# Patient Record
Sex: Male | Born: 1945
Health system: Southern US, Community
[De-identification: ages and names within clinical notes are randomized; demographics above are authoritative.]

## PROBLEM LIST (undated history)

## (undated) DIAGNOSIS — Z87442 Personal history of urinary calculi: Secondary | ICD-10-CM

## (undated) DIAGNOSIS — M1990 Unspecified osteoarthritis, unspecified site: Secondary | ICD-10-CM

## (undated) DIAGNOSIS — E278 Other specified disorders of adrenal gland: Secondary | ICD-10-CM

## (undated) DIAGNOSIS — I5030 Unspecified diastolic (congestive) heart failure: Secondary | ICD-10-CM

## (undated) DIAGNOSIS — C801 Malignant (primary) neoplasm, unspecified: Secondary | ICD-10-CM

## (undated) DIAGNOSIS — B0053 Herpesviral conjunctivitis: Secondary | ICD-10-CM

## (undated) DIAGNOSIS — N189 Chronic kidney disease, unspecified: Secondary | ICD-10-CM

## (undated) DIAGNOSIS — I10 Essential (primary) hypertension: Secondary | ICD-10-CM

## (undated) DIAGNOSIS — L309 Dermatitis, unspecified: Secondary | ICD-10-CM

## (undated) HISTORY — PX: HERNIA REPAIR: SHX51

## (undated) HISTORY — PX: TONSILLECTOMY: SUR1361

## (undated) SURGERY — Surgical Case
Anesthesia: *Unknown

---

## 1972-11-07 HISTORY — PX: PILONIDAL CYST EXCISION: SHX744

## 1984-11-07 HISTORY — PX: CORNEAL TRANSPLANT: SHX108

## 2011-02-24 ENCOUNTER — Other Ambulatory Visit: Payer: Self-pay | Admitting: Family Medicine

## 2011-02-24 DIAGNOSIS — R1032 Left lower quadrant pain: Secondary | ICD-10-CM

## 2011-03-01 ENCOUNTER — Ambulatory Visit
Admission: RE | Admit: 2011-03-01 | Discharge: 2011-03-01 | Disposition: A | Discharge status: Home / Self Care | Payer: Managed Care, Other (non HMO) | Source: Ambulatory Visit | Attending: Family Medicine | Admitting: Family Medicine

## 2011-03-01 DIAGNOSIS — R1032 Left lower quadrant pain: Secondary | ICD-10-CM

## 2011-03-01 MED ORDER — IOHEXOL 300 MG/ML  SOLN
100.0000 mL | Freq: Once | INTRAMUSCULAR | Status: AC | PRN
  Administered 2011-03-01: 100 mL via INTRAVENOUS

## 2011-11-17 DIAGNOSIS — I10 Essential (primary) hypertension: Secondary | ICD-10-CM | POA: Diagnosis not present

## 2011-11-17 DIAGNOSIS — M25569 Pain in unspecified knee: Secondary | ICD-10-CM | POA: Diagnosis not present

## 2011-11-26 DIAGNOSIS — IMO0002 Reserved for concepts with insufficient information to code with codable children: Secondary | ICD-10-CM | POA: Diagnosis not present

## 2011-11-26 DIAGNOSIS — M1710 Unilateral primary osteoarthritis, unspecified knee: Secondary | ICD-10-CM | POA: Diagnosis not present

## 2012-05-28 DIAGNOSIS — I10 Essential (primary) hypertension: Secondary | ICD-10-CM | POA: Diagnosis not present

## 2012-05-28 DIAGNOSIS — L509 Urticaria, unspecified: Secondary | ICD-10-CM | POA: Diagnosis not present

## 2012-07-30 DIAGNOSIS — M999 Biomechanical lesion, unspecified: Secondary | ICD-10-CM | POA: Diagnosis not present

## 2012-07-30 DIAGNOSIS — IMO0002 Reserved for concepts with insufficient information to code with codable children: Secondary | ICD-10-CM | POA: Diagnosis not present

## 2012-07-30 DIAGNOSIS — M5137 Other intervertebral disc degeneration, lumbosacral region: Secondary | ICD-10-CM | POA: Diagnosis not present

## 2012-07-31 DIAGNOSIS — IMO0002 Reserved for concepts with insufficient information to code with codable children: Secondary | ICD-10-CM | POA: Diagnosis not present

## 2012-07-31 DIAGNOSIS — M999 Biomechanical lesion, unspecified: Secondary | ICD-10-CM | POA: Diagnosis not present

## 2012-07-31 DIAGNOSIS — M5137 Other intervertebral disc degeneration, lumbosacral region: Secondary | ICD-10-CM | POA: Diagnosis not present

## 2012-08-01 DIAGNOSIS — M5137 Other intervertebral disc degeneration, lumbosacral region: Secondary | ICD-10-CM | POA: Diagnosis not present

## 2012-08-01 DIAGNOSIS — M999 Biomechanical lesion, unspecified: Secondary | ICD-10-CM | POA: Diagnosis not present

## 2012-08-01 DIAGNOSIS — IMO0002 Reserved for concepts with insufficient information to code with codable children: Secondary | ICD-10-CM | POA: Diagnosis not present

## 2012-08-02 DIAGNOSIS — IMO0002 Reserved for concepts with insufficient information to code with codable children: Secondary | ICD-10-CM | POA: Diagnosis not present

## 2012-08-02 DIAGNOSIS — M999 Biomechanical lesion, unspecified: Secondary | ICD-10-CM | POA: Diagnosis not present

## 2012-08-02 DIAGNOSIS — M5137 Other intervertebral disc degeneration, lumbosacral region: Secondary | ICD-10-CM | POA: Diagnosis not present

## 2012-08-06 DIAGNOSIS — IMO0002 Reserved for concepts with insufficient information to code with codable children: Secondary | ICD-10-CM | POA: Diagnosis not present

## 2012-08-06 DIAGNOSIS — M999 Biomechanical lesion, unspecified: Secondary | ICD-10-CM | POA: Diagnosis not present

## 2012-08-06 DIAGNOSIS — M5137 Other intervertebral disc degeneration, lumbosacral region: Secondary | ICD-10-CM | POA: Diagnosis not present

## 2012-08-09 DIAGNOSIS — IMO0002 Reserved for concepts with insufficient information to code with codable children: Secondary | ICD-10-CM | POA: Diagnosis not present

## 2012-08-09 DIAGNOSIS — M5137 Other intervertebral disc degeneration, lumbosacral region: Secondary | ICD-10-CM | POA: Diagnosis not present

## 2012-08-09 DIAGNOSIS — M999 Biomechanical lesion, unspecified: Secondary | ICD-10-CM | POA: Diagnosis not present

## 2012-08-13 DIAGNOSIS — IMO0002 Reserved for concepts with insufficient information to code with codable children: Secondary | ICD-10-CM | POA: Diagnosis not present

## 2012-08-13 DIAGNOSIS — M5137 Other intervertebral disc degeneration, lumbosacral region: Secondary | ICD-10-CM | POA: Diagnosis not present

## 2012-08-13 DIAGNOSIS — M999 Biomechanical lesion, unspecified: Secondary | ICD-10-CM | POA: Diagnosis not present

## 2012-08-16 DIAGNOSIS — IMO0002 Reserved for concepts with insufficient information to code with codable children: Secondary | ICD-10-CM | POA: Diagnosis not present

## 2012-08-16 DIAGNOSIS — M999 Biomechanical lesion, unspecified: Secondary | ICD-10-CM | POA: Diagnosis not present

## 2012-08-16 DIAGNOSIS — M5137 Other intervertebral disc degeneration, lumbosacral region: Secondary | ICD-10-CM | POA: Diagnosis not present

## 2012-08-20 DIAGNOSIS — M999 Biomechanical lesion, unspecified: Secondary | ICD-10-CM | POA: Diagnosis not present

## 2012-08-20 DIAGNOSIS — M5137 Other intervertebral disc degeneration, lumbosacral region: Secondary | ICD-10-CM | POA: Diagnosis not present

## 2012-08-20 DIAGNOSIS — IMO0002 Reserved for concepts with insufficient information to code with codable children: Secondary | ICD-10-CM | POA: Diagnosis not present

## 2013-01-22 IMAGING — CT CT PELVIS W/ CM
2 series · 15 of 36 positions shown, 19 images · IV contrast (READICAT & [ID] OMNI 300)
Comparison: None

CLINICAL DATA: Left inguinal pain.  History of previous hernia
repairs.

CT PELVIS WITH CONTRAST
TECHNIQUE: Multidetector CT imaging of the pelvis was performed
using the standard protocol following the bolus administration of
intravenous contrast.
Contrast:  100 ml [id number].

[Series 2: routine pelvis · axial · 0.78mm/px · z∈[-271,-11]mm · 14 of 57 slices shown, 17 images]
[im 4/57  soft-tissue]
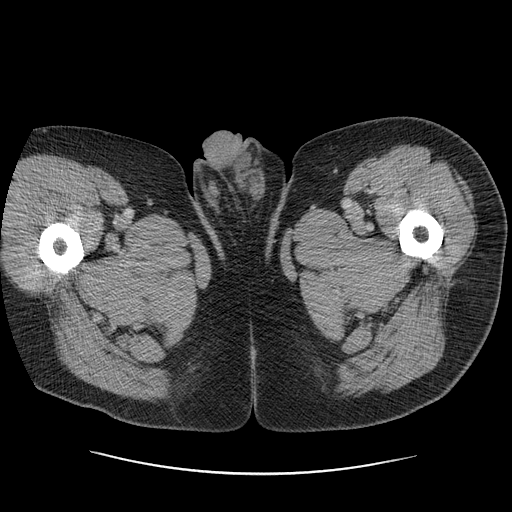
[im 4/57  bone]
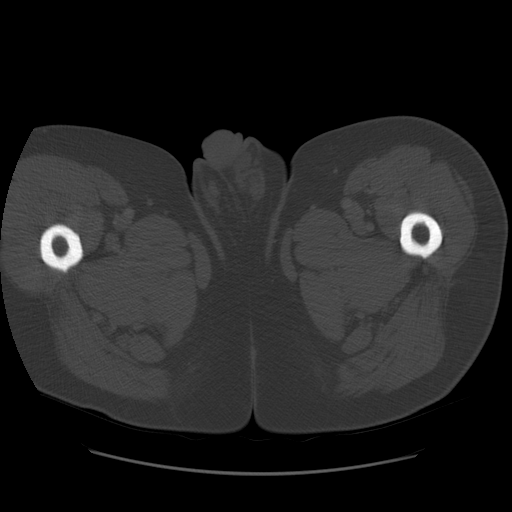
[im 10/57  soft-tissue]
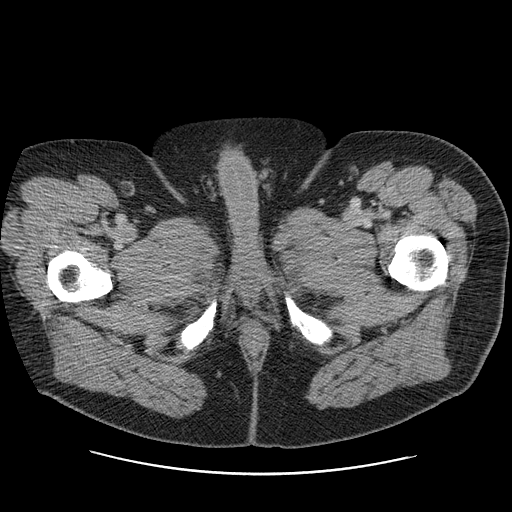
[im 13/57  soft-tissue]
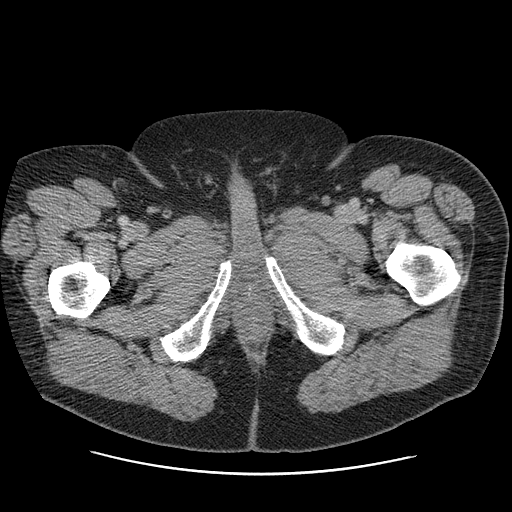
[im 19/57  soft-tissue]
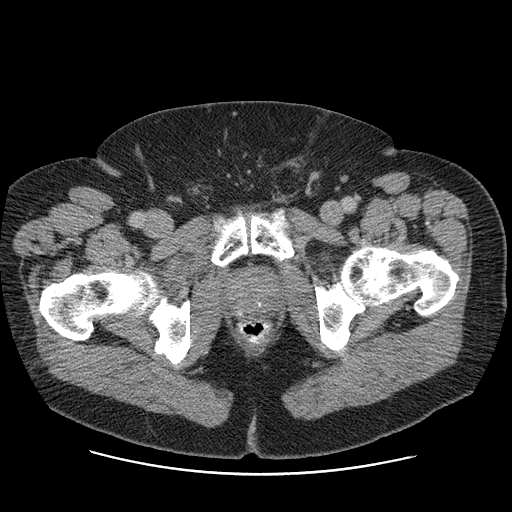
[im 24/57  soft-tissue]
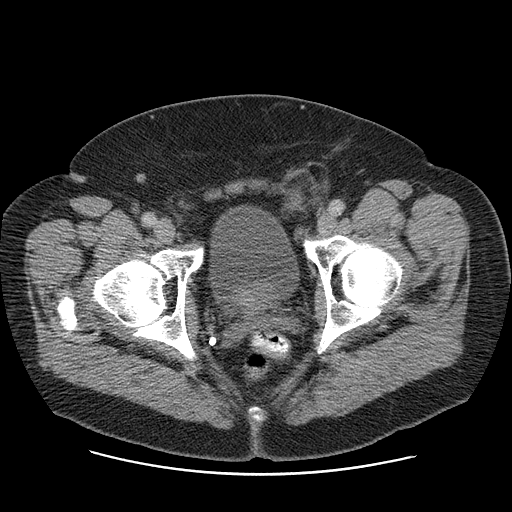
[im 29/57  soft-tissue]
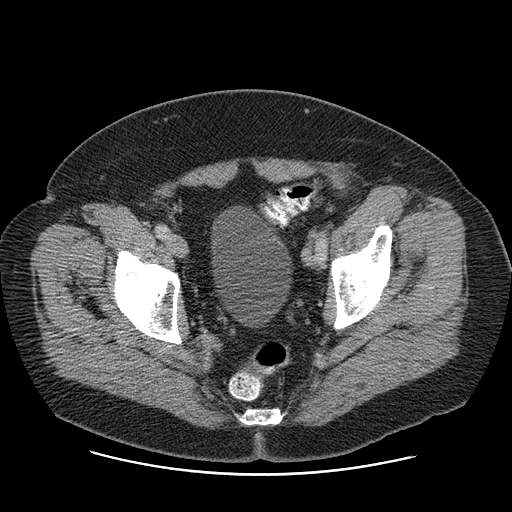
[im 33/57  soft-tissue]
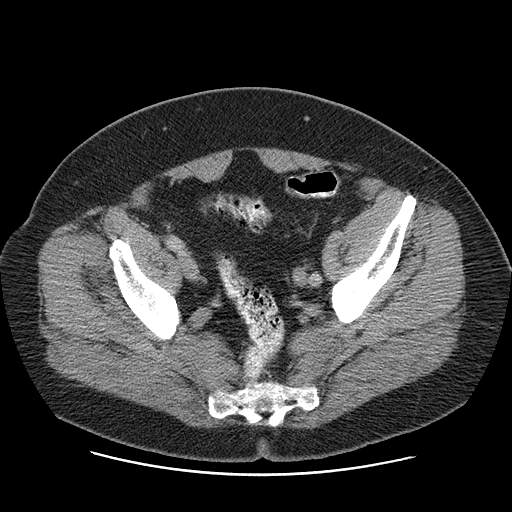
[im 38/57  soft-tissue]
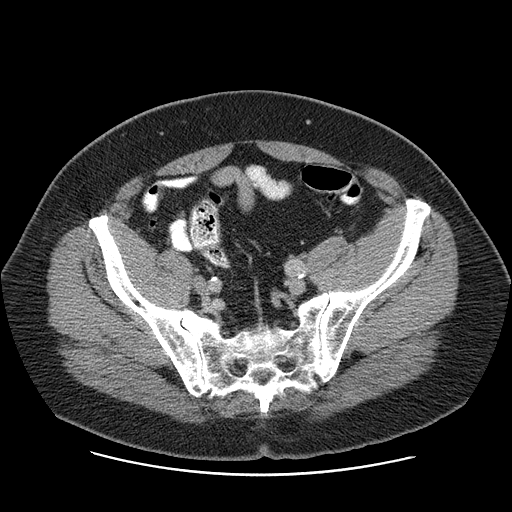
[im 44/57  soft-tissue]
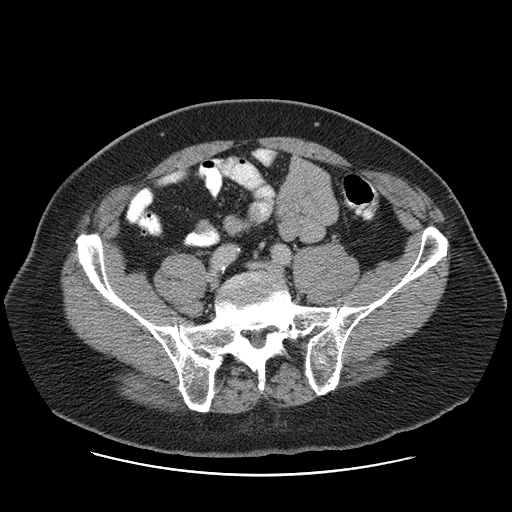
[im 44/57  bone]
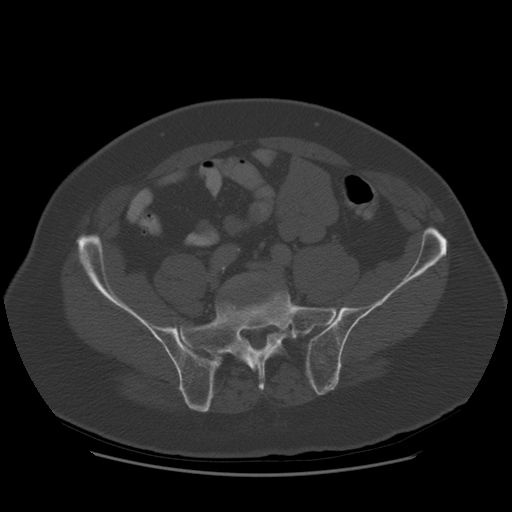
[im 47/57  soft-tissue]
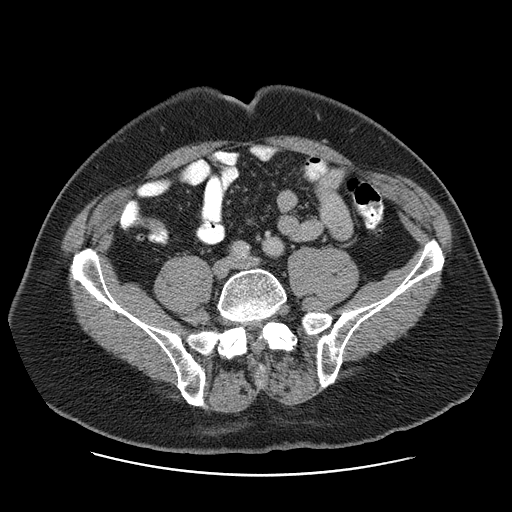
[im 49/57  lung]
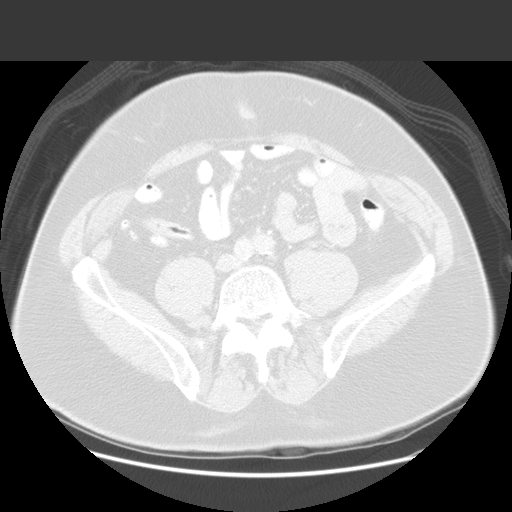
[im 51/57  lung]
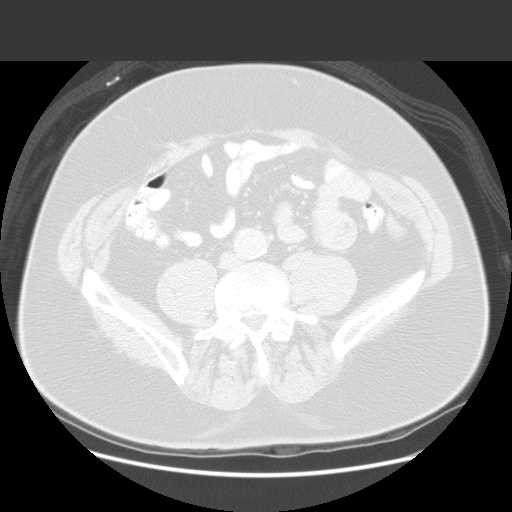
[im 53/57  soft-tissue]
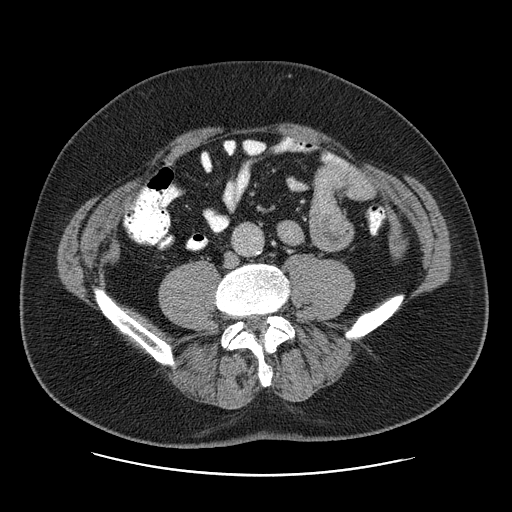
[im 53/57  lung]
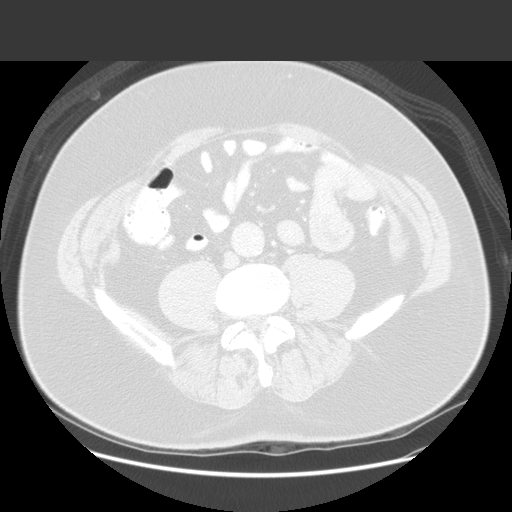
[im 55/57  lung]
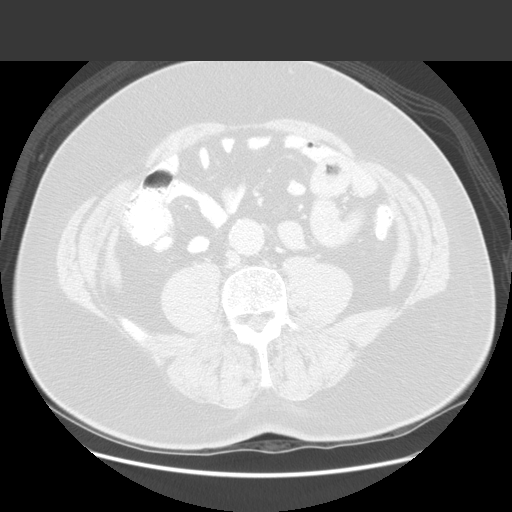

[Series 301: sagittal · sagittal · 0.78mm/px · 1 of 157 slices shown, 2 images]
[im 53/157  soft-tissue]
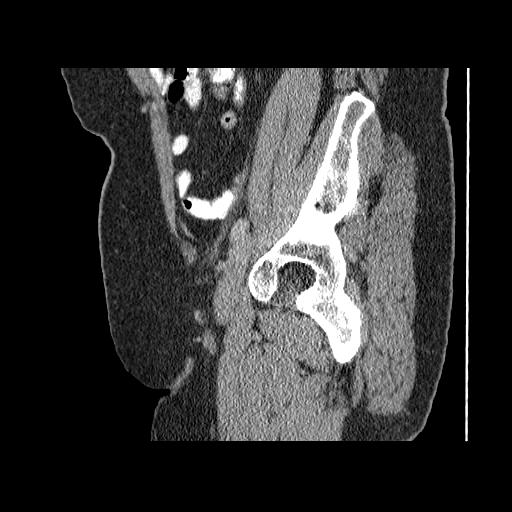
[im 53/157  bone]
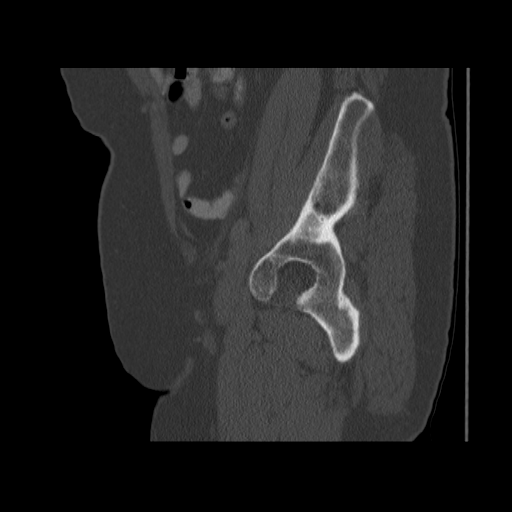

[15 of 36 positions shown; findings below may reference images not displayed]

FINDINGS: There is a left inguinal hernia which contains fat and
possibly some inflammatory change.  There is a small density
adjacent to the hernia which could be a sigmoid colon diverticula
with contrast in a or postoperative change with calcification.

The rectum, sigmoid colon visualized small bowel loops are
otherwise unremarkable.  The appendix is normal.  The bladder is
unremarkable.  No pelvic mass or adenopathy.

No significant bony findings.
IMPRESSION: 1.  Left inguinal hernia containing fat and possibly some
inflammatory changes.
2.  Soft tissue density near the mouth of the hernia could be
postoperative change or sigmoid diverticula.
3.  No pelvic masses or adenopathy.

## 2013-02-14 DIAGNOSIS — R21 Rash and other nonspecific skin eruption: Secondary | ICD-10-CM | POA: Diagnosis not present

## 2013-02-14 DIAGNOSIS — Z23 Encounter for immunization: Secondary | ICD-10-CM | POA: Diagnosis not present

## 2013-02-14 DIAGNOSIS — I10 Essential (primary) hypertension: Secondary | ICD-10-CM | POA: Diagnosis not present

## 2013-11-01 DIAGNOSIS — M79609 Pain in unspecified limb: Secondary | ICD-10-CM | POA: Diagnosis not present

## 2013-11-01 DIAGNOSIS — Z125 Encounter for screening for malignant neoplasm of prostate: Secondary | ICD-10-CM | POA: Diagnosis not present

## 2014-04-09 DIAGNOSIS — M25559 Pain in unspecified hip: Secondary | ICD-10-CM | POA: Diagnosis not present

## 2014-04-09 DIAGNOSIS — I10 Essential (primary) hypertension: Secondary | ICD-10-CM | POA: Diagnosis not present

## 2014-04-09 DIAGNOSIS — Z23 Encounter for immunization: Secondary | ICD-10-CM | POA: Diagnosis not present

## 2014-04-15 ENCOUNTER — Other Ambulatory Visit: Payer: Self-pay | Admitting: Surgical

## 2014-05-13 ENCOUNTER — Other Ambulatory Visit: Payer: Self-pay | Admitting: Surgical

## 2014-05-13 NOTE — H&P (Signed)
TOTAL HIP ADMISSION H&P  Patient is admitted for right total hip arthroplasty.  Subjective:  Chief Complaint: right hip pain  HPI: [name] [name], 68 y.o. male, has a history of pain and functional disability in the right hip(s) due to arthritis and patient has failed non-surgical conservative treatments for greater than 12 weeks to include NSAID's and/or analgesics, flexibility and strengthening excercises and activity modification.  Onset of symptoms was gradual starting 1 year ago with gradually worsening course since that time.The patient noted no past surgery on the right hip(s).  Patient currently rates pain in the right hip at 6 out of 10 with activity. Patient has night pain, worsening of pain with activity and weight bearing, pain that interfers with activities of daily living and pain with passive range of motion. Patient has evidence of periarticular osteophytes and joint space narrowing by imaging studies. This condition presents safety issues increasing the risk of falls. There is no current active infection.   Past Medical History Impaired Vision Hypertension Osteoarthritis Peripheral edema Eczema  Past Surgical History Left inguinal hernia repair, 3x Right inguinal hernia repair Tonsillectomy Pilonidal cyst removal Right corneal transplant  Medications Losartan 50mg  PO bid Indomethacin 50mg  PO bid Furosemide 20mg  PO bid PRN Hydrocodone/APAP 5-325mg  PO q4h PRN  Allergies PCN: causes rash  History  Substance Use Topics  . Smoking status: Former smoker  . Smokeless tobacco: None  . Alcohol Use: None    Family History Cancer Alzheimer's disease   Review of Systems  Constitutional: Negative.   HENT: Negative.   Eyes: Negative.   Respiratory: Negative.   Cardiovascular: Negative.   Gastrointestinal: Negative.   Genitourinary: Negative.   Musculoskeletal: Positive for back pain, joint pain and myalgias. Negative for falls and neck pain.       Right hip pain   Skin: Negative.   Neurological: Negative.   Endo/Heme/Allergies: Negative.   Psychiatric/Behavioral: Negative.     Objective:  Physical Exam  Constitutional: He is oriented to person, place, and time. He appears well-developed and well-nourished. No distress.  HENT:  Head: Normocephalic and atraumatic.  Right Ear: External ear normal.  Left Ear: External ear normal.  Nose: Nose normal.  Mouth/Throat: Oropharynx is clear and moist.  Eyes: Conjunctivae are normal.  EOM intact in left eye. Blind in right eye  Neck: Normal range of motion. Neck supple.  Cardiovascular: Normal rate, regular rhythm, normal heart sounds and intact distal pulses.   No murmur heard. Respiratory: Effort normal and breath sounds normal. No respiratory distress. He has no wheezes.  GI: He exhibits no distension. There is no tenderness.  Musculoskeletal:       Right hip: He exhibits decreased range of motion, decreased strength and crepitus.       Left hip: Normal.       Right knee: He exhibits decreased range of motion and swelling. He exhibits no erythema. Tenderness found. Medial joint line tenderness noted. No lateral joint line tenderness noted.       Left knee: He exhibits decreased range of motion and swelling. He exhibits no erythema. Tenderness found. Medial joint line tenderness noted. No lateral joint line tenderness noted.       Right lower leg: He exhibits no tenderness and no swelling.       Left lower leg: He exhibits no tenderness and no swelling.  Neurological: He is alert and oriented to person, place, and time. He has normal strength. No sensory deficit.  Skin: No rash noted. He is not  diaphoretic. No erythema.  Psychiatric: He has a normal mood and affect. His behavior is normal.    Vitals  Weight: 260 lb Height: 72in Body Surface Area: 2.45 m Body Mass Index: 35.26 kg/m Pulse: 68 (Regular)  BP: 150/70 (Sitting, Left Arm, Standard)  Imaging Review Plain radiographs  demonstrate severe degenerative joint disease of the right hip(s). The bone quality appears to be good for age and reported activity level.  Assessment/Plan:  End stage arthritis, right hip(s)  The patient history, physical examination, clinical judgement of the provider and imaging studies are consistent with end stage degenerative joint disease of the right hip(s) and total hip arthroplasty is deemed medically necessary. The treatment options including medical management, injection therapy, arthroscopy and arthroplasty were discussed at length. The risks and benefits of total hip arthroplasty were presented and reviewed. The risks due to aseptic loosening, infection, stiffness, dislocation/subluxation,  thromboembolic complications and other imponderables were discussed.  The patient acknowledged the explanation, agreed to proceed with the plan and consent was signed. Patient is being admitted for inpatient treatment for surgery, pain control, PT, OT, prophylactic antibiotics, VTE prophylaxis, progressive ambulation and ADL's and discharge planning.The patient is planning to be discharged home with home health services    [place], [place]

## 2014-05-15 ENCOUNTER — Other Ambulatory Visit (HOSPITAL_COMMUNITY): Payer: Self-pay | Admitting: *Deleted

## 2014-05-15 NOTE — Patient Instructions (Addendum)
[name] [name]  05/15/2014                           YOUR PROCEDURE IS SCHEDULED ON: 05/22/14 AT 7:30 AM               ENTER [facility] ENTRANCE AND                            FOLLOW  SIGNS TO SHORT STAY CENTER                 ARRIVE AT SHORT STAY AT: 5:30 AM               CALL THIS NUMBER IF ANY PROBLEMS THE DAY OF SURGERY :               [phone]                                REMEMBER:   Do not eat food or drink liquids AFTER MIDNIGHT                  Take these medicines the morning of surgery with               A SIPS OF WATER : VICODIN         Do not wear jewelry, make-up   Do not wear lotions, powders, or perfumes.   Do not shave legs or underarms 12 hrs. before surgery (men may shave face)  Do not bring valuables to the hospital.  Contacts, dentures or bridgework may not be worn into surgery.  Leave suitcase in the car. After surgery it may be brought to your room.  For patients admitted to the hospital more than one night, checkout time is            11:00 AM                                                       ________________________________________________________________________                                                                        [place]  Before surgery, you can play an important role.  Because skin is not sterile, your skin needs to be as free of germs as possible.  You can reduce the number of germs on your skin by washing with CHG (chlorahexidine gluconate) soap before surgery.  CHG is an antiseptic cleaner which kills germs and bonds with the skin to continue killing germs even after washing. Please DO NOT use if you have an allergy to CHG or antibacterial soaps.  If your skin becomes reddened/irritated stop using the CHG and inform your nurse when you arrive at Short Stay. Do not shave (including legs and underarms) for at least 48 hours prior to the first CHG shower.  You may  shave your face. Please follow these instructions carefully:   1.  Shower with CHG Soap the night before surgery and the  morning of Surgery.   2.  If you choose to wash your hair, wash your hair first as usual with your  normal  Shampoo.   3.  After you shampoo, rinse your hair and body thoroughly to remove the  shampoo.                                         4.  Use CHG as you would any other liquid soap.  You can apply chg directly  to the skin and wash . Gently wash with scrungie or clean wascloth    5.  Apply the CHG Soap to your body ONLY FROM THE NECK DOWN.   Do not use on open                           Wound or open sores. Avoid contact with eyes, ears mouth and genitals (private parts).                        Genitals (private parts) with your normal soap.              6.  Wash thoroughly, paying special attention to the area where your surgery  will be performed.   7.  Thoroughly rinse your body with warm water from the neck down.   8.  DO NOT shower/wash with your normal soap after using and rinsing off  the CHG Soap .                9.  Pat yourself dry with a clean towel.             10.  Wear clean pajamas.             11.  Place clean sheets on your bed the night of your first shower and do not  sleep with pets.  Day of Surgery : Do not apply any lotions/deodorants the morning of surgery.  Please wear clean clothes to the hospital/surgery center.  FAILURE TO FOLLOW THESE INSTRUCTIONS MAY RESULT IN THE CANCELLATION OF YOUR SURGERY    PATIENT SIGNATURE_________________________________  ______________________________________________________________________     [name] [name]  An incentive spirometer is a tool that can help keep your lungs clear and active. This tool measures how well you are filling your lungs with each breath. Taking long deep breaths may help reverse or decrease the chance of developing breathing (pulmonary) problems (especially  infection) following:  A long period of time when you are unable to move or be active. BEFORE THE PROCEDURE   If the spirometer includes an indicator to show your best effort, your nurse or respiratory therapist will set it to a desired goal.  If possible, sit up straight or lean slightly forward. Try not to slouch.  Hold the incentive spirometer in an upright position. INSTRUCTIONS FOR USE  1. Sit on the edge of your bed if possible, or sit up as far as you can in bed or on a chair. 2. Hold the incentive spirometer in an upright position. 3. Breathe out normally. 4. Place the mouthpiece in your mouth and seal your lips tightly around it. 5. Breathe in slowly and as deeply as  possible, raising the piston or the ball toward the top of the column. 6. Hold your breath for 3-5 seconds or for as long as possible. Allow the piston or ball to fall to the bottom of the column. 7. Remove the mouthpiece from your mouth and breathe out normally. 8. Rest for a few seconds and repeat Steps 1 through 7 at least 10 times every 1-2 hours when you are awake. Take your time and take a few normal breaths between deep breaths. 9. The spirometer may include an indicator to show your best effort. Use the indicator as a goal to work toward during each repetition. 10. After each set of 10 deep breaths, practice coughing to be sure your lungs are clear. If you have an incision (the cut made at the time of surgery), support your incision when coughing by placing a pillow or rolled up towels firmly against it. Once you are able to get out of bed, walk around indoors and cough well. You may stop using the incentive spirometer when instructed by your caregiver.  RISKS AND COMPLICATIONS  Take your time so you do not get dizzy or light-headed.  If you are in pain, you may need to take or ask for pain medication before doing incentive spirometry. It is harder to take a deep breath if you are having pain. AFTER  USE  Rest and breathe slowly and easily.  It can be helpful to keep track of a log of your progress. Your caregiver can provide you with a simple table to help with this. If you are using the spirometer at home, follow these instructions: [place] IF:   You are having difficultly using the spirometer.  You have trouble using the spirometer as often as instructed.  Your pain medication is not giving enough relief while using the spirometer.  You develop fever of 100.5 F (38.1 C) or higher. SEEK IMMEDIATE MEDICAL CARE IF:   You cough up bloody sputum that had not been present before.  You develop fever of 102 F (38.9 C) or greater.  You develop worsening pain at or near the incision site. MAKE SURE YOU:   Understand these instructions.  Will watch your condition.  Will get help right away if you are not doing well or get worse. Document Released: 03/06/2007 Document Revised: 01/16/2012 Document Reviewed: 05/07/2007 Exit[employer] Patient Information 2014 [employer], [place].   ________________________________________________________________________  WHAT IS A BLOOD TRANSFUSION? Blood Transfusion Information  A transfusion is the replacement of blood or some of its parts. Blood is made up of multiple cells which provide different functions.  Red blood cells carry oxygen and are used for blood loss replacement.  White blood cells fight against infection.  Platelets control bleeding.  Plasma helps clot blood.  Other blood products are available for specialized needs, such as hemophilia or other clotting disorders. BEFORE THE TRANSFUSION  Who gives blood for transfusions?   Healthy volunteers who are fully evaluated to make sure their blood is safe. This is blood bank blood. Transfusion therapy is the safest it has ever been in the practice of medicine. Before blood is taken from a donor, a complete history is taken to make sure that person has no history of diseases  nor engages in risky social behavior (examples are intravenous drug use or sexual activity with multiple partners). The donor's travel history is screened to minimize risk of transmitting infections, such as malaria. The donated blood is tested for signs of infectious diseases, such  as HIV and hepatitis. The blood is then tested to be sure it is compatible with you in order to minimize the chance of a transfusion reaction. If you or a relative donates blood, this is often done in anticipation of surgery and is not appropriate for emergency situations. It takes many days to process the donated blood. RISKS AND COMPLICATIONS Although transfusion therapy is very safe and saves many lives, the main dangers of transfusion include:   Getting an infectious disease.  Developing a transfusion reaction. This is an allergic reaction to something in the blood you were given. Every precaution is taken to prevent this. The decision to have a blood transfusion has been considered carefully by your caregiver before blood is given. Blood is not given unless the benefits outweigh the risks. AFTER THE TRANSFUSION  Right after receiving a blood transfusion, you will usually feel much better and more energetic. This is especially true if your red blood cells have gotten low (anemic). The transfusion raises the level of the red blood cells which carry oxygen, and this usually causes an energy increase.  The nurse administering the transfusion will monitor you carefully for complications. HOME CARE INSTRUCTIONS  No special instructions are needed after a transfusion. You may find your energy is better. Speak with your caregiver about any limitations on activity for underlying diseases you may have. SEEK MEDICAL CARE IF:   Your condition is not improving after your transfusion.  You develop redness or irritation at the intravenous (IV) site. SEEK IMMEDIATE MEDICAL CARE IF:  Any of the following symptoms occur over the  next 12 hours:  Shaking chills.  You have a temperature by mouth above 102 F (38.9 C), not controlled by medicine.  Chest, back, or muscle pain.  People around you feel you are not acting correctly or are confused.  Shortness of breath or difficulty breathing.  Dizziness and fainting.  You get a rash or develop hives.  You have a decrease in urine output.  Your urine turns a dark color or changes to pink, red, or brown. Any of the following symptoms occur over the next 10 days:  You have a temperature by mouth above 102 F (38.9 C), not controlled by medicine.  Shortness of breath.  Weakness after normal activity.  The white part of the eye turns yellow (jaundice).  You have a decrease in the amount of urine or are urinating less often.  Your urine turns a dark color or changes to pink, red, or brown. Document Released: 10/21/2000 Document Revised: 01/16/2012 Document Reviewed: 06/09/2008 [facility] Patient Information 2014 [place], [place].  _______________________________________________________________________

## 2014-05-16 ENCOUNTER — Ambulatory Visit (HOSPITAL_COMMUNITY)
Admission: RE | Admit: 2014-05-16 | Discharge: 2014-05-16 | Disposition: A | Discharge status: Home / Self Care | Payer: Medicare Other | Source: Ambulatory Visit | Attending: Surgical | Admitting: Surgical

## 2014-05-16 ENCOUNTER — Encounter (INDEPENDENT_AMBULATORY_CARE_PROVIDER_SITE_OTHER): Payer: Self-pay

## 2014-05-16 ENCOUNTER — Encounter (HOSPITAL_COMMUNITY)
Admission: RE | Admit: 2014-05-16 | Discharge: 2014-05-16 | Disposition: A | Discharge status: Home / Self Care | Payer: Medicare Other | Source: Ambulatory Visit | Attending: Orthopedic Surgery | Admitting: Orthopedic Surgery

## 2014-05-16 ENCOUNTER — Encounter (HOSPITAL_COMMUNITY): Payer: Self-pay | Admitting: Pharmacy Technician

## 2014-05-16 ENCOUNTER — Encounter (HOSPITAL_COMMUNITY): Payer: Self-pay

## 2014-05-16 DIAGNOSIS — I10 Essential (primary) hypertension: Secondary | ICD-10-CM | POA: Diagnosis not present

## 2014-05-16 HISTORY — DX: Dermatitis, unspecified: L30.9

## 2014-05-16 HISTORY — DX: Personal history of urinary calculi: Z87.442

## 2014-05-16 HISTORY — DX: Herpesviral conjunctivitis: B00.53

## 2014-05-16 HISTORY — DX: Unspecified osteoarthritis, unspecified site: M19.90

## 2014-05-16 HISTORY — DX: Other specified disorders of adrenal gland: E27.8

## 2014-05-16 LAB — SURGICAL PCR SCREEN
MRSA, PCR: NEGATIVE
Staphylococcus aureus: NEGATIVE

## 2014-05-16 LAB — URINALYSIS, ROUTINE W REFLEX MICROSCOPIC
Bilirubin Urine: NEGATIVE
Glucose, UA: NEGATIVE mg/dL
Ketones, ur: NEGATIVE mg/dL
Leukocytes, UA: NEGATIVE
Nitrite: NEGATIVE
Protein, ur: NEGATIVE mg/dL
Specific Gravity, Urine: 1.024 (ref 1.005–1.030)
Urobilinogen, UA: 0.2 mg/dL (ref 0.0–1.0)
pH: 5.5 (ref 5.0–8.0)

## 2014-05-16 LAB — PROTIME-INR
INR: 0.95 (ref 0.00–1.49)
Prothrombin Time: 12.7 seconds (ref 11.6–15.2)

## 2014-05-16 LAB — CBC
HCT: 36.6 % — ABNORMAL LOW (ref 39.0–52.0)
Hemoglobin: 12.2 g/dL — ABNORMAL LOW (ref 13.0–17.0)
MCH: 30.8 pg (ref 26.0–34.0)
MCHC: 33.3 g/dL (ref 30.0–36.0)
MCV: 92.4 fL (ref 78.0–100.0)
Platelets: 147 10*3/uL — ABNORMAL LOW (ref 150–400)
RBC: 3.96 MIL/uL — ABNORMAL LOW (ref 4.22–5.81)
RDW: 14.1 % (ref 11.5–15.5)
WBC: 6.9 10*3/uL (ref 4.0–10.5)

## 2014-05-16 LAB — COMPREHENSIVE METABOLIC PANEL
ALT: 28 U/L (ref 0–53)
AST: 21 U/L (ref 0–37)
Albumin: 3.9 g/dL (ref 3.5–5.2)
Alkaline Phosphatase: 67 U/L (ref 39–117)
Anion gap: 10 (ref 5–15)
BUN: 23 mg/dL (ref 6–23)
CO2: 26 mEq/L (ref 19–32)
Calcium: 9.8 mg/dL (ref 8.4–10.5)
Chloride: 106 mEq/L (ref 96–112)
Creatinine, Ser: 0.94 mg/dL (ref 0.50–1.35)
GFR calc Af Amer: >90 mL/min (ref >90)
GFR calc non Af Amer: 84 mL/min — ABNORMAL LOW (ref >90)
Glucose, Bld: 95 mg/dL (ref 70–99)
Potassium: 4.7 mEq/L (ref 3.7–5.3)
Sodium: 142 mEq/L (ref 137–147)
Total Bilirubin: 0.6 mg/dL (ref 0.3–1.2)
Total Protein: 6.9 g/dL (ref 6.0–8.3)

## 2014-05-16 LAB — APTT: aPTT: 25 seconds (ref 24–37)

## 2014-05-16 LAB — URINE MICROSCOPIC-ADD ON

## 2014-05-16 NOTE — Progress Notes (Signed)
UA routed to Dr. Gladstone Lighter

## 2014-05-16 NOTE — Progress Notes (Signed)
05/16/14 1350  OBSTRUCTIVE SLEEP APNEA  Have you ever been diagnosed with sleep apnea through a sleep study? No  Do you snore loudly (loud enough to be heard through closed doors)?  0  Do you often feel tired, fatigued, or sleepy during the daytime? 0  Has anyone observed you stop breathing during your sleep? 0  Do you have, or are you being treated for high blood pressure? 1  BMI more than 35 kg/m2? 0  Age over 68 years old? 1  Neck circumference greater than 40 cm/16 inches? 1  Gender: 1  Obstructive Sleep Apnea Score 4  Score 4 or greater  Results sent to PCP

## 2014-05-21 NOTE — Anesthesia Preprocedure Evaluation (Addendum)
Anesthesia Evaluation  Patient identified by MRN, date of birth, ID band Patient awake    Reviewed: Allergy & Precautions, H&P , NPO status , Patient's Chart, lab work & pertinent test results  Airway Mallampati: II TM Distance: >3 FB Neck ROM: full    Dental  (+) Chipped, Dental Advisory Given Right upper front broken off:   Pulmonary neg pulmonary ROS, former smoker,  breath sounds clear to auscultation  Pulmonary exam normal       Cardiovascular Exercise Tolerance: Good hypertension, Pt. on medications negative cardio ROS  Rhythm:regular Rate:Normal     Neuro/Psych negative neurological ROS  negative psych ROS   GI/Hepatic negative GI ROS, Neg liver ROS,   Endo/Other  negative endocrine ROS  Renal/GU negative Renal ROS  negative genitourinary   Musculoskeletal   Abdominal (+) + obese,   Peds  Hematology negative hematology ROS (+)   Anesthesia Other Findings   Reproductive/Obstetrics negative OB ROS                       Anesthesia Physical Anesthesia Plan  ASA: II  Anesthesia Plan: General   Post-op Pain Management:    Induction: Intravenous  Airway Management Planned: Oral ETT  Additional Equipment:   Intra-op Plan:   Post-operative Plan: Extubation in OR  Informed Consent: I have reviewed the patients History and Physical, chart, labs and discussed the procedure including the risks, benefits and alternatives for the proposed anesthesia with the patient or authorized representative who has indicated his/her understanding and acceptance.   Dental Advisory Given and Dental advisory given  Plan Discussed with: CRNA and Surgeon  Anesthesia Plan Comments:        Anesthesia Quick Evaluation

## 2014-05-22 ENCOUNTER — Encounter (HOSPITAL_COMMUNITY): Payer: Self-pay | Admitting: *Deleted

## 2014-05-22 ENCOUNTER — Inpatient Hospital Stay (HOSPITAL_COMMUNITY)
Admission: RE | Admit: 2014-05-22 | Discharge: 2014-05-24 | DRG: 470 | Disposition: A | Discharge status: Skilled Nursing Facility | Payer: Medicare Other | Source: Ambulatory Visit | Attending: Orthopedic Surgery | Admitting: Orthopedic Surgery

## 2014-05-22 ENCOUNTER — Inpatient Hospital Stay (HOSPITAL_COMMUNITY): Payer: Medicare Other

## 2014-05-22 ENCOUNTER — Encounter (HOSPITAL_COMMUNITY): Payer: Medicare Other | Admitting: Anesthesiology

## 2014-05-22 ENCOUNTER — Inpatient Hospital Stay (HOSPITAL_COMMUNITY): Payer: Medicare Other | Admitting: Anesthesiology

## 2014-05-22 ENCOUNTER — Encounter (HOSPITAL_COMMUNITY)
Admission: RE | Disposition: A | Discharge status: Skilled Nursing Facility | Payer: Self-pay | Source: Ambulatory Visit | Attending: Orthopedic Surgery

## 2014-05-22 DIAGNOSIS — Z880 Allergy status to penicillin: Secondary | ICD-10-CM | POA: Diagnosis not present

## 2014-05-22 DIAGNOSIS — Z96649 Presence of unspecified artificial hip joint: Secondary | ICD-10-CM | POA: Diagnosis not present

## 2014-05-22 DIAGNOSIS — Z87442 Personal history of urinary calculi: Secondary | ICD-10-CM

## 2014-05-22 DIAGNOSIS — M169 Osteoarthritis of hip, unspecified: Principal | ICD-10-CM | POA: Diagnosis present

## 2014-05-22 DIAGNOSIS — Z87891 Personal history of nicotine dependence: Secondary | ICD-10-CM

## 2014-05-22 DIAGNOSIS — D62 Acute posthemorrhagic anemia: Secondary | ICD-10-CM | POA: Diagnosis not present

## 2014-05-22 DIAGNOSIS — M1610 Unilateral primary osteoarthritis, unspecified hip: Secondary | ICD-10-CM | POA: Diagnosis present

## 2014-05-22 DIAGNOSIS — Z6834 Body mass index (BMI) 34.0-34.9, adult: Secondary | ICD-10-CM | POA: Diagnosis not present

## 2014-05-22 DIAGNOSIS — E669 Obesity, unspecified: Secondary | ICD-10-CM | POA: Diagnosis present

## 2014-05-22 DIAGNOSIS — M1611 Unilateral primary osteoarthritis, right hip: Secondary | ICD-10-CM | POA: Diagnosis present

## 2014-05-22 DIAGNOSIS — M25559 Pain in unspecified hip: Secondary | ICD-10-CM | POA: Diagnosis not present

## 2014-05-22 DIAGNOSIS — Z96641 Presence of right artificial hip joint: Secondary | ICD-10-CM

## 2014-05-22 DIAGNOSIS — Z471 Aftercare following joint replacement surgery: Secondary | ICD-10-CM | POA: Diagnosis not present

## 2014-05-22 DIAGNOSIS — H5440 Blindness, one eye, unspecified eye: Secondary | ICD-10-CM | POA: Diagnosis present

## 2014-05-22 DIAGNOSIS — I10 Essential (primary) hypertension: Secondary | ICD-10-CM | POA: Diagnosis present

## 2014-05-22 DIAGNOSIS — M1990 Unspecified osteoarthritis, unspecified site: Secondary | ICD-10-CM | POA: Diagnosis not present

## 2014-05-22 HISTORY — PX: TOTAL HIP ARTHROPLASTY: SHX124

## 2014-05-22 LAB — ABO/RH: ABO/RH(D): A POS

## 2014-05-22 LAB — TYPE AND SCREEN
ABO/RH(D): A POS
Antibody Screen: NEGATIVE

## 2014-05-22 SURGERY — ARTHROPLASTY, HIP, TOTAL,POSTERIOR APPROACH
Anesthesia: General | Site: Hip | Laterality: Right

## 2014-05-22 MED ORDER — RIVAROXABAN 10 MG PO TABS
10.0000 mg | ORAL_TABLET | Freq: Every day | ORAL | Status: DC
  Administered 2014-05-23 – 2014-05-24 (×2): 10 mg via ORAL
  Filled 2014-05-22 (×3): qty 1

## 2014-05-22 MED ORDER — CHLORHEXIDINE GLUCONATE 4 % EX LIQD: 60.0000 mL | Freq: Once | CUTANEOUS | Status: DC

## 2014-05-22 MED ORDER — ALUM & MAG HYDROXIDE-SIMETH 200-200-20 MG/5ML PO SUSP: 30.0000 mL | ORAL | Status: DC | PRN

## 2014-05-22 MED ORDER — BISACODYL 5 MG PO TBEC: 5.0000 mg | DELAYED_RELEASE_TABLET | Freq: Every day | ORAL | Status: DC | PRN

## 2014-05-22 MED ORDER — THROMBIN 5000 UNITS EX SOLR
CUTANEOUS | Status: DC | PRN
  Administered 2014-05-22: 5000 [IU] via TOPICAL

## 2014-05-22 MED ORDER — LACTATED RINGERS IV SOLN: INTRAVENOUS | Status: DC

## 2014-05-22 MED ORDER — LACTATED RINGERS IV SOLN
INTRAVENOUS | Status: DC
  Administered 2014-05-22: 17:00:00 via INTRAVENOUS
  Administered 2014-05-23: 100 mL via INTRAVENOUS
  Administered 2014-05-23 – 2014-05-24 (×2): via INTRAVENOUS

## 2014-05-22 MED ORDER — HYDROMORPHONE HCL PF 1 MG/ML IJ SOLN
INTRAMUSCULAR | Status: AC
  Filled 2014-05-22: qty 1

## 2014-05-22 MED ORDER — ROCURONIUM BROMIDE 100 MG/10ML IV SOLN
INTRAVENOUS | Status: AC
  Filled 2014-05-22: qty 1

## 2014-05-22 MED ORDER — FENTANYL CITRATE 0.05 MG/ML IJ SOLN
INTRAMUSCULAR | Status: DC | PRN
  Administered 2014-05-22: 50 ug via INTRAVENOUS
  Administered 2014-05-22: 100 ug via INTRAVENOUS
  Administered 2014-05-22 (×2): 50 ug via INTRAVENOUS

## 2014-05-22 MED ORDER — ATROPINE SULFATE 0.4 MG/ML IJ SOLN
INTRAMUSCULAR | Status: AC
  Filled 2014-05-22: qty 1

## 2014-05-22 MED ORDER — CLINDAMYCIN PHOSPHATE 600 MG/50ML IV SOLN
600.0000 mg | Freq: Four times a day (QID) | INTRAVENOUS | Status: AC
  Administered 2014-05-22: 600 mg via INTRAVENOUS
  Filled 2014-05-22 (×2): qty 50

## 2014-05-22 MED ORDER — CLINDAMYCIN PHOSPHATE 900 MG/50ML IV SOLN
INTRAVENOUS | Status: AC
  Filled 2014-05-22: qty 50

## 2014-05-22 MED ORDER — ONDANSETRON HCL 4 MG/2ML IJ SOLN
INTRAMUSCULAR | Status: AC
  Filled 2014-05-22: qty 2

## 2014-05-22 MED ORDER — HYDROMORPHONE HCL PF 1 MG/ML IJ SOLN
0.2500 mg | INTRAMUSCULAR | Status: DC | PRN
  Administered 2014-05-22 (×4): 0.5 mg via INTRAVENOUS

## 2014-05-22 MED ORDER — CLINDAMYCIN PHOSPHATE 900 MG/50ML IV SOLN
900.0000 mg | Freq: Once | INTRAVENOUS | Status: AC
  Administered 2014-05-22: 900 mg via INTRAVENOUS

## 2014-05-22 MED ORDER — MENTHOL 3 MG MT LOZG
1.0000 | LOZENGE | OROMUCOSAL | Status: DC | PRN
  Filled 2014-05-22: qty 9

## 2014-05-22 MED ORDER — EPHEDRINE SULFATE 50 MG/ML IJ SOLN
INTRAMUSCULAR | Status: AC
  Filled 2014-05-22: qty 1

## 2014-05-22 MED ORDER — FLEET ENEMA 7-19 GM/118ML RE ENEM: 1.0000 | ENEMA | Freq: Once | RECTAL | Status: AC | PRN

## 2014-05-22 MED ORDER — PHENYLEPHRINE 40 MCG/ML (10ML) SYRINGE FOR IV PUSH (FOR BLOOD PRESSURE SUPPORT)
PREFILLED_SYRINGE | INTRAVENOUS | Status: AC
  Filled 2014-05-22: qty 10

## 2014-05-22 MED ORDER — MIDAZOLAM HCL 2 MG/2ML IJ SOLN
INTRAMUSCULAR | Status: AC
  Filled 2014-05-22: qty 2

## 2014-05-22 MED ORDER — ACETAMINOPHEN 650 MG RE SUPP: 650.0000 mg | Freq: Four times a day (QID) | RECTAL | Status: DC | PRN

## 2014-05-22 MED ORDER — ONDANSETRON HCL 4 MG PO TABS: 4.0000 mg | ORAL_TABLET | Freq: Four times a day (QID) | ORAL | Status: DC | PRN

## 2014-05-22 MED ORDER — NEOSTIGMINE METHYLSULFATE 10 MG/10ML IV SOLN
INTRAVENOUS | Status: DC | PRN
  Administered 2014-05-22: 5 mg via INTRAVENOUS

## 2014-05-22 MED ORDER — PHENOL 1.4 % MT LIQD: 1.0000 | OROMUCOSAL | Status: DC | PRN

## 2014-05-22 MED ORDER — HYDROCODONE-ACETAMINOPHEN 5-325 MG PO TABS
1.0000 | ORAL_TABLET | ORAL | Status: DC | PRN
  Administered 2014-05-22 – 2014-05-23 (×2): 1 via ORAL
  Filled 2014-05-22 (×2): qty 1

## 2014-05-22 MED ORDER — BUPIVACAINE LIPOSOME 1.3 % IJ SUSP
20.0000 mL | Freq: Once | INTRAMUSCULAR | Status: AC
  Administered 2014-05-22: 20 mL
  Filled 2014-05-22: qty 20

## 2014-05-22 MED ORDER — ROCURONIUM BROMIDE 100 MG/10ML IV SOLN
INTRAVENOUS | Status: DC | PRN
  Administered 2014-05-22: 30 mg via INTRAVENOUS

## 2014-05-22 MED ORDER — FUROSEMIDE 20 MG PO TABS
20.0000 mg | ORAL_TABLET | Freq: Two times a day (BID) | ORAL | Status: DC | PRN
  Filled 2014-05-22: qty 1

## 2014-05-22 MED ORDER — SODIUM CHLORIDE 0.9 % IJ SOLN
INTRAMUSCULAR | Status: AC
  Filled 2014-05-22: qty 20

## 2014-05-22 MED ORDER — BUPIVACAINE LIPOSOME 1.3 % IJ SUSP
20.0000 mL | Freq: Once | INTRAMUSCULAR | Status: DC
  Filled 2014-05-22: qty 20

## 2014-05-22 MED ORDER — MIDAZOLAM HCL 5 MG/5ML IJ SOLN
INTRAMUSCULAR | Status: DC | PRN
  Administered 2014-05-22: 2 mg via INTRAVENOUS

## 2014-05-22 MED ORDER — GLYCOPYRROLATE 0.2 MG/ML IJ SOLN
INTRAMUSCULAR | Status: DC | PRN
  Administered 2014-05-22: .8 mg via INTRAVENOUS

## 2014-05-22 MED ORDER — THROMBIN 5000 UNITS EX SOLR
CUTANEOUS | Status: AC
Start: 2014-05-22 — End: 2014-05-22
  Filled 2014-05-22: qty 5000

## 2014-05-22 MED ORDER — FERROUS SULFATE 325 (65 FE) MG PO TABS
325.0000 mg | ORAL_TABLET | Freq: Three times a day (TID) | ORAL | Status: DC
  Administered 2014-05-23 – 2014-05-24 (×5): 325 mg via ORAL
  Filled 2014-05-22 (×9): qty 1

## 2014-05-22 MED ORDER — ONDANSETRON HCL 4 MG/2ML IJ SOLN
INTRAMUSCULAR | Status: DC | PRN
  Administered 2014-05-22: 4 mg via INTRAVENOUS

## 2014-05-22 MED ORDER — POLYETHYLENE GLYCOL 3350 17 G PO PACK: 17.0000 g | PACK | Freq: Every day | ORAL | Status: DC | PRN

## 2014-05-22 MED ORDER — PHENYLEPHRINE HCL 10 MG/ML IJ SOLN
INTRAMUSCULAR | Status: DC | PRN
  Administered 2014-05-22 (×2): 80 ug via INTRAVENOUS
  Administered 2014-05-22 (×2): 40 ug via INTRAVENOUS

## 2014-05-22 MED ORDER — METHOCARBAMOL 500 MG PO TABS
500.0000 mg | ORAL_TABLET | Freq: Four times a day (QID) | ORAL | Status: DC | PRN
  Administered 2014-05-23 – 2014-05-24 (×2): 500 mg via ORAL
  Filled 2014-05-22 (×2): qty 1

## 2014-05-22 MED ORDER — EPHEDRINE SULFATE 50 MG/ML IJ SOLN
INTRAMUSCULAR | Status: DC | PRN
  Administered 2014-05-22 (×4): 5 mg via INTRAVENOUS

## 2014-05-22 MED ORDER — LIDOCAINE HCL (CARDIAC) 20 MG/ML IV SOLN
INTRAVENOUS | Status: AC
  Filled 2014-05-22: qty 5

## 2014-05-22 MED ORDER — LIDOCAINE HCL (CARDIAC) 20 MG/ML IV SOLN
INTRAVENOUS | Status: DC | PRN
  Administered 2014-05-22: 100 mg via INTRAVENOUS

## 2014-05-22 MED ORDER — LOSARTAN POTASSIUM 50 MG PO TABS
50.0000 mg | ORAL_TABLET | Freq: Two times a day (BID) | ORAL | Status: DC
  Administered 2014-05-22 – 2014-05-24 (×4): 50 mg via ORAL
  Filled 2014-05-22 (×6): qty 1

## 2014-05-22 MED ORDER — ONDANSETRON HCL 4 MG/2ML IJ SOLN
4.0000 mg | Freq: Four times a day (QID) | INTRAMUSCULAR | Status: DC | PRN
  Filled 2014-05-22: qty 2

## 2014-05-22 MED ORDER — SODIUM CHLORIDE 0.9 % IJ SOLN
INTRAMUSCULAR | Status: AC
  Filled 2014-05-22: qty 10

## 2014-05-22 MED ORDER — METHOCARBAMOL 1000 MG/10ML IJ SOLN
500.0000 mg | Freq: Four times a day (QID) | INTRAVENOUS | Status: DC | PRN
  Administered 2014-05-22 (×2): 500 mg via INTRAVENOUS
  Filled 2014-05-22 (×2): qty 5

## 2014-05-22 MED ORDER — PROPOFOL 10 MG/ML IV BOLUS
INTRAVENOUS | Status: AC
  Filled 2014-05-22: qty 20

## 2014-05-22 MED ORDER — SUCCINYLCHOLINE CHLORIDE 20 MG/ML IJ SOLN
INTRAMUSCULAR | Status: DC | PRN
  Administered 2014-05-22: 120 mg via INTRAVENOUS

## 2014-05-22 MED ORDER — FENTANYL CITRATE 0.05 MG/ML IJ SOLN
INTRAMUSCULAR | Status: AC
  Filled 2014-05-22: qty 5

## 2014-05-22 MED ORDER — OXYCODONE-ACETAMINOPHEN 5-325 MG PO TABS
2.0000 | ORAL_TABLET | ORAL | Status: DC | PRN
Start: 2014-05-22 — End: 2014-05-23
  Administered 2014-05-22 – 2014-05-23 (×2): 2 via ORAL
  Filled 2014-05-22 (×2): qty 2

## 2014-05-22 MED ORDER — NEOSTIGMINE METHYLSULFATE 10 MG/10ML IV SOLN
INTRAVENOUS | Status: AC
  Filled 2014-05-22: qty 1

## 2014-05-22 MED ORDER — ACETAMINOPHEN 325 MG PO TABS: 650.0000 mg | ORAL_TABLET | Freq: Four times a day (QID) | ORAL | Status: DC | PRN

## 2014-05-22 MED ORDER — HYDROMORPHONE HCL PF 1 MG/ML IJ SOLN
1.0000 mg | INTRAMUSCULAR | Status: DC | PRN
  Administered 2014-05-22 – 2014-05-23 (×4): 1 mg via INTRAVENOUS
  Filled 2014-05-22 (×4): qty 1

## 2014-05-22 MED ORDER — LACTATED RINGERS IV SOLN
INTRAVENOUS | Status: DC
Start: 2014-05-22 — End: 2014-05-22
  Administered 2014-05-22: 07:00:00 via INTRAVENOUS

## 2014-05-22 MED ORDER — POLYMYXIN B SULFATE 500000 UNITS IJ SOLR
INTRAMUSCULAR | Status: DC | PRN
  Administered 2014-05-22: 09:00:00

## 2014-05-22 MED ORDER — PROPOFOL 10 MG/ML IV BOLUS
INTRAVENOUS | Status: DC | PRN
  Administered 2014-05-22: 250 mg via INTRAVENOUS

## 2014-05-22 MED ORDER — GLYCOPYRROLATE 0.2 MG/ML IJ SOLN
INTRAMUSCULAR | Status: AC
  Filled 2014-05-22: qty 4

## 2014-05-22 MED ORDER — CELECOXIB 200 MG PO CAPS
200.0000 mg | ORAL_CAPSULE | Freq: Two times a day (BID) | ORAL | Status: DC
  Administered 2014-05-22 – 2014-05-24 (×4): 200 mg via ORAL
  Filled 2014-05-22 (×6): qty 1

## 2014-05-22 SURGICAL SUPPLY — 55 items
BAG ZIPLOCK 12X15 (MISCELLANEOUS) ×2 IMPLANT
BLADE SAW SAG 73X25 THK (BLADE) ×1
BLADE SAW SGTL 73X25 THK (BLADE) ×1 IMPLANT
CAPT HIP PF COP ×2 IMPLANT
DERMABOND ADVANCED (GAUZE/BANDAGES/DRESSINGS) ×2
DERMABOND ADVANCED .7 DNX12 (GAUZE/BANDAGES/DRESSINGS) ×2 IMPLANT
DRAPE INCISE IOBAN 85X60 (DRAPES) ×2 IMPLANT
DRAPE ORTHO SPLIT 77X108 STRL (DRAPES) ×2
DRAPE POUCH INSTRU U-SHP 10X18 (DRAPES) ×2 IMPLANT
DRAPE SURG 17X11 SM STRL (DRAPES) ×2 IMPLANT
DRAPE SURG ORHT 6 SPLT 77X108 (DRAPES) ×2 IMPLANT
DRAPE U-SHAPE 47X51 STRL (DRAPES) ×2 IMPLANT
DRSG ADAPTIC 3X8 NADH LF (GAUZE/BANDAGES/DRESSINGS) IMPLANT
DRSG AQUACEL AG ADV 3.5X10 (GAUZE/BANDAGES/DRESSINGS) IMPLANT
DRSG AQUACEL AG ADV 3.5X14 (GAUZE/BANDAGES/DRESSINGS) ×2 IMPLANT
DRSG PAD ABDOMINAL 8X10 ST (GAUZE/BANDAGES/DRESSINGS) IMPLANT
DRSG TEGADERM 4X4.75 (GAUZE/BANDAGES/DRESSINGS) ×2 IMPLANT
DURAPREP 26ML APPLICATOR (WOUND CARE) ×4 IMPLANT
ELECT BLADE TIP CTD 4 INCH (ELECTRODE) ×2 IMPLANT
ELECT REM PT RETURN 9FT ADLT (ELECTROSURGICAL) ×2
ELECTRODE REM PT RTRN 9FT ADLT (ELECTROSURGICAL) ×1 IMPLANT
EVACUATOR 1/8 PVC DRAIN (DRAIN) ×2 IMPLANT
FACESHIELD WRAPAROUND (MASK) ×8 IMPLANT
GAUZE SPONGE 2X2 8PLY STRL LF (GAUZE/BANDAGES/DRESSINGS) ×1 IMPLANT
GAUZE SPONGE 4X4 12PLY STRL (GAUZE/BANDAGES/DRESSINGS) IMPLANT
GLOVE BIOGEL PI IND STRL 8 (GLOVE) ×1 IMPLANT
GLOVE BIOGEL PI INDICATOR 8 (GLOVE) ×1
GLOVE ECLIPSE 8.0 STRL XLNG CF (GLOVE) ×4 IMPLANT
GLOVE SURG SS PI 6.5 STRL IVOR (GLOVE) ×2 IMPLANT
GOWN STRL REUS W/TWL LRG LVL3 (GOWN DISPOSABLE) ×8 IMPLANT
GOWN STRL REUS W/TWL XL LVL3 (GOWN DISPOSABLE) ×2 IMPLANT
IMMOBILIZER KNEE 20 (SOFTGOODS) ×2
IMMOBILIZER KNEE 20 THIGH 36 (SOFTGOODS) ×1 IMPLANT
KIT BASIN OR (CUSTOM PROCEDURE TRAY) ×2 IMPLANT
MANIFOLD NEPTUNE II (INSTRUMENTS) ×2 IMPLANT
NEEDLE HYPO 22GX1.5 SAFETY (NEEDLE) ×2 IMPLANT
PACK TOTAL JOINT (CUSTOM PROCEDURE TRAY) ×2 IMPLANT
POSITIONER SURGICAL ARM (MISCELLANEOUS) ×2 IMPLANT
SPONGE GAUZE 2X2 STER 10/PKG (GAUZE/BANDAGES/DRESSINGS) ×1
SPONGE LAP 18X18 X RAY DECT (DISPOSABLE) IMPLANT
SPONGE LAP 4X18 X RAY DECT (DISPOSABLE) ×2 IMPLANT
SPONGE SURGIFOAM ABS GEL 100 (HEMOSTASIS) ×2 IMPLANT
STAPLER VISISTAT 35W (STAPLE) IMPLANT
SUCTION FRAZIER TIP 10 FR DISP (SUCTIONS) ×2 IMPLANT
SUT MNCRL AB 4-0 PS2 18 (SUTURE) IMPLANT
SUT VIC AB 0 CT1 27 (SUTURE) ×2
SUT VIC AB 0 CT1 27XBRD ANTBC (SUTURE) ×2 IMPLANT
SUT VIC AB 1 CT1 27 (SUTURE) ×4
SUT VIC AB 1 CT1 27XBRD ANTBC (SUTURE) ×4 IMPLANT
SUT VIC AB 2-0 CT1 27 (SUTURE) ×4
SUT VIC AB 2-0 CT1 TAPERPNT 27 (SUTURE) ×4 IMPLANT
SYRINGE 20CC LL (MISCELLANEOUS) ×2 IMPLANT
TOWEL OR 17X26 10 PK STRL BLUE (TOWEL DISPOSABLE) ×4 IMPLANT
TRAY FOLEY CATH 14FRSI W/METER (CATHETERS) ×2 IMPLANT
WATER STERILE IRR 1500ML POUR (IV SOLUTION) ×2 IMPLANT

## 2014-05-22 NOTE — Op Note (Signed)
NAME:  [name] [name], [name] [name] NO.:  [id number]  MEDICAL RECORD NO.:  [id number]  LOCATION:  [facility]                         FACILITY:  [facility]  PHYSICIAN:  Kipp Brood. Roy Tokarz, M.D.DATE OF BIRTH:  10-17-1946  DATE OF PROCEDURE:  05/22/2014 DATE OF DISCHARGE:                              OPERATIVE REPORT   SURGEON:  Kipp Brood. Gladstone Lighter, M.D.  OPERATIVE ASSISTANT:  Pietro Cassis. Alvan Dame, M.D. and [place], [place].  POSTOPERATIVE DIAGNOSIS:  Severe osteoarthritis of the right hip with bone on bone.  POSTOPERATIVE DIAGNOSIS:  Severe osteoarthritis of the right hip with bone on bone.  OPERATION: 1. Right total hip arthroplasty utilizing DePuy system.  The sizes     used was a high offset Tri-Lock stem size 6.  The cup was a     pinnacle cup, 58 mm.  One screw was utilized for cup fixation and a     hole eliminator was used. 2. The insert was a Ultrex polyethylene liner, 36-mm inside diameter.     The head and neck was a +5 ball neck length and a ceramic ball its     size 36 mm diameter.  The patient had clindamycin 900 mg IV.  DESCRIPTION OF PROCEDURE:  Under general anesthesia, routine orthopedic prepping and draping was carried out with the patient on the left side and right side up.  The appropriate time-out was first carried out.  I also marked the appropriate right hip in the holding area.  The posterolateral approach to the hip was carried out on the right. Bleeders were identified and cauterized.  At that particular time, I then went on and inserted self-retaining retractors.  The incision was carried down through the iliotibial band.  I partially detached the external rotators.  I then did a capsulectomy, dislocated the right femoral head and amputated right femoral head with an oscillating saw. After that, we then utilized our box osteotome to remove the portion of the cancellous bone from the greater trochanter.  I utilized my widening reamer and then my canal finer.   I thoroughly irrigated out the canal, then packed the canal opened and with a sponge and the large sponge then was later removed.  Following that, attention was directed to the acetabulum.  We completed the capsulectomy.  We reamed the acetabulum up to 57 mm for a 58 mm cup, 58-mm pinnacle cup was inserted.  We checked the angle with the Charnley guide, had an excellent angle.  We then made 1 drill hole through the hole in the cup and inserted a 30-mm screw for fixation.  We then inserted the hole eliminator.  We irrigated the area out.  Good hemostasis was maintained.  We then directed attention to the femoral component.  We inserted our permanent components after we previously went through trials.  Permanent component was inserted at the appropriate anteversion and we then inserted a size 6 high offset Tri- Lock stem.  We went through trials again with a +1.5, and then a +5 ball.  We selected a +5 ball for stability and leg length purposes.  The leg lengths were measured during the procedure as well.  We then removed  the trial head and then inserted our permanent ceramic ball which is a +5 neck length and a size 36 mm diameter.  The hip was reduced.  We had excellent function and excellent leg lengths.  Good stability as well. We prior to that utilized the osteotome to remove some of the osteophytic spurs from the medial aspect of the acetabulum.  We thoroughly irrigated out the area.  I inserted some thrombin-soaked Gelfoam followed by inserting a Hemovac drain.  We closed the wound over Hemovac drain in the usual fashion.  We injected a mixture of 20 mL of Exparel with 20 mL of saline.  Sterile dressings were applied          ______________________________ Kipp Brood. Gladstone Lighter, M.D.     RAG/MEDQ  D:  05/22/2014  T:  05/22/2014  Job:  [id number]

## 2014-05-22 NOTE — Anesthesia Postprocedure Evaluation (Signed)
  Anesthesia Post-op Note  Patient: [name] [name]  Procedure(s) Performed: Procedure(s) (LRB): RIGHT TOTAL HIP ARTHROPLASTY (Right)  Patient Location: PACU  Anesthesia Type: General  Level of Consciousness: awake and alert   Airway and Oxygen Therapy: Patient Spontanous Breathing  Post-op Pain: mild  Post-op Assessment: Post-op Vital signs reviewed, Patient's Cardiovascular Status Stable, Respiratory Function Stable, Patent Airway and No signs of Nausea or vomiting  Last Vitals:  Filed Vitals:   05/22/14 1015  BP: 126/63  Pulse: 69  Temp:   Resp: 11    Post-op Vital Signs: stable   Complications: No apparent anesthesia complications

## 2014-05-22 NOTE — Progress Notes (Signed)
X-ray results noted 

## 2014-05-22 NOTE — Op Note (Signed)
NAME:  COMPZackory, Pudlo NO.:  [id number]  MEDICAL RECORD NO.:  [id number]  LOCATION:  [facility]                         FACILITY:  [facility]  PHYSICIAN:  Kipp Brood. Seraphina Mitchner, M.D.DATE OF BIRTH:  June 06, 1946  DATE OF PROCEDURE:  05/22/2014 DATE OF DISCHARGE:                              OPERATIVE REPORT   SURGEON:  Kipp Brood. Gladstone Lighter, MD  ASSISTANT:  Pietro Cassis. Alvan Dame, MD; and [place], [place]  PREOPERATIVE DIAGNOSIS:  Severe osteoarthritis of the right hip.  POSTOPERATIVE DIAGNOSIS:  Severe osteoarthritis of the right hip.  OPERATION:  Right total hip arthroplasty utilizing DePuy system.  We utilized a size 6 high offset Tri-Lock stem, the ball with a +5 ceramic 36 mm diameter, the Pinnacle cup of a size 58 with 1 screw and the hole eliminator.  The insert was a ALTRX insert inside diameter 36 mm.  DESCRIPTION OF PROCEDURE:  Under general anesthesia, routine orthopedic prep and draping was carried out of the right hip with the patient on left side right hip up.  Appropriate time-out was first carried out.  I also marked the appropriate right leg in the holding area.  At this time, the patient had clindamycin 900 mg.  He is allergic to PENICILLIN. Following that, a posterior approach was carried out to the hip in the usual fashion.  Bleeders were identified and cauterized.  The incision was carried down to the hip capsule and self-retaining retractors were advanced and after I incised the iliotibial band, I partially detached the external rotators and did a capsulectomy.  The hip was dislocated. We amputated the femoral neck at the appropriate neck length.  At this particular time, attention was directed to the head.   Dictation ended at this point.          ______________________________ Kipp Brood. Gladstone Lighter, M.D.     RAG/MEDQ  D:  05/22/2014  T:  05/22/2014  Job:  [id number]

## 2014-05-22 NOTE — Evaluation (Signed)
Physical Therapy Evaluation Patient Details Name: [name] [name] MRN: [id number] DOB: 09/30/1946 Today's Date: 05/22/2014   History of Present Illness  R THR - post approach - PWB  Clinical Impression  Pt s/p R THR presents with decreased R LE strength/ROM, post op pain, posterior THP and PWB status limiting functional mobility.  Pt should progress to d/c home with family assist and HHPT follow up.    Follow Up Recommendations Home health PT    Equipment Recommendations  None recommended by PT    Recommendations for Other Services OT consult     Precautions / Restrictions Precautions Precautions: Posterior Hip Precaution Booklet Issued: Yes (comment) Precaution Comments: sign hung in room Restrictions Weight Bearing Restrictions: Yes RLE Weight Bearing: Partial weight bearing RLE Partial Weight Bearing Percentage or Pounds: 50%      Mobility  Bed Mobility Overal bed mobility: Needs Assistance Bed Mobility: Supine to Sit     Supine to sit: Min assist;Mod assist;+2 for safety/equipment     General bed mobility comments: cues for sequence and use of L LE to self assist  Transfers Overall transfer level: Needs assistance Equipment used: Rolling walker (2 wheeled) Transfers: Sit to/from Stand Sit to Stand: Min assist;Mod assist;+2 safety/equipment         General transfer comment: cues for LE management, adherence to THP and use of UEs to self assist  Ambulation/Gait Ambulation/Gait assistance: Min assist;Mod assist;+2 safety/equipment Ambulation Distance (Feet): 12 Feet Assistive device: Rolling walker (2 wheeled) Gait Pattern/deviations: Step-to pattern;Decreased step length - right;Decreased step length - left;Shuffle;Trunk flexed Gait velocity: decr   General Gait Details: cues for sequence, posture, position from RW and PWB on R  Stairs            Wheelchair Mobility    Modified Rankin (Stroke Patients Only)       Balance                                              Pertinent Vitals/Pain 4/10; premed, ice pack provided    Home Living Family/patient expects to be discharged to:: Private residence Living Arrangements: Spouse/significant other Available Help at Discharge: Family Type of Home: House Home Access: Stairs to enter Entrance Stairs-Rails: None Entrance Stairs-Number of Steps: 1+1 Home Layout: One level Home Equipment: Environmental consultant - 2 wheels;Bedside commode      Prior Function Level of Independence: Independent;Independent with assistive device(s)         Comments: Used cane - wife states he was unsteady at times     Hand Dominance   Dominant Hand: Right    Extremity/Trunk Assessment   Upper Extremity Assessment: Overall WFL for tasks assessed           Lower Extremity Assessment: RLE deficits/detail RLE Deficits / Details: 2/5 hip strength with AAROM at hip to 80 flex and 15 abd    Cervical / Trunk Assessment: Normal  Communication   Communication: No difficulties  Cognition Arousal/Alertness: Awake/alert Behavior During Therapy: WFL for tasks assessed/performed Overall Cognitive Status: Within Functional Limits for tasks assessed                      General Comments      Exercises Total Joint Exercises Ankle Circles/Pumps: AROM;Both;15 reps;Supine Quad Sets: AROM;Both;10 reps;Supine Heel Slides: AAROM;Right;15 reps;Supine Hip ABduction/ADduction: AAROM;Right;10 reps;Supine      Assessment/Plan  PT Assessment Patient needs continued PT services  PT Diagnosis Difficulty walking   PT Problem List Decreased strength;Decreased range of motion;Decreased activity tolerance;Decreased mobility;Decreased knowledge of use of DME;Obesity;Pain;Decreased knowledge of precautions  PT Treatment Interventions DME instruction;Gait training;Stair training;Functional mobility training;Therapeutic activities;Therapeutic exercise;Patient/family education   PT Goals (Current  goals can be found in the Care Plan section) Acute Rehab PT Goals Patient Stated Goal: Walk without pain PT Goal Formulation: With patient Time For Goal Achievement: 05/29/14 Potential to Achieve Goals: Good    Frequency 7X/week   Barriers to discharge        Co-evaluation               End of Session Equipment Utilized During Treatment: Gait belt Activity Tolerance: Patient tolerated treatment well Patient left: in chair;with call bell/phone within reach;with family/visitor present Nurse Communication: Mobility status         Time: 1450-1535 PT Time Calculation (min): 45 min   Charges:   PT Evaluation $Initial PT Evaluation Tier I: 1 Procedure PT Treatments $Gait Training: 8-22 mins $Therapeutic Exercise: 8-22 mins $Therapeutic Activity: 8-22 mins   PT G Codes:          Tito Ausmus 05/22/2014, 4:56 PM

## 2014-05-22 NOTE — Interval H&P Note (Signed)
History and Physical Interval Note:  05/22/2014 7:05 AM  [name] [name]  has presented today for surgery, with the diagnosis of OA OF RIGHT HIP  The various methods of treatment have been discussed with the patient and family. After consideration of risks, benefits and other options for treatment, the patient has consented to  Procedure(s): RIGHT TOTAL HIP ARTHROPLASTY (Right) as a surgical intervention .  The patient's history has been reviewed, patient examined, no change in status, stable for surgery.  I have reviewed the patient's chart and labs.  Questions were answered to the patient's satisfaction.     Trong Gosling A

## 2014-05-22 NOTE — Transfer of Care (Signed)
Immediate Anesthesia Transfer of Care Note  Patient: [name] [name]  Procedure(s) Performed: Procedure(s): RIGHT TOTAL HIP ARTHROPLASTY (Right)  Patient Location: PACU  Anesthesia Type:General  Level of Consciousness: awake, alert , oriented and patient cooperative  Airway & Oxygen Therapy: Patient Spontanous Breathing and Patient connected to face mask oxygen  Post-op Assessment: Report given to PACU RN, Post -op Vital signs reviewed and stable and Patient moving all extremities  Post vital signs: Reviewed and stable  Complications: No apparent anesthesia complications

## 2014-05-22 NOTE — Progress Notes (Signed)
Portable AP Right Hip X-ray done.

## 2014-05-22 NOTE — Brief Op Note (Signed)
05/22/2014  9:06 AM  PATIENT:  [name] [name]  68 y.o. male  PRE-OPERATIVE DIAGNOSIS:  OA OF RIGHT HIP  POST-OPERATIVE DIAGNOSIS:  right hiposteoarthritis  PROCEDURE:  Procedure(s): RIGHT TOTAL HIP ARTHROPLASTY (Right)  SURGEON:  Surgeon(s) and Role:    * Tobi Bastos, MD - Primary    * Mauri Pole, MD - Assisting  PHYSICIAN ASSISTANT: Ardeen Jourdain PA  ASSISTANTS: Paralee Cancel MD and Ardeen Jourdain PA}   ANESTHESIA:   general  EBL:  Total I/O In: 1000 [I.V.:1000] Out: 183 [Urine:185; Blood:500]  BLOOD ADMINISTERED:none  DRAINS: (One) Hemovact drain(s) in the Right Hip with  Suction Open   LOCAL MEDICATIONS USED:  BUPIVICAINE 20cc mixed with 20cc of Exparel mixed with 20cc of Normal Saline  SPECIMEN:  No Specimen  DISPOSITION OF SPECIMEN:  N/A  COUNTS:  YES  TOURNIQUET:  * No tourniquets in log *  DICTATION: .Other Dictation: Dictation Number [id number]  PLAN OF CARE: Admit to inpatient   PATIENT DISPOSITION:  Stable in OR   Delay start of Pharmacological VTE agent (>24hrs) due to surgical blood loss or risk of bleeding: yes

## 2014-05-23 DIAGNOSIS — D62 Acute posthemorrhagic anemia: Secondary | ICD-10-CM | POA: Diagnosis not present

## 2014-05-23 LAB — BASIC METABOLIC PANEL
Anion gap: 8 (ref 5–15)
BUN: 16 mg/dL (ref 6–23)
CO2: 28 mEq/L (ref 19–32)
Calcium: 8.7 mg/dL (ref 8.4–10.5)
Chloride: 103 mEq/L (ref 96–112)
Creatinine, Ser: 0.92 mg/dL (ref 0.50–1.35)
GFR calc Af Amer: >90 mL/min (ref >90)
GFR calc non Af Amer: 85 mL/min — ABNORMAL LOW (ref >90)
Glucose, Bld: 125 mg/dL — ABNORMAL HIGH (ref 70–99)
Potassium: 4.7 mEq/L (ref 3.7–5.3)
Sodium: 139 mEq/L (ref 137–147)

## 2014-05-23 LAB — CBC
HCT: 28.7 % — ABNORMAL LOW (ref 39.0–52.0)
Hemoglobin: 9.6 g/dL — ABNORMAL LOW (ref 13.0–17.0)
MCH: 31 pg (ref 26.0–34.0)
MCHC: 33.4 g/dL (ref 30.0–36.0)
MCV: 92.6 fL (ref 78.0–100.0)
Platelets: 134 10*3/uL — ABNORMAL LOW (ref 150–400)
RBC: 3.1 MIL/uL — ABNORMAL LOW (ref 4.22–5.81)
RDW: 14.5 % (ref 11.5–15.5)
WBC: 10.7 10*3/uL — ABNORMAL HIGH (ref 4.0–10.5)

## 2014-05-23 MED ORDER — DSS 100 MG PO CAPS: 100.0000 mg | ORAL_CAPSULE | Freq: Two times a day (BID) | ORAL | Status: DC

## 2014-05-23 MED ORDER — OXYCODONE-ACETAMINOPHEN 5-325 MG PO TABS
2.0000 | ORAL_TABLET | ORAL | Status: DC | PRN
  Administered 2014-05-23 – 2014-05-24 (×5): 2 via ORAL
  Filled 2014-05-23 (×5): qty 2

## 2014-05-23 MED ORDER — FERROUS SULFATE 325 (65 FE) MG PO TABS: 325.0000 mg | ORAL_TABLET | Freq: Three times a day (TID) | ORAL | Status: DC

## 2014-05-23 MED ORDER — RIVAROXABAN 10 MG PO TABS: 10.0000 mg | ORAL_TABLET | Freq: Every day | ORAL | Status: DC

## 2014-05-23 MED ORDER — OXYCODONE-ACETAMINOPHEN 5-325 MG PO TABS: 2.0000 | ORAL_TABLET | ORAL | Status: DC | PRN

## 2014-05-23 MED ORDER — METHOCARBAMOL 500 MG PO TABS: 500.0000 mg | ORAL_TABLET | Freq: Four times a day (QID) | ORAL | Status: DC | PRN

## 2014-05-23 MED ORDER — DOCUSATE SODIUM 100 MG PO CAPS
100.0000 mg | ORAL_CAPSULE | Freq: Two times a day (BID) | ORAL | Status: DC
  Administered 2014-05-23 – 2014-05-24 (×3): 100 mg via ORAL

## 2014-05-23 NOTE — Progress Notes (Signed)
Physical Therapy Treatment Patient Details Name: [name] [name] MRN: [id number] DOB: [birth year] Today's Date: 05/23/2014    History of Present Illness R THR - post approach - PWB    PT Comments    Progressing   Follow Up Recommendations  Home health PT     Equipment Recommendations  None recommended by PT    Recommendations for Other Services OT consult     Precautions / Restrictions Precautions Precautions: Posterior Hip Precaution Booklet Issued: Yes (comment) Precaution Comments: sign hung in room; Pt recalls 2/3 THP without cues Restrictions Weight Bearing Restrictions: Yes RLE Weight Bearing: Partial weight bearing RLE Partial Weight Bearing Percentage or Pounds: 50%    Mobility  Bed Mobility Overal bed mobility: Needs Assistance Bed Mobility: Sit to Supine       Sit to supine: Min assist;Mod assist   General bed mobility comments: cues for sequence and use of L LE to self assist  Transfers Overall transfer level: Needs assistance Equipment used: Rolling walker (2 wheeled) Transfers: Sit to/from Stand Sit to Stand: Min assist;Mod assist;+2 physical assistance         General transfer comment: cues for LE management, adherence to THP and use of UEs to self assist  Ambulation/Gait Ambulation/Gait assistance: Min assist Ambulation Distance (Feet): 76 Feet Assistive device: Rolling walker (2 wheeled) Gait Pattern/deviations: Step-to pattern;Decreased step length - right;Decreased step length - left;Shuffle;Trunk flexed Gait velocity: decr   General Gait Details: cues for sequence, posture, position from RW and PWB on R.  Several short standing rests required to complete task   Stairs            Wheelchair Mobility    Modified Rankin (Stroke Patients Only)       Balance                                    Cognition Arousal/Alertness: Awake/alert Behavior During Therapy: WFL for tasks assessed/performed Overall Cognitive  Status: Within Functional Limits for tasks assessed                      Exercises Total Joint Exercises Ankle Circles/Pumps: AROM;Both;15 reps;Supine Quad Sets: AROM;Both;10 reps;Supine Gluteal Sets: AROM;Both;10 reps;Supine Heel Slides: AAROM;Right;15 reps;Supine Hip ABduction/ADduction: AAROM;Right;10 reps;Supine    General Comments        Pertinent Vitals/Pain 3/10; premed, ice pack provided    Home Living                      Prior Function            PT Goals (current goals can now be found in the care plan section) Acute Rehab PT Goals Patient Stated Goal: Walk without pain PT Goal Formulation: With patient Time For Goal Achievement: 05/29/14 Potential to Achieve Goals: Good Progress towards PT goals: Progressing toward goals    Frequency  7X/week    PT Plan Current plan remains appropriate    Co-evaluation             End of Session Equipment Utilized During Treatment: Gait belt Activity Tolerance: Patient tolerated treatment well Patient left: in bed;with call bell/phone within reach;with family/visitor present     Time: 4008-6761 PT Time Calculation (min): 40 min  Charges:  $Gait Training: 8-22 mins $Therapeutic Exercise: 8-22 mins $Therapeutic Activity: 8-22 mins  G Codes:      [name] [name] 06/03/14, 5:55 PM

## 2014-05-23 NOTE — Progress Notes (Signed)
CSW consulted for SNF placement. PN reviewed. Pt plans to return home following hospital d/c. RNCN will assist with d/c planning needs.  Werner Lean LCSW [phone]

## 2014-05-23 NOTE — Progress Notes (Signed)
   Subjective: 1 Day Post-Op Procedure(s) (LRB): RIGHT TOTAL HIP ARTHROPLASTY (Right) Patient reports pain as mild.   Patient seen in rounds with Dr. Gladstone Lighter. Patient is well, and has had no acute complaints or problems. He reports that he did get some rest last night. No issues overnight. No SOB or chest pain. Reports that he walked with therapy yesterday.  Plan is to go Home after hospital stay.  Objective: Vital signs in last 24 hours: Temp:  [97.8 F (36.6 C)-98.8 F (37.1 C)] 97.9 F (36.6 C) (07/17 0626) Pulse Rate:  [63-94] 88 (07/17 0626) Resp:  [11-18] 14 (07/17 0626) BP: (90-145)/(41-73) 145/49 mmHg (07/17 0626) SpO2:  [95 %-100 %] 99 % (07/17 0626)  Intake/Output from previous day:  Intake/Output Summary (Last 24 hours) at 05/23/14 0724 Last data filed at 05/23/14 0658  Gross per 24 hour  Intake   4620 ml  Output   3590 ml  Net   1030 ml    Intake/Output this shift:    Labs:  Recent Labs  05/23/14 0533  HGB 9.6*    Recent Labs  05/23/14 0533  WBC 10.7*  RBC 3.10*  HCT 28.7*  PLT 134*    Recent Labs  05/23/14 0533  NA 139  K 4.7  CL 103  CO2 28  BUN 16  CREATININE 0.92  GLUCOSE 125*  CALCIUM 8.7    EXAM General - Patient is Alert and Oriented Extremity - Neurologically intact Intact pulses distally Dorsiflexion/Plantar flexion intact Compartment soft Dressing - moderate drainage from hemovac site. Dressing placed Motor Function - intact, moving foot and toes well on exam.  Hemovac pulled without difficulty.  Past Medical History  Diagnosis Date  . Hypertension   . Arthritis     BIL KNEES  . Eczema   . History of kidney stones   . Adrenal gland cyst   . Herpesviral conjunctivitis     hx of in past causing need for corneal transplant rt eye    Assessment/Plan: 1 Day Post-Op Procedure(s) (LRB): RIGHT TOTAL HIP ARTHROPLASTY (Right) Active Problems:   Osteoarthritis of right hip   History of total right hip arthroplasty  Postoperative acute blood loss anemia  Estimated body mass index is 34.31 kg/(m^2) as calculated from the following:   Height as of this encounter: 6' (1.829 m).   Weight as of this encounter: 114.76 kg (253 lb). Advance diet Up with therapy D/C IV fluids when tolerating POs well Discharge home with home health tentative tomorrow  DVT Prophylaxis - Xarelto Partial weightbearing right LE D/C Knee Immobilizer Hemovac Pulled Hip Preacutions  He is doing well. Will have nursing staff closely monitor hemovac site as he has moderate amount of drainage continuing at that site. Will recheck labs in the morning. If continues to progress with therapy, DC tomorrow with home health.  Ardeen Jourdain, PA-C Orthopaedic Surgery 05/23/2014, 7:24 AM

## 2014-05-23 NOTE — Discharge Instructions (Addendum)
Walk with your walker. Partial weightbearing right LE for one week. Then progress to weightbearing as tolerated.  [place] will follow you at home for your therapy Change your dressing over the drain site daily or as needed. Do not change the dressing over the incision.  Shower only, no tub bath. Call if any temperatures greater than 101 or any wound complications: [phone] during the day and ask for Dr. Charlestine Night nurse, Brunilda Payor. ========================================================================================================  Information on my medicine - XARELTO (Rivaroxaban)  This medication education was reviewed with me or my healthcare representative as part of my discharge preparation.  The pharmacist that spoke with me during my hospital stay was:  Zaveon Gillen, Julieta Bellini, RPH  Why was Xarelto prescribed for you? Xarelto was prescribed for you to reduce the risk of blood clots forming after orthopedic surgery. The medical term for these abnormal blood clots is venous thromboembolism (VTE).  What do you need to know about xarelto ? Take your Xarelto ONCE DAILY at the same time every day. You may take it either with or without food.  If you have difficulty swallowing the tablet whole, you may crush it and mix in applesauce just prior to taking your dose.  Take Xarelto exactly as prescribed by your doctor and DO NOT stop taking Xarelto without talking to the doctor who prescribed the medication.  Stopping without other VTE prevention medication to take the place of Xarelto may increase your risk of developing a clot.  After discharge, you should have regular check-up appointments with your healthcare provider that is prescribing your Xarelto.    What do you do if you miss a dose? If you miss a dose, take it as soon as you remember on the same day then continue your regularly scheduled once daily regimen the next day. Do not take two doses of Xarelto on the  same day.   Important Safety Information A possible side effect of Xarelto is bleeding. You should call your healthcare provider right away if you experience any of the following:   Bleeding from an injury or your nose that does not stop.   Unusual colored urine (red or dark brown) or unusual colored stools (red or black).   Unusual bruising for unknown reasons.   A serious fall or if you hit your head (even if there is no bleeding).  Some medicines may interact with Xarelto and might increase your risk of bleeding while on Xarelto. To help avoid this, consult your healthcare provider or pharmacist prior to using any new prescription or non-prescription medications, including herbals, vitamins, non-steroidal anti-inflammatory drugs (NSAIDs) and supplements.  This website has more information on Xarelto: [email or web address]

## 2014-05-23 NOTE — Evaluation (Signed)
Occupational Therapy Evaluation Patient Details Name: [name] [name] MRN: [id number] DOB: 1946/04/03 Today's Date: 05/23/2014    History of Present Illness R THR - post approach - PWB   Clinical Impression   This 68 year old man was admitted for the above surgery.  He will benefit from skilled OT to increase safety and independence with adls, following THPs (posterior) and PWB.  Wife is an Therapist, sports and she has many questions.  Will follow in acute and recommend HHOT    Follow Up Recommendations  Home health OT    Equipment Recommendations  None recommended by OT (wife obtained all bathroom DME and AE)    Recommendations for Other Services       Precautions / Restrictions Precautions Precautions: Posterior Hip Precaution Booklet Issued: Yes (comment) Precaution Comments: sign hung in room; Pt recalls 1/3 THP without cues Restrictions Weight Bearing Restrictions: Yes RLE Weight Bearing: Partial weight bearing RLE Partial Weight Bearing Percentage or Pounds: 50%      Mobility Bed Mobility        Supine to sit: Min assist (cues for technique/THPs and assist for RLE)        Transfers Overall transfer level: Needs assistance Equipment used: Rolling walker (2 wheeled) Transfers: Sit to/from Stand Sit to Stand: Mod assist;From elevated surface (bed)         General transfer comment: cues for LE management, adherence to THP and use of UEs to self assist    Balance                                            ADL Overall ADL's : Needs assistance/impaired     Grooming: Set up;Sitting   Upper Body Bathing: Set up;Sitting   Lower Body Bathing: Minimal assistance;With adaptive equipment;Sit to/from stand   Upper Body Dressing : Set up;Sitting   Lower Body Dressing: Moderate assistance;With adaptive equipment;Sit to/from stand   Toilet Transfer: Moderate assistance;Stand-pivot (bed to chair)   Toileting- Clothing Manipulation and Hygiene: Moderate  assistance;Sit to/from stand         General ADL Comments: reviewed AE and pt practiced from seated level: cues for THPs   Needs mod A for sit to stand.  Wife is an Therapist, sports and has numerous questions.       Vision                     Perception     Praxis      Pertinent Vitals/Pain No c/o pain during OT:  Premedicated and repositioned     Hand Dominance Right   Extremity/Trunk Assessment Upper Extremity Assessment Upper Extremity Assessment: Overall WFL for tasks assessed           Communication Communication Communication: No difficulties   Cognition Arousal/Alertness: Awake/alert Behavior During Therapy: WFL for tasks assessed/performed Overall Cognitive Status: Within Functional Limits for tasks assessed                     General Comments       Exercises       Shoulder Instructions      Home Living Family/patient expects to be discharged to:: Private residence Living Arrangements: Spouse/significant other Available Help at Discharge: Family Type of Home: House Home Access: Stairs to enter Technical brewer of Steps: 1+1 Entrance Stairs-Rails: None Home Layout: One level  Bathroom Shower/Tub: Occupational psychologist: Standard     Home Equipment: Environmental consultant - 2 wheels;Toilet riser;Shower seat   Additional Comments: AE kit including dressing stick.  Wife went to medical supply and bought everything.  Got high toilet rise with rails--she will measure this      Prior Functioning/Environment Level of Independence: Independent;Independent with assistive device(s)        Comments: Used cane - wife states he was unsteady at times    OT Diagnosis: Generalized weakness   OT Problem List: Decreased strength;Decreased activity tolerance;Decreased knowledge of use of DME or AE;Decreased knowledge of precautions;Pain   OT Treatment/Interventions: Self-care/ADL training;DME and/or AE instruction;Patient/family education     OT Goals(Current goals can be found in the care plan section) Acute Rehab OT Goals Patient Stated Goal: Walk without pain OT Goal Formulation: With patient Time For Goal Achievement: 05/30/14 Potential to Achieve Goals: Good ADL Goals Pt Will Perform Grooming: with min guard assist;standing Pt Will Perform Lower Body Bathing: with min assist;with adaptive equipment;sit to/from stand Pt Will Perform Lower Body Dressing: with min assist;with adaptive equipment;sit to/from stand Pt Will Transfer to Toilet: with min assist;ambulating;bedside commode Pt Will Perform Toileting - Clothing Manipulation and hygiene: with min assist;sit to/from stand Pt Will Perform Tub/Shower Transfer: Shower transfer;shower seat;ambulating;with min assist Additional ADL Goal #1: Pt will recall 3/3 THPS  OT Frequency: Min 2X/week   Barriers to D/C:            Co-evaluation              End of Session    Activity Tolerance: Patient tolerated treatment well Patient left: in chair;with call bell/phone within reach;with family/visitor present;with nursing/sitter in room   Time: 0857-0926 OT Time Calculation (min): 29 min Charges:  OT General Charges $OT Visit: 1 Procedure OT Evaluation $Initial OT Evaluation Tier I: 1 Procedure OT Treatments $Self Care/Home Management : 23-37 mins G-Codes:    [name] [name] 05-29-2014, 11:24 AM Lesle Chris, OTR/L [phone] 05-29-2014

## 2014-05-23 NOTE — Progress Notes (Signed)
Physical Therapy Treatment Patient Details Name: Miroslav Gin MRN: [id number] DOB: 29-Jul-1946 Today's Date: 06-15-14    History of Present Illness R THR - post approach - PWB    PT Comments    Progressing   Follow Up Recommendations  Home health PT     Equipment Recommendations  None recommended by PT    Recommendations for Other Services OT consult     Precautions / Restrictions Precautions Precautions: Posterior Hip Precaution Booklet Issued: Yes (comment) Precaution Comments: sign hung in room; Pt recalls 2/3 THP without cues Restrictions Weight Bearing Restrictions: Yes RLE Weight Bearing: Partial weight bearing RLE Partial Weight Bearing Percentage or Pounds: 50%    Mobility  Bed Mobility Overal bed mobility:  (NT - OOB with OT)                Transfers Overall transfer level: Needs assistance Equipment used: Rolling walker (2 wheeled) Transfers: Sit to/from Stand Sit to Stand: Min assist;Mod assist         General transfer comment: cues for LE management, adherence to THP and use of UEs to self assist  Ambulation/Gait Ambulation/Gait assistance: Min assist Ambulation Distance (Feet): 59 Feet Assistive device: Rolling walker (2 wheeled) Gait Pattern/deviations: Step-to pattern;Decreased step length - right;Decreased step length - left;Shuffle;Trunk flexed     General Gait Details: cues for sequence, posture, position from RW and PWB on R.  Several short standing rests required to complete task   Stairs            Wheelchair Mobility    Modified Rankin (Stroke Patients Only)       Balance                                    Cognition Arousal/Alertness: Awake/alert Behavior During Therapy: WFL for tasks assessed/performed Overall Cognitive Status: Within Functional Limits for tasks assessed                      Exercises      General Comments        Pertinent Vitals/Pain 4/10; premed, ice pack  provided    Home Living                      Prior Function            PT Goals (current goals can now be found in the care plan section) Acute Rehab PT Goals Patient Stated Goal: Walk without pain PT Goal Formulation: With patient Time For Goal Achievement: 05/29/14 Potential to Achieve Goals: Good Progress towards PT goals: Progressing toward goals    Frequency  7X/week    PT Plan Current plan remains appropriate    Co-evaluation             End of Session Equipment Utilized During Treatment: Gait belt Activity Tolerance: Patient tolerated treatment well Patient left: in chair;with call bell/phone within reach;with family/visitor present     Time: 9937-1696 PT Time Calculation (min): 23 min  Charges:  $Gait Training: 23-37 mins                    G Codes:      Keland Peyton 06-15-2014, 11:01 AM

## 2014-05-24 LAB — CBC
HCT: 28.1 % — ABNORMAL LOW (ref 39.0–52.0)
Hemoglobin: 9.4 g/dL — ABNORMAL LOW (ref 13.0–17.0)
MCH: 30.6 pg (ref 26.0–34.0)
MCHC: 33.5 g/dL (ref 30.0–36.0)
MCV: 91.5 fL (ref 78.0–100.0)
Platelets: 143 10*3/uL — ABNORMAL LOW (ref 150–400)
RBC: 3.07 MIL/uL — ABNORMAL LOW (ref 4.22–5.81)
RDW: 14.5 % (ref 11.5–15.5)
WBC: 11.7 10*3/uL — ABNORMAL HIGH (ref 4.0–10.5)

## 2014-05-24 LAB — BASIC METABOLIC PANEL
Anion gap: 9 (ref 5–15)
BUN: 19 mg/dL (ref 6–23)
CO2: 26 mEq/L (ref 19–32)
Calcium: 9.2 mg/dL (ref 8.4–10.5)
Chloride: 103 mEq/L (ref 96–112)
Creatinine, Ser: 0.88 mg/dL (ref 0.50–1.35)
GFR calc Af Amer: >90 mL/min (ref >90)
GFR calc non Af Amer: 86 mL/min — ABNORMAL LOW (ref >90)
Glucose, Bld: 129 mg/dL — ABNORMAL HIGH (ref 70–99)
Potassium: 4.6 mEq/L (ref 3.7–5.3)
Sodium: 138 mEq/L (ref 137–147)

## 2014-05-24 NOTE — Progress Notes (Signed)
Physical Therapy Treatment Patient Details Name: [name] [name] MRN: [id number] DOB: 08/08/1946 Today's Date: 05/24/2014    History of Present Illness R THR - post approach - PWB    PT Comments    Pt progressing well.   Reviewed stairs and car transfers with pt and spouse  Follow Up Recommendations  Home health PT     Equipment Recommendations  None recommended by PT    Recommendations for Other Services OT consult     Precautions / Restrictions Precautions Precautions: Posterior Hip Precaution Booklet Issued: Yes (comment) Precaution Comments: sign hung in room; Pt recalls 2/3 THP without cues Restrictions RLE Weight Bearing: Partial weight bearing RLE Partial Weight Bearing Percentage or Pounds: 50%    Mobility  Bed Mobility Overal bed mobility: Needs Assistance Bed Mobility: Supine to Sit     Supine to sit: Supervision Sit to supine: Min assist   General bed mobility comments: Cues for use of leg lift and sequencing.  No physical assist required   Transfers Overall transfer level: Needs assistance Equipment used: Rolling walker (2 wheeled) Transfers: Sit to/from Stand Sit to Stand: Supervision         General transfer comment: cues for UEs  Ambulation/Gait Ambulation/Gait assistance: Min guard Ambulation Distance (Feet): 40 Feet Assistive device: Rolling walker (2 wheeled) Gait Pattern/deviations: Step-to pattern;Shuffle;Decreased step length - right;Decreased step length - left;Trunk flexed Gait velocity: decr   General Gait Details: cues for posture, position from RW, PWB on R and sequence   Stairs Stairs: Yes Stairs assistance: Min assist Stair Management: No rails;Step to pattern;Backwards;With walker Number of Stairs: 1 (three times) General stair comments: cues for sequence and foot/RW placement.  Spouse present and states they will have second person assist at home for stairs  Wheelchair Mobility    Modified Rankin (Stroke Patients  Only)       Balance                                    Cognition Arousal/Alertness: Awake/alert Behavior During Therapy: WFL for tasks assessed/performed Overall Cognitive Status: Within Functional Limits for tasks assessed                      Exercises      General Comments        Pertinent Vitals/Pain 3/10; premed    Home Living                      Prior Function            PT Goals (current goals can now be found in the care plan section) Acute Rehab PT Goals Patient Stated Goal: Walk without pain PT Goal Formulation: With patient Time For Goal Achievement: 05/29/14 Potential to Achieve Goals: Good Progress towards PT goals: Progressing toward goals    Frequency  7X/week    PT Plan Current plan remains appropriate    Co-evaluation             End of Session Equipment Utilized During Treatment: Gait belt Activity Tolerance: Patient tolerated treatment well Patient left: in chair;with call bell/phone within reach;with family/visitor present     Time: 6384-6659 PT Time Calculation (min): 25 min  Charges:  $Gait Training: 8-22 mins $Therapeutic Activity: 8-22 mins                    G Codes:  [name] [name] 05/24/2014, 5:00 PM

## 2014-05-24 NOTE — Progress Notes (Signed)
Discharged from floor via w/c, wife with pt. No changes in assessment. [name] [name]  

## 2014-05-24 NOTE — Progress Notes (Signed)
CARE MANAGEMENT NOTE 05/24/2014  Patient:  [name] [name],[name] [name]   Account Number:  [id number]  Date Initiated:  05/22/2014  Documentation initiated by:  DAVIS,RHONDA  Subjective/Objective Assessment:   rt total hip     Action/Plan:   home with hhc   Anticipated DC Date:  05/25/2014   Anticipated DC Plan:  [place] referral  NA      DC Planning Services  CM consult      PAC Choice  NA   Choice offered to / List presented to:  C-1 Patient   DME arranged  NA      DME agency  NA     [place] arranged  [facility]   Status of service:  Completed, signed off Medicare Important Message given?  NA - LOS <3 / Initial given by admissions (If response is "NO", the following Medicare IM given date fields will be blank) Date Medicare IM given:   Medicare IM given by:   Date Additional Medicare IM given:   Additional Medicare IM given by:    Discharge Disposition:  [place]  Per UR Regulation:  Reviewed for med. necessity/level of care/duration of stay  If discussed at [place] of Stay Meetings, dates discussed:    Comments:  [id number] Rosana Hoes, RN, BSN, CCM: CHART REVIEWED AND UPDATED. per Jackelyn Poling with [facility] patient has all dme needed at home  Next chart review due on [id number]. NO DISCHARGE NEEDS PRESENT AT THIS TIME WILL CONTINUE TO FOLLOW. CASE MANAGEMENT [phone]

## 2014-05-24 NOTE — Progress Notes (Signed)
Occupational Therapy Treatment Patient Details Name: Ringo Sherod MRN: [id number] DOB: 1946/01/20 Today's Date: 05/24/2014    History of present illness R THR - post approach - PWB   OT comments  Educated pt on leg lifter:  He has this at home.  Ambulated to bathroom again also  Follow Up Recommendations  Home health OT    Equipment Recommendations  None recommended by OT    Recommendations for Other Services      Precautions / Restrictions Precautions Precautions: Posterior Hip Precaution Booklet Issued: Yes (comment) Restrictions RLE Weight Bearing: Partial weight bearing RLE Partial Weight Bearing Percentage or Pounds: 50%       Mobility Bed Mobility           Sit to supine: Min assist   General bed mobility comments: Bed moved to 30" to simulate home and pt used leg lifter, which wife bought him.   Pt did not need stool.   Min A given to RLE as pt was struggling a little.  Pt automatically reached for bedrail and cued him not to use this.  Cues for technique.  Transfers       Sit to Stand: Min guard         General transfer comment: cues for UEs    Balance                                   ADL Overall ADL's : Needs assistance/impaired                         Toilet Transfer: Min guard;RW (stood with walker in front of toilet to urinate)   Toileting- Water quality scientist and Hygiene: Min guard;Sit to/from stand              Vision                     Perception     Praxis      Cognition   Behavior During Therapy: [facility] for tasks assessed/performed Overall Cognitive Status: Within Functional Limits for tasks assessed                       Extremity/Trunk Assessment               Exercises     Shoulder Instructions       General Comments      Pertinent Vitals/ Pain       "My hip is OK"  Repositioned   Home Living                                           Prior Functioning/Environment              Frequency       Progress Toward Goals  OT Goals(current goals can now be found in the care plan section)  Progress towards OT goals: Progressing toward goals     Plan      Co-evaluation                 End of Session     Activity Tolerance Patient tolerated treatment well   Patient Left in bed;with call bell/phone within reach   Nurse Communication  (pt having little output when urinating  with OT (twice today))        Time: 1432-1446 OT Time Calculation (min): 14 min  Charges: OT General Charges $OT Visit: 1 Procedure OT Treatments $Self Care/Home Management : 8-22 mins  Abbas Beyene 05/24/2014, 2:59 PM Lesle Chris, OTR/L [phone] 05/24/2014

## 2014-05-24 NOTE — Plan of Care (Signed)
Problem: Discharge Progression Outcomes Goal: Anticoagulant follow-up in place Outcome: Not Applicable Date Met:  28/67/51 xarelto

## 2014-05-24 NOTE — Progress Notes (Signed)
Occupational Therapy Treatment Patient Details Name: [name] [name] MRN: [id number] DOB: 1946/07/08 Today's Date: 05/24/2014    History of present illness R THR - post approach - PWB   OT comments  Pt is making good progress with OT. Wife present for session.    Follow Up Recommendations  Home health OT    Equipment Recommendations  None recommended by OT    Recommendations for Other Services      Precautions / Restrictions Precautions Precautions: Posterior Hip Precaution Booklet Issued: Yes (comment) Precaution Comments: sign hung in room; Pt recalls 2/3 THP without cues Restrictions Weight Bearing Restrictions: Yes RLE Weight Bearing: Partial weight bearing RLE Partial Weight Bearing Percentage or Pounds: 50%       Mobility Bed Mobility Overal bed mobility: Needs Assistance Bed Mobility: Supine to Sit     Supine to sit: Min guard     General bed mobility comments: cues for sequence and use of L LE to self assist  Transfers Overall transfer level: Needs assistance Equipment used: Rolling walker (2 wheeled) Transfers: Sit to/from Stand Sit to Stand: Min guard         General transfer comment: cues for UEs/RLE     Balance                                   ADL Overall ADL's : Needs assistance/impaired                         Toilet Transfer: Minimal assistance;Ambulation;BSC   Toileting- Water quality scientist and Hygiene: Min guard;Sit to/from stand   Tub/ Shower Transfer: Walk-in shower;Minimal assistance;Ambulation     General ADL Comments: educated on and practiced bathroom transfers.  Pt stood with walker straddled over commode to urinate.  His commode and shower seat are 22" and our BSC is 21".  Pt cued for extending leg for THPs.  Wife brought long shoehorn she bought which will work for him.       Vision                     Perception     Praxis      Cognition   Behavior During Therapy: [facility] for tasks  assessed/performed Overall Cognitive Status: Within Functional Limits for tasks assessed                       Extremity/Trunk Assessment               Exercises    Shoulder Instructions       General Comments      Pertinent Vitals/ Pain   No c/o pain during OT.  Repositioned and ice reapplied  Home Living                                          Prior Functioning/Environment              Frequency       Progress Toward Goals  OT Goals(current goals can now be found in the care plan section)  Progress towards OT goals: Progressing toward goals  Acute Rehab OT Goals Patient Stated Goal: Walk without pain  Plan      Co-evaluation  End of Session     Activity Tolerance Patient tolerated treatment well   Patient Left in chair;with call bell/phone within reach;with family/visitor present;with nursing/sitter in room   Nurse Communication          Time: 7494-4967 OT Time Calculation (min): 15 min  Charges: OT General Charges $OT Visit: 1 Procedure OT Treatments $Self Care/Home Management : 8-22 mins  Fernanda Twaddell 05/24/2014, 1:56 PM  Lesle Chris, OTR/L [phone] 05/24/2014

## 2014-05-24 NOTE — Progress Notes (Signed)
Subjective: 2 Days Post-Op Procedure(s) (LRB): RIGHT TOTAL HIP ARTHROPLASTY (Right) Patient reports pain as 3 on 0-10 scaleDoing Well..    Objective: Vital signs in last 24 hours: Temp:  [98.2 F (36.8 C)-98.7 F (37.1 C)] 98.2 F (36.8 C) (07/18 0610) Pulse Rate:  [72-84] 76 (07/18 0610) Resp:  [16-20] 18 (07/18 0610) BP: (111-119)/(43-65) 111/65 mmHg (07/18 0610) SpO2:  [93 %-96 %] 95 % (07/18 0610)  Intake/Output from previous day: 07/17 0701 - 07/18 0700 In: 1683.3 [P.O.:720; I.V.:963.3] Out: 475 [Urine:475] Intake/Output this shift:     Recent Labs  05/23/14 0533 05/24/14 0519  HGB 9.6* 9.4*    Recent Labs  05/23/14 0533 05/24/14 0519  WBC 10.7* 11.7*  RBC 3.10* 3.07*  HCT 28.7* 28.1*  PLT 134* 143*    Recent Labs  05/23/14 0533 05/24/14 0519  NA 139 138  K 4.7 4.6  CL 103 103  CO2 28 26  BUN 16 19  CREATININE 0.92 0.88  GLUCOSE 125* 129*  CALCIUM 8.7 9.2   No results found for this basename: LABPT, INR,  in the last 72 hours  Dorsiflexion/Plantar flexion intact Compartment soft  Assessment/Plan: 2 Days Post-Op Procedure(s) (LRB): RIGHT TOTAL HIP ARTHROPLASTY (Right) Discharge home with home health  Elleni Mozingo A 05/24/2014, 8:07 AM

## 2014-05-24 NOTE — Progress Notes (Signed)
Physical Therapy Treatment Patient Details Name: [name] [name] MRN: [id number] DOB: [birth year] Today's Date: 05/24/2014    History of Present Illness R THR - post approach - PWB    PT Comments    Progressing steadily.  Pt able to get self OOB with min guard and VCs  Follow Up Recommendations  Home health PT     Equipment Recommendations  None recommended by PT    Recommendations for Other Services OT consult     Precautions / Restrictions Precautions Precautions: Posterior Hip Precaution Booklet Issued: Yes (comment) Precaution Comments: sign hung in room; Pt recalls 2/3 THP without cues Restrictions Weight Bearing Restrictions: Yes RLE Weight Bearing: Partial weight bearing RLE Partial Weight Bearing Percentage or Pounds: 50%    Mobility  Bed Mobility Overal bed mobility: Needs Assistance Bed Mobility: Supine to Sit     Supine to sit: Min guard     General bed mobility comments: cues for sequence and use of L LE to self assist  Transfers Overall transfer level: Needs assistance Equipment used: Rolling walker (2 wheeled) Transfers: Sit to/from Stand Sit to Stand: Min guard;From elevated surface         General transfer comment: cues for LE management, adherence to THP and use of UEs to self assist  Ambulation/Gait Ambulation/Gait assistance: Min guard Ambulation Distance (Feet): 85 Feet Assistive device: Rolling walker (2 wheeled) Gait Pattern/deviations: Step-to pattern;Step-through pattern;Decreased step length - right;Decreased step length - left;Shuffle;Trunk flexed     General Gait Details: cues for posture, position from RW, PWB on R and sequence   Stairs            Wheelchair Mobility    Modified Rankin (Stroke Patients Only)       Balance                                    Cognition Arousal/Alertness: Awake/alert Behavior During Therapy: WFL for tasks assessed/performed Overall Cognitive Status: Within Functional  Limits for tasks assessed                      Exercises Total Joint Exercises Ankle Circles/Pumps: AROM;Both;15 reps;Supine Quad Sets: AROM;Both;10 reps;Supine Gluteal Sets: AROM;Both;10 reps;Supine Heel Slides: AAROM;Right;Supine;20 reps Hip ABduction/ADduction: AAROM;Right;Supine;20 reps    General Comments        Pertinent Vitals/Pain 3/10:premed, ice pack provided    Home Living                      Prior Function            PT Goals (current goals can now be found in the care plan section) Acute Rehab PT Goals Patient Stated Goal: Walk without pain PT Goal Formulation: With patient Time For Goal Achievement: 05/29/14 Potential to Achieve Goals: Good Progress towards PT goals: Progressing toward goals    Frequency  7X/week    PT Plan Current plan remains appropriate    Co-evaluation             End of Session Equipment Utilized During Treatment: Gait belt Activity Tolerance: Patient tolerated treatment well Patient left: Other (comment) (in bathroom)     Time: 0626-9485 PT Time Calculation (min): 32 min  Charges:  $Gait Training: 8-22 mins $Therapeutic Exercise: 8-22 mins                    G Codes:  [name] [name] 05/24/2014, 1:08 PM

## 2014-05-25 DIAGNOSIS — IMO0001 Reserved for inherently not codable concepts without codable children: Secondary | ICD-10-CM | POA: Diagnosis not present

## 2014-05-25 DIAGNOSIS — Z96649 Presence of unspecified artificial hip joint: Secondary | ICD-10-CM | POA: Diagnosis not present

## 2014-05-25 DIAGNOSIS — D649 Anemia, unspecified: Secondary | ICD-10-CM | POA: Diagnosis not present

## 2014-05-25 DIAGNOSIS — Z471 Aftercare following joint replacement surgery: Secondary | ICD-10-CM | POA: Diagnosis not present

## 2014-05-25 DIAGNOSIS — I10 Essential (primary) hypertension: Secondary | ICD-10-CM | POA: Diagnosis not present

## 2014-05-25 DIAGNOSIS — Z7901 Long term (current) use of anticoagulants: Secondary | ICD-10-CM | POA: Diagnosis not present

## 2014-05-26 NOTE — Progress Notes (Signed)
Physician Discharge Summary     Patient ID: [name] [name] MRN: [id number] DOB/AGE: 68/08/1946 68 y.o.   Admit date: 05/22/2014 Discharge date: 05/24/2014   Primary Diagnosis: Osteoarthritis, right hip   Admission Diagnoses:  Past Medical History   Diagnosis  Date   .  Hypertension     .  Arthritis         BIL KNEES   .  Eczema     .  History of kidney stones     .  Adrenal gland cyst     .  Herpesviral conjunctivitis         hx of in past causing need for corneal transplant rt eye    Discharge Diagnoses:    Active Problems:   Osteoarthritis of right hip   History of total right hip arthroplasty   Postoperative anemia due to acute blood loss Estimated body mass index is 34.31 kg/(m^2) as calculated from the following:   Height as of this encounter: 6' (1.829 m).   Weight as of this encounter: 114.76 kg (253 lb).   Procedure(s) (LRB): RIGHT TOTAL HIP ARTHROPLASTY (Right)    Consults: None   HPI: [name] [name], 68 y.o. male, has a history of pain and functional disability in the right hip(s) due to arthritis and patient has failed non-surgical conservative treatments for greater than 12 weeks to include NSAID's and/or analgesics, flexibility and strengthening excercises and activity modification. Onset of symptoms was gradual starting 1 year ago with gradually worsening course since that time.The patient noted no past surgery on the right hip(s). Patient currently rates pain in the right hip at 6 out of 10 with activity. Patient has night pain, worsening of pain with activity and weight bearing, pain that interfers with activities of daily living and pain with passive range of motion. Patient has evidence of periarticular osteophytes and joint space narrowing by imaging studies. This condition presents safety issues increasing the risk of falls. There is no current active infection.        Laboratory Data: Admission on 05/22/2014, Discharged on 05/24/2014   Component  Date   Value  Ref Range  Status   .  ABO/RH(D)  05/22/2014  A POS     Final   .  Antibody Screen  05/22/2014  NEG     Final   .  Sample Expiration  05/22/2014  05/25/2014     Final   .  ABO/RH(D)  05/22/2014  A POS     Final   .  WBC  05/23/2014  10.7*  4.0 - 10.5 K/uL  Final   .  RBC  05/23/2014  3.10*  4.22 - 5.81 MIL/uL  Final   .  Hemoglobin  05/23/2014  9.6*  13.0 - 17.0 g/dL  Final   .  HCT  05/23/2014  28.7*  39.0 - 52.0 %  Final   .  MCV  05/23/2014  92.6   78.0 - 100.0 fL  Final   .  MCH  05/23/2014  31.0   26.0 - 34.0 pg  Final   .  MCHC  05/23/2014  33.4   30.0 - 36.0 g/dL  Final   .  RDW  05/23/2014  14.5   11.5 - 15.5 %  Final   .  Platelets  05/23/2014  134*  150 - 400 K/uL  Final   .  Sodium  05/23/2014  139   137 - 147 mEq/L  Final   .  Potassium    05/23/2014  4.7   3.7 - 5.3 mEq/L  Final   .  Chloride  05/23/2014  103   96 - 112 mEq/L  Final   .  CO2  05/23/2014  28   19 - 32 mEq/L  Final   .  Glucose, Bld  05/23/2014  125*  70 - 99 mg/dL  Final   .  BUN  05/23/2014  16   6 - 23 mg/dL  Final   .  Creatinine, Ser  05/23/2014  0.92   0.50 - 1.35 mg/dL  Final   .  Calcium  05/23/2014  8.7   8.4 - 10.5 mg/dL  Final   .  GFR calc non Af Amer  05/23/2014  85*  >90 mL/min  Final   .  GFR calc Af Amer  05/23/2014  >90   >90 mL/min  Final     Comment: (NOTE)                            The eGFR has been calculated using the CKD EPI equation.                            This calculation has not been validated in all clinical situations.                            eGFR's persistently <90 mL/min signify possible Chronic Kidney                            Disease.   .  Anion gap  05/23/2014  8   5 - 15  Final   .  WBC  05/24/2014  11.7*  4.0 - 10.5 K/uL  Final   .  RBC  05/24/2014  3.07*  4.22 - 5.81 MIL/uL  Final   .  Hemoglobin  05/24/2014  9.4*  13.0 - 17.0 g/dL  Final   .  HCT  05/24/2014  28.1*  39.0 - 52.0 %  Final   .  MCV  05/24/2014  91.5   78.0 - 100.0 fL  Final   .  MCH   05/24/2014  30.6   26.0 - 34.0 pg  Final   .  MCHC  05/24/2014  33.5   30.0 - 36.0 g/dL  Final   .  RDW  05/24/2014  14.5   11.5 - 15.5 %  Final   .  Platelets  05/24/2014  143*  150 - 400 K/uL  Final   .  Sodium  05/24/2014  138   137 - 147 mEq/L  Final   .  Potassium  05/24/2014  4.6   3.7 - 5.3 mEq/L  Final   .  Chloride  05/24/2014  103   96 - 112 mEq/L  Final   .  CO2  05/24/2014  26   19 - 32 mEq/L  Final   .  Glucose, Bld  05/24/2014  129*  70 - 99 mg/dL  Final   .  BUN  05/24/2014  19   6 - 23 mg/dL  Final   .  Creatinine, Ser  05/24/2014  0.88   0.50 - 1.35 mg/dL  Final   .  Calcium  05/24/2014  9.2   8.4 - 10.5 mg/dL  Final   .    GFR calc non Af Amer  05/24/2014  86*  >90 mL/min  Final   .  GFR calc Af Amer  05/24/2014  >90   >90 mL/min  Final     Comment: (NOTE)                            The eGFR has been calculated using the CKD EPI equation.                            This calculation has not been validated in all clinical situations.                            eGFR's persistently <90 mL/min signify possible Chronic Kidney                            Disease.   .  Anion gap  05/24/2014  9   5 - 15  Final   Hospital Outpatient Visit on 05/16/2014   Component  Date  Value  Ref Range  Status   .  WBC  05/16/2014  6.9   4.0 - 10.5 K/uL  Final   .  RBC  05/16/2014  3.96*  4.22 - 5.81 MIL/uL  Final   .  Hemoglobin  05/16/2014  12.2*  13.0 - 17.0 g/dL  Final   .  HCT  05/16/2014  36.6*  39.0 - 52.0 %  Final   .  MCV  05/16/2014  92.4   78.0 - 100.0 fL  Final   .  MCH  05/16/2014  30.8   26.0 - 34.0 pg  Final   .  MCHC  05/16/2014  33.3   30.0 - 36.0 g/dL  Final   .  RDW  05/16/2014  14.1   11.5 - 15.5 %  Final   .  Platelets  05/16/2014  147*  150 - 400 K/uL  Final   .  MRSA, PCR  05/16/2014  NEGATIVE   NEGATIVE  Final   .  Staphylococcus aureus  05/16/2014  NEGATIVE   NEGATIVE  Final     Comment:                                   The Xpert SA Assay (FDA                             approved for NASAL specimens                            in patients over 21 years of age),                            is one component of                            a comprehensive surveillance                            program.  Test performance has                              been validated by [facility] for patients greater                            than or equal to 1 year old.                            It is not intended                            to diagnose infection nor to                            guide or monitor treatment.   .  aPTT  05/16/2014  25   24 - 37 seconds  Final   .  Sodium  05/16/2014  142   137 - 147 mEq/L  Final   .  Potassium  05/16/2014  4.7   3.7 - 5.3 mEq/L  Final   .  Chloride  05/16/2014  106   96 - 112 mEq/L  Final   .  CO2  05/16/2014  26   19 - 32 mEq/L  Final   .  Glucose, Bld  05/16/2014  95   70 - 99 mg/dL  Final   .  BUN  05/16/2014  23   6 - 23 mg/dL  Final   .  Creatinine, Ser  05/16/2014  0.94   0.50 - 1.35 mg/dL  Final   .  Calcium  05/16/2014  9.8   8.4 - 10.5 mg/dL  Final   .  Total Protein  05/16/2014  6.9   6.0 - 8.3 g/dL  Final   .  Albumin  05/16/2014  3.9   3.5 - 5.2 g/dL  Final   .  AST  05/16/2014  21   0 - 37 U/L  Final   .  ALT  05/16/2014  28   0 - 53 U/L  Final   .  Alkaline Phosphatase  05/16/2014  67   39 - 117 U/L  Final   .  Total Bilirubin  05/16/2014  0.6   0.3 - 1.2 mg/dL  Final   .  GFR calc non Af Amer  05/16/2014  84*  >90 mL/min  Final   .  GFR calc Af Amer  05/16/2014  >90   >90 mL/min  Final     Comment: (NOTE)                            The eGFR has been calculated using the CKD EPI equation.                            This calculation has not been validated in all clinical situations.                            eGFR's persistently <90 mL/min signify possible Chronic Kidney                              Disease.   .  Anion gap  05/16/2014  10   5 - 15  Final   .  Prothrombin Time   05/16/2014  12.7   11.6 - 15.2 seconds  Final   .  INR  05/16/2014  0.95   0.00 - 1.49  Final   .  Color, Urine  05/16/2014  YELLOW   YELLOW  Final   .  APPearance  05/16/2014  CLEAR   CLEAR  Final   .  Specific Gravity, Urine  05/16/2014  1.024   1.005 - 1.030  Final   .  pH  05/16/2014  5.5   5.0 - 8.0  Final   .  Glucose, UA  05/16/2014  NEGATIVE   NEGATIVE mg/dL  Final   .  Hgb urine dipstick  05/16/2014  TRACE*  NEGATIVE  Final   .  Bilirubin Urine  05/16/2014  NEGATIVE   NEGATIVE  Final   .  Ketones, ur  05/16/2014  NEGATIVE   NEGATIVE mg/dL  Final   .  Protein, ur  05/16/2014  NEGATIVE   NEGATIVE mg/dL  Final   .  Urobilinogen, UA  05/16/2014  0.2   0.0 - 1.0 mg/dL  Final   .  Nitrite  05/16/2014  NEGATIVE   NEGATIVE  Final   .  Leukocytes, UA  05/16/2014  NEGATIVE   NEGATIVE  Final   .  RBC / HPF  05/16/2014  3-6   <3 RBC/hpf  Final   .  Bacteria, UA  05/16/2014  FEW*  RARE  Final   .  Urine-Other  05/16/2014  MUCOUS PRESENT     Final      X-Rays:Dg Chest 2 View   05/16/2014   CLINICAL DATA:  Hypertension.  EXAM: CHEST  2 VIEW  COMPARISON:  October 13, 2011.  FINDINGS: The heart size and mediastinal contours are within normal limits. Both lungs are clear. No pneumothorax or pleural effusion is noted. The visualized skeletal structures are unremarkable.  IMPRESSION: No acute cardiopulmonary abnormality seen.   Electronically Signed   By: James  Green M.D.   On: 05/16/2014 14:58    Dg Hip Portable 1 View Right   05/22/2014   CLINICAL DATA:  Status post right hip replacement  EXAM: PORTABLE RIGHT HIP - 1 VIEW  COMPARISON:  None.  FINDINGS: A new right hip prosthesis is noted. The femoral component is well seated. A surgical drain is seen. No acute abnormality is noted.   Electronically Signed   By: Mark  Lukens M.D.   On: 05/22/2014 10:27      Hospital Course: Patient was admitted to [facility] and taken to the OR and underwent the above state procedure without  complications.  Patient tolerated the procedure well and was later transferred to the recovery room and then to the orthopaedic floor for postoperative care.  They were given PO and IV analgesics for pain control following their surgery.  They were given 24 hours of postoperative antibiotics of   Anti-infectives     Start        Dose/Rate  Route  Frequency  Ordered  Stop     05/22/14 1400    clindamycin (CLEOCIN) IVPB 600 mg        600 mg 100 mL/hr over 30 Minutes  Intravenous  Every 6 hours  05/22/14 1136  05/23/14 0159     05/22/14 0839    polymyxin B 500,000 Units, bacitracin 50,000 Units in   sodium chloride irrigation 0.9 % 500 mL irrigation  Status:  Discontinued            As needed  05/22/14 0859  05/22/14 0939     05/22/14 0715    clindamycin (CLEOCIN) IVPB 900 mg        900 mg 100 mL/hr over 30 Minutes  Intravenous   Once  05/22/14 0702  05/22/14 0737       and started on DVT prophylaxis in the form of Xarelto.   PT and OT were ordered for total hip protocol.  The patient was allowed to be WBAT with therapy. Discharge planning was consulted to help with postop disposition and equipment needs.  Patient had a good night on the evening of surgery.  They started to get up OOB with therapy on day one.  Hemovac drain was pulled without difficulty.  The knee immobilizer was removed and discontinued.  Continued to work with therapy into day two. Patient had progressed with therapy and meeting their goals.  Incision was healing well.  Patient was seen in rounds and was ready to go home.     Diet: Regular diet Activity:PWB 50% right LE No bending hip over 90 degrees- A "L" Angle Do not cross legs Do not let foot roll inward When turning these patients a pillow should be placed between the patient's legs to prevent crossing. Patients should have the affected knee fully extended when trying to sit or stand from all surfaces to prevent excessive hip flexion. When ambulating and turning toward  the affected side the affected leg should have the toes turned out prior to moving the walker and the rest of patient's body as to prevent internal rotation/ turning in of the leg. Abduction pillows are the most effective way to prevent a patient from not crossing legs or turning toes in at rest. If an abduction pillow is not ordered placing a regular pillow length wise between the patient's legs is also an effective reminder. It is imperative that these precautions be maintained so that the surgical hip does not dislocate. Follow-up:in 2 weeks Disposition - Home Discharged Condition: stable      Discharge Instructions     Call MD / Call 911     Complete by:  As directed     If you experience chest pain or shortness of breath, CALL 911 and be transported to the hospital emergency room.  If you develope a fever above 101 F, pus (white drainage) or increased drainage or redness at the wound, or calf pain, call your surgeon's office.         Constipation Prevention     Complete by:  As directed     Drink plenty of fluids.  Prune juice may be helpful.  You may use a stool softener, such as Colace (over the counter) 100 mg twice a day.  Use MiraLax (over the counter) for constipation as needed.         Diet - low sodium heart healthy     Complete by:  As directed          Discharge instructions     Complete by:  As directed     Walk with your walker. Partial weightbearing right LE for one week. Then progress to weightbearing as tolerated.   Home Health Agency will follow you at home for your therapy Change your dressing over the drain site daily or as needed. Do not change the dressing over the incision.   

## 2014-05-27 ENCOUNTER — Encounter (HOSPITAL_COMMUNITY): Payer: Self-pay | Admitting: Emergency Medicine

## 2014-05-27 ENCOUNTER — Inpatient Hospital Stay (HOSPITAL_COMMUNITY)
Admission: EM | Admit: 2014-05-27 | Discharge: 2014-05-31 | DRG: 682 | Disposition: A | Discharge status: Home Health | Payer: Medicare Other | Attending: Internal Medicine | Admitting: Internal Medicine

## 2014-05-27 ENCOUNTER — Observation Stay (HOSPITAL_COMMUNITY): Payer: Medicare Other

## 2014-05-27 ENCOUNTER — Emergency Department (HOSPITAL_COMMUNITY): Payer: Medicare Other

## 2014-05-27 DIAGNOSIS — R159 Full incontinence of feces: Secondary | ICD-10-CM | POA: Diagnosis not present

## 2014-05-27 DIAGNOSIS — E8779 Other fluid overload: Secondary | ICD-10-CM | POA: Diagnosis not present

## 2014-05-27 DIAGNOSIS — Z87891 Personal history of nicotine dependence: Secondary | ICD-10-CM

## 2014-05-27 DIAGNOSIS — R0602 Shortness of breath: Secondary | ICD-10-CM | POA: Diagnosis not present

## 2014-05-27 DIAGNOSIS — Z79899 Other long term (current) drug therapy: Secondary | ICD-10-CM | POA: Diagnosis not present

## 2014-05-27 DIAGNOSIS — R609 Edema, unspecified: Secondary | ICD-10-CM | POA: Diagnosis not present

## 2014-05-27 DIAGNOSIS — R5381 Other malaise: Secondary | ICD-10-CM | POA: Diagnosis not present

## 2014-05-27 DIAGNOSIS — I1310 Hypertensive heart and chronic kidney disease without heart failure, with stage 1 through stage 4 chronic kidney disease, or unspecified chronic kidney disease: Secondary | ICD-10-CM | POA: Diagnosis not present

## 2014-05-27 DIAGNOSIS — R4182 Altered mental status, unspecified: Secondary | ICD-10-CM | POA: Diagnosis not present

## 2014-05-27 DIAGNOSIS — E872 Acidosis, unspecified: Secondary | ICD-10-CM | POA: Diagnosis present

## 2014-05-27 DIAGNOSIS — N189 Chronic kidney disease, unspecified: Secondary | ICD-10-CM | POA: Diagnosis present

## 2014-05-27 DIAGNOSIS — D649 Anemia, unspecified: Secondary | ICD-10-CM

## 2014-05-27 DIAGNOSIS — M7989 Other specified soft tissue disorders: Secondary | ICD-10-CM | POA: Diagnosis not present

## 2014-05-27 DIAGNOSIS — R32 Unspecified urinary incontinence: Secondary | ICD-10-CM | POA: Diagnosis present

## 2014-05-27 DIAGNOSIS — Z820 Family history of epilepsy and other diseases of the nervous system: Secondary | ICD-10-CM | POA: Diagnosis not present

## 2014-05-27 DIAGNOSIS — Z96649 Presence of unspecified artificial hip joint: Secondary | ICD-10-CM

## 2014-05-27 DIAGNOSIS — E8770 Fluid overload, unspecified: Secondary | ICD-10-CM

## 2014-05-27 DIAGNOSIS — N179 Acute kidney failure, unspecified: Secondary | ICD-10-CM | POA: Diagnosis not present

## 2014-05-27 DIAGNOSIS — R635 Abnormal weight gain: Secondary | ICD-10-CM | POA: Diagnosis present

## 2014-05-27 DIAGNOSIS — I10 Essential (primary) hypertension: Secondary | ICD-10-CM

## 2014-05-27 DIAGNOSIS — M1611 Unilateral primary osteoarthritis, right hip: Secondary | ICD-10-CM

## 2014-05-27 DIAGNOSIS — D72829 Elevated white blood cell count, unspecified: Secondary | ICD-10-CM | POA: Diagnosis not present

## 2014-05-27 DIAGNOSIS — N289 Disorder of kidney and ureter, unspecified: Secondary | ICD-10-CM | POA: Diagnosis not present

## 2014-05-27 DIAGNOSIS — I517 Cardiomegaly: Secondary | ICD-10-CM | POA: Diagnosis not present

## 2014-05-27 DIAGNOSIS — I5031 Acute diastolic (congestive) heart failure: Secondary | ICD-10-CM | POA: Diagnosis present

## 2014-05-27 DIAGNOSIS — D62 Acute posthemorrhagic anemia: Secondary | ICD-10-CM

## 2014-05-27 DIAGNOSIS — Z96641 Presence of right artificial hip joint: Secondary | ICD-10-CM

## 2014-05-27 DIAGNOSIS — R5383 Other fatigue: Secondary | ICD-10-CM | POA: Diagnosis not present

## 2014-05-27 DIAGNOSIS — M79609 Pain in unspecified limb: Secondary | ICD-10-CM | POA: Diagnosis not present

## 2014-05-27 HISTORY — DX: Essential (primary) hypertension: I10

## 2014-05-27 LAB — TROPONIN I
Troponin I: <0.3 ng/mL (ref <0.30)
Troponin I: <0.3 ng/mL (ref <0.30)

## 2014-05-27 LAB — SAMPLE TO BLOOD BANK

## 2014-05-27 LAB — CBC WITH DIFFERENTIAL/PLATELET
Basophils Absolute: 0 10*3/uL (ref 0.0–0.1)
Basophils Relative: 0 % (ref 0–1)
Eosinophils Absolute: 0 10*3/uL (ref 0.0–0.7)
Eosinophils Relative: 0 % (ref 0–5)
HCT: 23.3 % — ABNORMAL LOW (ref 39.0–52.0)
Hemoglobin: 8.2 g/dL — ABNORMAL LOW (ref 13.0–17.0)
Lymphocytes Relative: 21 % (ref 12–46)
Lymphs Abs: 2.8 10*3/uL (ref 0.7–4.0)
MCH: 31.2 pg (ref 26.0–34.0)
MCHC: 35.2 g/dL (ref 30.0–36.0)
MCV: 88.6 fL (ref 78.0–100.0)
Monocytes Absolute: 1.7 10*3/uL — ABNORMAL HIGH (ref 0.1–1.0)
Monocytes Relative: 12 % (ref 3–12)
Neutro Abs: 9.2 10*3/uL — ABNORMAL HIGH (ref 1.7–7.7)
Neutrophils Relative %: 67 % (ref 43–77)
Platelets: 197 10*3/uL (ref 150–400)
RBC: 2.63 MIL/uL — ABNORMAL LOW (ref 4.22–5.81)
RDW: 14.4 % (ref 11.5–15.5)
WBC: 13.7 10*3/uL — ABNORMAL HIGH (ref 4.0–10.5)

## 2014-05-27 LAB — HEPATIC FUNCTION PANEL
ALT: 38 U/L (ref 0–53)
AST: 35 U/L (ref 0–37)
Albumin: 2.8 g/dL — ABNORMAL LOW (ref 3.5–5.2)
Alkaline Phosphatase: 78 U/L (ref 39–117)
Bilirubin, Direct: 0.3 mg/dL (ref 0.0–0.3)
Indirect Bilirubin: 0.9 mg/dL (ref 0.3–0.9)
Total Bilirubin: 1.2 mg/dL (ref 0.3–1.2)
Total Protein: 6 g/dL (ref 6.0–8.3)

## 2014-05-27 LAB — BASIC METABOLIC PANEL
Anion gap: 19 — ABNORMAL HIGH (ref 5–15)
BUN: 85 mg/dL — ABNORMAL HIGH (ref 6–23)
CO2: 17 mEq/L — ABNORMAL LOW (ref 19–32)
Calcium: 8.7 mg/dL (ref 8.4–10.5)
Chloride: 97 mEq/L (ref 96–112)
Creatinine, Ser: 3.78 mg/dL — ABNORMAL HIGH (ref 0.50–1.35)
GFR calc Af Amer: 17 mL/min — ABNORMAL LOW (ref >90)
GFR calc non Af Amer: 15 mL/min — ABNORMAL LOW (ref >90)
Glucose, Bld: 113 mg/dL — ABNORMAL HIGH (ref 70–99)
Potassium: 4.4 mEq/L (ref 3.7–5.3)
Sodium: 133 mEq/L — ABNORMAL LOW (ref 137–147)

## 2014-05-27 LAB — FERRITIN: Ferritin: 493 ng/mL — ABNORMAL HIGH (ref 22–322)

## 2014-05-27 LAB — PROTIME-INR
INR: 1.71 — ABNORMAL HIGH (ref 0.00–1.49)
Prothrombin Time: 20.1 seconds — ABNORMAL HIGH (ref 11.6–15.2)

## 2014-05-27 LAB — MAGNESIUM
Magnesium: 2.5 mg/dL (ref 1.5–2.5)
Magnesium: 2.6 mg/dL — ABNORMAL HIGH (ref 1.5–2.5)

## 2014-05-27 LAB — URINALYSIS, ROUTINE W REFLEX MICROSCOPIC
Bilirubin Urine: NEGATIVE
Glucose, UA: NEGATIVE mg/dL
Ketones, ur: NEGATIVE mg/dL
Nitrite: NEGATIVE
Protein, ur: NEGATIVE mg/dL
Specific Gravity, Urine: 1.015 (ref 1.005–1.030)
Urobilinogen, UA: 0.2 mg/dL (ref 0.0–1.0)
pH: 5 (ref 5.0–8.0)

## 2014-05-27 LAB — IRON AND TIBC
Iron: 16 ug/dL — ABNORMAL LOW (ref 42–135)
Saturation Ratios: 7 % — ABNORMAL LOW (ref 20–55)
TIBC: 233 ug/dL (ref 215–435)
UIBC: 217 ug/dL (ref 125–400)

## 2014-05-27 LAB — URINE MICROSCOPIC-ADD ON

## 2014-05-27 LAB — VITAMIN B12: Vitamin B-12: 846 pg/mL (ref 211–911)

## 2014-05-27 LAB — SAVE SMEAR

## 2014-05-27 LAB — FOLATE: Folate: 12.8 ng/mL

## 2014-05-27 LAB — PRO B NATRIURETIC PEPTIDE: Pro B Natriuretic peptide (BNP): 115.2 pg/mL (ref 0–125)

## 2014-05-27 LAB — LACTATE DEHYDROGENASE: LDH: 347 U/L — ABNORMAL HIGH (ref 94–250)

## 2014-05-27 LAB — APTT: aPTT: 40 seconds — ABNORMAL HIGH (ref 24–37)

## 2014-05-27 LAB — TSH: TSH: 0.666 u[IU]/mL (ref 0.350–4.500)

## 2014-05-27 LAB — D-DIMER, QUANTITATIVE: D-Dimer, Quant: 3.5 ug/mL-FEU — ABNORMAL HIGH (ref 0.00–0.48)

## 2014-05-27 MED ORDER — AMMONIUM LACTATE 12 % EX LOTN: 1.0000 "application " | TOPICAL_LOTION | Freq: Three times a day (TID) | CUTANEOUS | Status: DC | PRN

## 2014-05-27 MED ORDER — SODIUM CHLORIDE 0.9 % IJ SOLN: 3.0000 mL | Freq: Two times a day (BID) | INTRAMUSCULAR | Status: DC

## 2014-05-27 MED ORDER — SODIUM CHLORIDE 0.9 % IV SOLN: 250.0000 mL | INTRAVENOUS | Status: DC | PRN

## 2014-05-27 MED ORDER — OCUVITE PO TABS
1.0000 | ORAL_TABLET | Freq: Every day | ORAL | Status: DC
  Administered 2014-05-27 – 2014-05-31 (×5): 1 via ORAL
  Filled 2014-05-27 (×5): qty 1

## 2014-05-27 MED ORDER — DOCUSATE SODIUM 100 MG PO CAPS
100.0000 mg | ORAL_CAPSULE | Freq: Two times a day (BID) | ORAL | Status: DC
  Administered 2014-05-27 – 2014-05-29 (×4): 100 mg via ORAL
  Filled 2014-05-27 (×9): qty 1

## 2014-05-27 MED ORDER — FUROSEMIDE 10 MG/ML IJ SOLN
40.0000 mg | Freq: Two times a day (BID) | INTRAMUSCULAR | Status: DC
  Administered 2014-05-27 – 2014-05-28 (×2): 40 mg via INTRAVENOUS
  Filled 2014-05-27 (×5): qty 4

## 2014-05-27 MED ORDER — ACETAMINOPHEN 650 MG RE SUPP: 650.0000 mg | Freq: Four times a day (QID) | RECTAL | Status: DC | PRN

## 2014-05-27 MED ORDER — RIVAROXABAN 10 MG PO TABS: 10.0000 mg | ORAL_TABLET | Freq: Every day | ORAL | Status: DC

## 2014-05-27 MED ORDER — SAW PALMETTO 160 MG PO CAPS: 160.0000 mg | ORAL_CAPSULE | Freq: Every day | ORAL | Status: DC

## 2014-05-27 MED ORDER — SODIUM CHLORIDE 0.9 % IJ SOLN
3.0000 mL | Freq: Two times a day (BID) | INTRAMUSCULAR | Status: DC
  Administered 2014-05-27: 3 mL via INTRAVENOUS

## 2014-05-27 MED ORDER — SODIUM CHLORIDE 0.9 % IJ SOLN: 3.0000 mL | INTRAMUSCULAR | Status: DC | PRN

## 2014-05-27 MED ORDER — ONDANSETRON HCL 4 MG PO TABS: 4.0000 mg | ORAL_TABLET | Freq: Four times a day (QID) | ORAL | Status: DC | PRN

## 2014-05-27 MED ORDER — HEPARIN SODIUM (PORCINE) 5000 UNIT/ML IJ SOLN
5000.0000 [IU] | Freq: Three times a day (TID) | INTRAMUSCULAR | Status: DC
  Administered 2014-05-28 – 2014-05-31 (×11): 5000 [IU] via SUBCUTANEOUS
  Filled 2014-05-27 (×13): qty 1

## 2014-05-27 MED ORDER — SODIUM BICARBONATE 650 MG PO TABS
650.0000 mg | ORAL_TABLET | Freq: Two times a day (BID) | ORAL | Status: DC
  Administered 2014-05-27: 650 mg via ORAL
  Filled 2014-05-27 (×3): qty 1

## 2014-05-27 MED ORDER — GLUCOSAMINE-CHONDROITIN 500-400 MG PO TABS: 1.0000 | ORAL_TABLET | Freq: Two times a day (BID) | ORAL | Status: DC

## 2014-05-27 MED ORDER — PANTOPRAZOLE SODIUM 40 MG PO TBEC
40.0000 mg | DELAYED_RELEASE_TABLET | Freq: Every day | ORAL | Status: DC
  Administered 2014-05-28 – 2014-05-31 (×4): 40 mg via ORAL
  Filled 2014-05-27 (×5): qty 1

## 2014-05-27 MED ORDER — TECHNETIUM TC 99M DIETHYLENETRIAME-PENTAACETIC ACID: 36.2000 | Freq: Once | INTRAVENOUS | Status: AC | PRN

## 2014-05-27 MED ORDER — ACETAMINOPHEN 325 MG PO TABS: 650.0000 mg | ORAL_TABLET | Freq: Four times a day (QID) | ORAL | Status: DC | PRN

## 2014-05-27 MED ORDER — ALUM & MAG HYDROXIDE-SIMETH 200-200-20 MG/5ML PO SUSP: 30.0000 mL | Freq: Four times a day (QID) | ORAL | Status: DC | PRN

## 2014-05-27 MED ORDER — OXYCODONE-ACETAMINOPHEN 5-325 MG PO TABS
1.0000 | ORAL_TABLET | ORAL | Status: DC | PRN
  Administered 2014-05-27 – 2014-05-31 (×7): 1 via ORAL
  Filled 2014-05-27 (×7): qty 1

## 2014-05-27 MED ORDER — TECHNETIUM TO 99M ALBUMIN AGGREGATED
5.3000 | Freq: Once | INTRAVENOUS | Status: AC | PRN
Start: 2014-05-27 — End: 2014-05-27
  Administered 2014-05-27: 5.3 via INTRAVENOUS

## 2014-05-27 MED ORDER — SODIUM CHLORIDE 0.9 % IV SOLN: INTRAVENOUS | Status: AC

## 2014-05-27 MED ORDER — METHOCARBAMOL 500 MG PO TABS
500.0000 mg | ORAL_TABLET | Freq: Four times a day (QID) | ORAL | Status: DC | PRN
  Administered 2014-05-27 – 2014-05-31 (×6): 500 mg via ORAL
  Filled 2014-05-27 (×6): qty 1

## 2014-05-27 MED ORDER — FERROUS SULFATE 325 (65 FE) MG PO TABS
325.0000 mg | ORAL_TABLET | Freq: Three times a day (TID) | ORAL | Status: DC
  Administered 2014-05-27 – 2014-05-31 (×12): 325 mg via ORAL
  Filled 2014-05-27 (×14): qty 1

## 2014-05-27 MED ORDER — ONDANSETRON HCL 4 MG/2ML IJ SOLN: 4.0000 mg | Freq: Four times a day (QID) | INTRAMUSCULAR | Status: DC | PRN

## 2014-05-27 MED ORDER — ALBUTEROL SULFATE (2.5 MG/3ML) 0.083% IN NEBU: 2.5000 mg | INHALATION_SOLUTION | RESPIRATORY_TRACT | Status: DC | PRN

## 2014-05-27 NOTE — ED Notes (Addendum)
Attempted to call report - RN to return call.  

## 2014-05-27 NOTE — Progress Notes (Signed)
Right lower extremity venous duplex completed.  Right:  No evidence of DVT, superficial thrombosis, or Baker's cyst.  Left:  Negative for DVT in the common femoral vein.  

## 2014-05-27 NOTE — H&P (Signed)
[facility] History and Physical  [name] [name] ZHG:[id number] DOB: 1946/04/28 DOA: 05/27/2014  Referring physician: Dr [name] [name] PCP: [name] Pac, MD  Orthopedics: Dr. Gladstone [name]  Chief Complaint: Shortness of breath/ lower extremity edema  HPI: [name] [name] is a 68 y.o. male  With history of arthritis status post right total hip arthroplasty 05/22/2014, hypertension who presents to the ED with shortness of breath on exertion, worsening bilateral lower extremity edema x3 days, generalized weakness, decreased appetite and decreased urine output. Patient had spoken with urology secondary to decreased urine output was placed on Flomax which patient states has led to some urinary incontinence. Patient denies any fevers, no chills, no chest pain, no nausea, no vomiting, no abdominal pain, no dysuria, no diarrhea, no constipation, no cough, no melena, no hematemesis, no hematochezia. Per wife patient had a 16 pound weight gain over one and a half weeks. Per wife patient's weight was 260 pounds on 05/15/2014. Patient had presented to his PCPs office and was directly to come to the emergency room. Patient was seen in the emergency room where basic metabolic profile done at a sodium of 133 bicarbonate of 17 BUN of 85 creatinine of 3.78 and glucose of 113. First set of troponin was negative. Pro BNP was 115.2. CBC had a white count of 13.7 hemoglobin of 8.2 otherwise was within normal limits. Urinalysis done was nitrite negative trace leukocytes 3-6 WBCs. EKG showed normal sinus rhythm. Chest x-ray done was unremarkable. Lower extremity dopplers done in ED were negative. We will called to admit the patient for further evaluation and management.   Review of Systems:  as per HPI otherwise negative.  Constitutional:  No weight loss, night sweats, Fevers, chills, fatigue.  HEENT:  No headaches, Difficulty swallowing,Tooth/dental problems,Sore throat,  No sneezing, itching, ear ache, nasal congestion,  post nasal drip,  Cardio-vascular:  No chest pain, Orthopnea, PND, swelling in lower extremities, anasarca, dizziness, palpitations  GI:  No heartburn, indigestion, abdominal pain, nausea, vomiting, diarrhea, change in bowel habits, loss of appetite  Resp:  No shortness of breath with exertion or at rest. No excess mucus, no productive cough, No non-productive cough, No coughing up of blood.No change in color of mucus.No wheezing.No chest wall deformity  Skin:  no rash or lesions.  GU:  no dysuria, change in color of urine, no urgency or frequency. No flank pain.  Musculoskeletal:  No joint pain or swelling. No decreased range of motion. No back pain.  Psych:  No change in mood or affect. No depression or anxiety. No memory loss.   Past Medical History  Diagnosis Date  . Hypertension   . Arthritis     BIL KNEES  . Eczema   . History of kidney stones   . Adrenal gland cyst   . Herpesviral conjunctivitis     hx of in past causing need for corneal transplant rt eye  . HTN (hypertension) 05/27/2014   Past Surgical History  Procedure Laterality Date  . Pilonidal cyst excision  1974  . Corneal transplant  1986  . Hernia repair  1981 /2001 /2012    L ING HERNIA REPAIR X 6  . Total hip arthroplasty Right 05/22/2014    Procedure: RIGHT TOTAL HIP ARTHROPLASTY;  Surgeon: [name] Bastos, MD;  Location: [facility];  Service: Orthopedics;  Laterality: Right;   Social History:  reports that he quit smoking about 3 years ago. He has never used smokeless tobacco. He reports that he drinks alcohol. He reports that he does  not use illicit drugs.  Allergies  Allergen Reactions  . Prednisone     Leg cramps  . Penicillins Rash    Family History  Problem Relation Age of Onset  . Alzheimer's disease Mother   . Cancer - Other Father     cancer of the uvula  . Lung cancer Other      Prior to Admission medications   Medication Sig Start Date End Date Taking? Authorizing Provider  ammonium  lactate (LAC-HYDRIN) 12 % lotion Apply 1 application topically 3 (three) times daily as needed for dry skin.   Yes Historical Provider, MD  beta carotene w/minerals (OCUVITE) tablet Take 1 tablet by mouth daily.   Yes Historical Provider, MD  docusate sodium 100 MG CAPS Take 100 mg by mouth 2 (two) times daily. 05/23/14  Yes [name] Renelda Loma, PA-C  ferrous sulfate 325 (65 FE) MG tablet Take 1 tablet (325 mg total) by mouth 3 (three) times daily after meals. 05/23/14  Yes [name] Renelda Loma, PA-C  furosemide (LASIX) 20 MG tablet Take 20 mg by mouth 2 (two) times daily as needed for fluid.    Yes Historical Provider, MD  glucosamine-chondroitin 500-400 MG tablet Take 1 tablet by mouth 2 (two) times daily.   Yes Historical Provider, MD  indomethacin (INDOCIN) 25 MG capsule Take 50 mg by mouth 2 (two) times daily with a meal.   Yes Historical Provider, MD  losartan (COZAAR) 50 MG tablet Take 50 mg by mouth 2 (two) times daily.   Yes Historical Provider, MD  methocarbamol (ROBAXIN) 500 MG tablet Take 1 tablet (500 mg total) by mouth every 6 (six) hours as needed for muscle spasms. 05/23/14  Yes [name] Renelda Loma, PA-C  oxyCODONE-acetaminophen (PERCOCET/ROXICET) 5-325 MG per tablet Take 1 tablet by mouth every 4 (four) hours as needed for moderate pain or severe pain.   Yes Historical Provider, MD  rivaroxaban (XARELTO) 10 MG TABS tablet Take 1 tablet (10 mg total) by mouth daily with breakfast. 05/23/14  Yes [name] Lauren Constable, PA-C  Saw Palmetto 160 MG CAPS Take 160 mg by mouth daily.   Yes Historical Provider, MD   Physical Exam: Filed Vitals:   05/27/14 1600 05/27/14 1630 05/27/14 1707 05/27/14 1734  BP: 106/46 94/52 104/54 149/51  Pulse: 73 75 73 82  Temp:   98.8 F (37.1 C) 98.3 F (36.8 C)  TempSrc:   Oral Oral  Resp: 14  14 16   SpO2: 99% 98% 97% 100%    Wt Readings from Last 3 Encounters:  05/22/14 114.76 kg (253 lb)  05/22/14 114.76 kg (253 lb)  05/16/14 114.76 kg (253  lb)    General:   well-developed well-nourished in no acute cardiopulmonary distress. Speaking in full sentences. Eyes: PERRLA, EOMI, normal lids, irises & conjunctiva ENT: grossly normal hearing, lips & tongue Neck: no LAD, masses or thyromegaly Cardiovascular: RRR, no m/r/g. 3-4+ bilateral lower extremity edema up to the hips. Telemetry:  normal sinus rhythm  Respiratory: CTA bilaterally, no w/r/r. Normal respiratory effort. Abdomen: soft, ntnd,  Positive bowel sounds, no rebound, no guarding Skin:  petechia noted on bilateral lower extremities on the shins.  Musculoskeletal: grossly normal tone BUE/BLE Psychiatric: grossly normal mood and affect, speech fluent and appropriate Neurologic:  alert and oriented x3. Cranial nerves II through XII are grossly intact. No focal deficits. Sensation is intact. Gait not tested secondary to safety.           Labs on Admission:  Basic Metabolic Panel:  Recent Labs Lab 05/23/14 0533 05/24/14 0519 05/27/14 1307 05/27/14 1636  NA 139 138 133*  --   K 4.7 4.6 4.4  --   CL 103 103 97  --   CO2 28 26 17*  --   GLUCOSE 125* 129* 113*  --   BUN 16 19 85*  --   CREATININE 0.92 0.88 3.78*  --   CALCIUM 8.7 9.2 8.7  --   MG  --   --   --  2.5   Liver Function Tests:  Recent Labs Lab 05/27/14 1636  AST 35  ALT 38  ALKPHOS 78  BILITOT 1.2  PROT 6.0  ALBUMIN 2.8*   No results found for this basename: LIPASE, AMYLASE,  in the last 168 hours No results found for this basename: AMMONIA,  in the last 168 hours CBC:  Recent Labs Lab 05/23/14 0533 05/24/14 0519 05/27/14 1307  WBC 10.7* 11.7* 13.7*  NEUTROABS  --   --  9.2*  HGB 9.6* 9.4* 8.2*  HCT 28.7* 28.1* 23.3*  MCV 92.6 91.5 88.6  PLT 134* 143* 197   Cardiac Enzymes:  Recent Labs Lab 05/27/14 1307  TROPONINI <0.30    BNP (last 3 results)  Recent Labs  05/27/14 1307  PROBNP 115.2   CBG: No results found for this basename: GLUCAP,  in the last 168  hours  Radiological Exams on Admission: Dg Chest 2 View  05/27/2014   CLINICAL DATA:  Shortness of breath for 6 days following right hip replacement  EXAM: CHEST  2 VIEW  COMPARISON:  05/16/2014  FINDINGS: Mild cardiac enlargement stable. Vascular pattern normal. Lungs clear. No effusion or pneumothorax.  IMPRESSION: No active cardiopulmonary disease.   Electronically Signed   By: Skipper Cliche M.D.   On: 05/27/2014 13:42    EKG: Independently reviewed. Normal sinus rhythm  Assessment/Plan Principal Problem:   Acute renal failure Active Problems:   Postoperative anemia due to acute blood loss   Volume overload   Acidosis   HTN (hypertension)   Leukocytosis   ARF (acute renal failure)   SOB (shortness of breath)   #1 acute renal failure Unknown etiology. Differential includes a prerenal azotemia versus renal (ATN)[ as patient had been on NSAIDs up until one week preoperatively and none since then in the setting of ARB} versus post renal azotemia. ??HUS may be less likely however per wife patient with some bouts of confusion, patient noted to be anemic with lower extremity petechiae however platelets are normalized. Patient also noted to be volume overloaded on examination. Will admit the patient to telemetry. Check a fractional excretion of sodium. Check a renal ultrasound. Will place a Foley catheter. Will place patient on Lasix 40 mg IV every 12 hours. Follow urinary output. Hold ARB and NSAIDs. Will consult with nephrology for further evaluation and management.  #2 shortness of breath Questionable etiology. Differential includes volume overload as patient noted to have lower extremity edema however chest x-ray was clear versus pulmonary embolism as patient with recent surgery. Patient however was on DVT prophylaxis. D-dimer which I have ordered has come back elevated and a such will get a VQ scan as patient is in acute renal failure. Will cycle cardiac enzymes every 6 hours x3. Check a  2-D echo. Will also place patient on Lasix 40 mg IV every 12 hours for volume overload. Follow.  #3 metabolic acidosis Likely secondary to problem #1. Will place him by tablet. Renal consultation pending.  #4 volume overload  Patient with 3-4+ bilateral lower extremity edema extending up to hips. Pro BNP was normal. Patient noted to be in acute renal failure however urinalysis is negative for any proteins. LFTs are within normal limits. Patient is not on any calcium channel blockers. Will cycle cardiac enzymes every 6 hours x3. Check a 2-D echo. Place on Lasix 40 mg IV every 12 hours. Follow.  #5 hypertension Blood pressure is borderline. Hold ARB secondary to problem #1. Follow.  #6 leukocytosis Likely reactive leukocytosis secondary to recent surgery. Urinalysis is nitrite negative trace leukocytes 3-6 WBCs. Urine cultures are pending. Chest x-ray is negative for any acute infiltrate. Patient is currently afebrile. No need for antibiotics at this time. Follow.  #7 postop anemia Patient with no overt GI bleed. Check an anemia panel. Follow H&H. Transfusion threshold hemoglobin less than 7.  # 8. S/P Recent THR Will inform orthopedics of admission  #9 prophylaxis PPI for GI prophylaxis. Heparin for DVT prophylaxis.    Code Status: Full DVT Prophylaxis:Heparin Family Communication: Updated patient and wife at bedside Disposition Plan: Admit to telemetry  Time spent: 81 mins  [facility] MD [facility] Pager [phone]  **Disclaimer: This note may have been dictated with voice recognition software. Similar sounding words can inadvertently be transcribed and this note may contain transcription errors which may not have been corrected upon publication of note.**

## 2014-05-27 NOTE — ED Provider Notes (Signed)
CSN: [id number]     Arrival date & time 05/27/14  1215 History   First MD Initiated Contact with Patient 05/27/14 1231     Chief Complaint  Patient presents with  . Post-op Problem     HPI Pt was seen at 1240. Per pt and his wife, c/o gradual onset and worsening of persistent multiple symptoms since he was discharged from the hospital s/p right THR on 05/24/2014. Pt's symptoms include: progressive weight gain, bilateral pedal edema, generalized weakness/fatigue, intermittent episodes of "confusion" and "forgetfulness." Pt's wife also states pt has "not been eating or drinking" for the past 3 days. Pt's wife states pt was initially was having "difficulty urinating" his last hospital day and 1st day home, then pt's Orthopedist rx flomax with improvement in pt's urination. Pt's wife and pt state he has been "urinating so much he can't get to the bathroom soon enough." Pt's wife states she has only been giving pt his percocet at bedtime and before physical therapy. Denies falls, no syncope, no focal motor weakness, no tingling/numbness in extremities, no dysuria/hematuria, no fevers, no rash, no abd pain, no N/V/D, no CP/SOB.    Past Medical History  Diagnosis Date  . Hypertension   . Arthritis     BIL KNEES  . Eczema   . History of kidney stones   . Adrenal gland cyst   . Herpesviral conjunctivitis     hx of in past causing need for corneal transplant rt eye   Past Surgical History  Procedure Laterality Date  . Pilonidal cyst excision  1974  . Corneal transplant  1986  . Hernia repair  1981 /2001 /2012    L ING HERNIA REPAIR X 6  . Total hip arthroplasty Right 05/22/2014    Procedure: RIGHT TOTAL HIP ARTHROPLASTY;  Surgeon: Tobi Bastos, MD;  Location: [facility];  Service: Orthopedics;  Laterality: Right;    History  Substance Use Topics  . Smoking status: Former Smoker    Quit date: 05/17/2011  . Smokeless tobacco: Not on file  . Alcohol Use: Yes     Comment: OCCASIONAL     Review of Systems ROS: Statement: All systems negative except as marked or noted in the HPI; Constitutional: Negative for fever and chills. +generalized weakness/fatigue.; ; Eyes: Negative for eye pain, redness and discharge. ; ; ENMT: Negative for ear pain, hoarseness, nasal congestion, sinus pressure and sore throat. ; ; Cardiovascular: Negative for chest pain, palpitations, diaphoresis, dyspnea and +peripheral edema. ; ; Respiratory: Negative for cough, wheezing and stridor. ; ; Gastrointestinal: Negative for nausea, vomiting, diarrhea, abdominal pain, blood in stool, hematemesis, jaundice and rectal bleeding. . ; ; Genitourinary: Negative for dysuria, flank pain and hematuria. ; ; Genital:  No penile drainage or rash, no testicular pain or swelling, no scrotal rash or swelling.;; Musculoskeletal: Negative for back pain and neck pain. Negative for swelling and trauma.; ; Skin: Negative for pruritus, rash, abrasions, blisters, bruising and skin lesion.; ; Neuro: +intermittent confusion. Negative for headache, lightheadedness and neck stiffness. Negative for altered level of consciousness, extremity weakness, paresthesias, involuntary movement, seizure and syncope.      Allergies  Prednisone and Penicillins  Home Medications   Prior to Admission medications   Medication Sig Start Date End Date Taking? Authorizing Provider  ammonium lactate (LAC-HYDRIN) 12 % lotion Apply 1 application topically 3 (three) times daily as needed for dry skin.   Yes Historical Provider, MD  beta carotene w/minerals (OCUVITE) tablet Take 1 tablet by  mouth daily.   Yes Historical Provider, MD  docusate sodium 100 MG CAPS Take 100 mg by mouth 2 (two) times daily. 05/23/14  Yes Amber Renelda Loma, PA-C  ferrous sulfate 325 (65 FE) MG tablet Take 1 tablet (325 mg total) by mouth 3 (three) times daily after meals. 05/23/14  Yes Amber Renelda Loma, PA-C  furosemide (LASIX) 20 MG tablet Take 20 mg by mouth 2 (two)  times daily as needed for fluid.    Yes Historical Provider, MD  glucosamine-chondroitin 500-400 MG tablet Take 1 tablet by mouth 2 (two) times daily.   Yes Historical Provider, MD  indomethacin (INDOCIN) 25 MG capsule Take 50 mg by mouth 2 (two) times daily with a meal.   Yes Historical Provider, MD  losartan (COZAAR) 50 MG tablet Take 50 mg by mouth 2 (two) times daily.   Yes Historical Provider, MD  methocarbamol (ROBAXIN) 500 MG tablet Take 1 tablet (500 mg total) by mouth every 6 (six) hours as needed for muscle spasms. 05/23/14  Yes Amber Renelda Loma, PA-C  oxyCODONE-acetaminophen (PERCOCET/ROXICET) 5-325 MG per tablet Take 1 tablet by mouth every 4 (four) hours as needed for moderate pain or severe pain.   Yes Historical Provider, MD  rivaroxaban (XARELTO) 10 MG TABS tablet Take 1 tablet (10 mg total) by mouth daily with breakfast. 05/23/14  Yes Amber Lauren Constable, PA-C  Saw Palmetto 160 MG CAPS Take 160 mg by mouth daily.   Yes Historical Provider, MD   BP 106/48  Pulse 69  Temp(Src) 97.6 F (36.4 C) (Oral)  Resp 13  SpO2 96% Filed Vitals:   05/27/14 1218 05/27/14 1443  BP: 120/51 106/48  Pulse: 85 69  Temp: 97.8 F (36.6 C) 97.6 F (36.4 C)  TempSrc: Oral Oral  Resp: 16 13  SpO2: 96% 96%    Physical Exam 1245: Physical examination:  Nursing notes reviewed; Vital signs and O2 SAT reviewed;  Constitutional: Well developed, Well nourished, In no acute distress; Head:  Normocephalic, atraumatic; Eyes: EOMI, PERRL, No scleral icterus; ENMT: Mouth and pharynx normal, Mucous membranes dry; Neck: Supple, Full range of motion, No lymphadenopathy; Cardiovascular: Regular rate and rhythm, No gallop; Respiratory: Breath sounds coarse & equal bilaterally, No wheezes.  Speaking full sentences with ease, Normal respiratory effort/excursion; Chest: Nontender, Movement normal; Abdomen: Soft, Nontender, Nondistended, Normal bowel sounds; Genitourinary: No CVA tenderness; Extremities:  Pulses normal, No tenderness, +2-3 pedal edema R>L feet to thighs with chronic stasis changes..; Neuro: AA&Ox3, Major CN grossly intact.  Speech clear. No gross focal motor or sensory deficits in extremities.; Skin: Color pale, Warm, Dry.   ED Course  Procedures     EKG Interpretation   Date/Time:  Tuesday May 27 2014 13:13:55 EDT Ventricular Rate:  73 PR Interval:  151 QRS Duration: 108 QT Interval:  381 QTC Calculation: 420 R Axis:   -5 Text Interpretation:  Sinus rhythm RSR' in V1 or V2, right VCD or RVH  Baseline wander No old tracing to compare Confirmed by [facility]  MD,  Nunzio Cory [phone]) on 05/27/2014 1:31:15 PM      MDM  MDM Reviewed: previous chart, nursing note and vitals Reviewed previous: labs and ECG Interpretation: labs, ECG, x-ray and ultrasound    Results for orders placed during the hospital encounter of 05/27/14  URINALYSIS, ROUTINE W REFLEX MICROSCOPIC      Result Value Ref Range   Color, Urine YELLOW  YELLOW   APPearance CLEAR  CLEAR   Specific Gravity, Urine 1.015  1.005 -  1.030   pH 5.0  5.0 - 8.0   Glucose, UA NEGATIVE  NEGATIVE mg/dL   Hgb urine dipstick LARGE (*) NEGATIVE   Bilirubin Urine NEGATIVE  NEGATIVE   Ketones, ur NEGATIVE  NEGATIVE mg/dL   Protein, ur NEGATIVE  NEGATIVE mg/dL   Urobilinogen, UA 0.2  0.0 - 1.0 mg/dL   Nitrite NEGATIVE  NEGATIVE   Leukocytes, UA TRACE (*) NEGATIVE  CBC WITH DIFFERENTIAL      Result Value Ref Range   WBC 13.7 (*) 4.0 - 10.5 K/uL   RBC 2.63 (*) 4.22 - 5.81 MIL/uL   Hemoglobin 8.2 (*) 13.0 - 17.0 g/dL   HCT 23.3 (*) 39.0 - 52.0 %   MCV 88.6  78.0 - 100.0 fL   MCH 31.2  26.0 - 34.0 pg   MCHC 35.2  30.0 - 36.0 g/dL   RDW 14.4  11.5 - 15.5 %   Platelets 197  150 - 400 K/uL   Neutrophils Relative % 67  43 - 77 %   Neutro Abs 9.2 (*) 1.7 - 7.7 K/uL   Lymphocytes Relative 21  12 - 46 %   Lymphs Abs 2.8  0.7 - 4.0 K/uL   Monocytes Relative 12  3 - 12 %   Monocytes Absolute 1.7 (*) 0.1 - 1.0 K/uL    Eosinophils Relative 0  0 - 5 %   Eosinophils Absolute 0.0  0.0 - 0.7 K/uL   Basophils Relative 0  0 - 1 %   Basophils Absolute 0.0  0.0 - 0.1 K/uL  PRO B NATRIURETIC PEPTIDE      Result Value Ref Range   Pro B Natriuretic peptide (BNP) 115.2  0 - 125 pg/mL  TROPONIN I      Result Value Ref Range   Troponin I <0.30  <0.30 ng/mL  BASIC METABOLIC PANEL      Result Value Ref Range   Sodium 133 (*) 137 - 147 mEq/L   Potassium 4.4  3.7 - 5.3 mEq/L   Chloride 97  96 - 112 mEq/L   CO2 17 (*) 19 - 32 mEq/L   Glucose, Bld 113 (*) 70 - 99 mg/dL   BUN 85 (*) 6 - 23 mg/dL   Creatinine, Ser 3.78 (*) 0.50 - 1.35 mg/dL   Calcium 8.7  8.4 - 10.5 mg/dL   GFR calc non Af Amer 15 (*) >90 mL/min   GFR calc Af Amer 17 (*) >90 mL/min   Anion gap 19 (*) 5 - 15  URINE MICROSCOPIC-ADD ON      Result Value Ref Range   Squamous Epithelial / LPF RARE  RARE   WBC, UA 3-6  <3 WBC/hpf   RBC / HPF 11-20  <3 RBC/hpf  SAMPLE TO BLOOD BANK      Result Value Ref Range   Blood Bank Specimen SAMPLE AVAILABLE FOR TESTING     Sample Expiration 05/30/2014     Dg Chest 2 View 05/27/2014   CLINICAL DATA:  Shortness of breath for 6 days following right hip replacement  EXAM: CHEST  2 VIEW  COMPARISON:  05/16/2014  FINDINGS: Mild cardiac enlargement stable. Vascular pattern normal. Lungs clear. No effusion or pneumothorax.  IMPRESSION: No active cardiopulmonary disease.   Electronically Signed   By: Skipper Cliche M.D.   On: 05/27/2014 13:42   Results for Bonaparte, YOUNES (MRN [id number]) as of 05/27/2014 15:02  Ref. Range 05/16/2014 14:30 05/23/2014 05:33 05/24/2014 05:19 05/27/2014 13:07  BUN Latest Range: 6-23 mg/dL  23 16 19  85 (H)  Creatinine Latest Range: 0.50-1.35 mg/dL 0.94 0.92 0.88 3.78 (H)   Results for Ryland, BLAIRE (MRN [id number]) as of 05/27/2014 15:02  Ref. Range 05/16/2014 14:30 05/23/2014 05:33 05/24/2014 05:19 05/27/2014 13:07  Hemoglobin Latest Range: 13.0-17.0 g/dL 12.2 (L) 9.6 (L) 9.4 (L) 8.2 (L)  CT Latest  Range: 39.0-52.0 % 36.6 (L) 28.7 (L) 28.1 (L) 23.3 (L)    1435:   H/H mildly decreased from his hospital discharge. BUN/Cr increased from previous; will dose judicious IVF. Vasc US RLE without DVT.  UA without UTI. No urinary retention with pt's post void residual = 54ml. Dx and testing d/w pt and family.  Questions answered.  Verb understanding, agreeable to admit. T/C to [facility] Grandville Silos, case discussed, including:  HPI, pertinent PM/SHx, VS/PE, dx testing, ED course and treatment:  Agreeable to admit, requests to write temporary orders, obtain tele bed to team 8.      Alfonzo Feller, DO 05/29/14 1630

## 2014-05-27 NOTE — Progress Notes (Signed)
  CARE MANAGEMENT ED NOTE 05/27/2014  Patient:  [name],[name]   Account Number:  [id number]  Date Initiated:  05/27/2014  Documentation initiated by:  Livia Snellen  Subjective/Objective Assessment:   Patient presents to Ed with decreased po intake, swelling to right hip to foot, incontinent of bowel and bladder     Subjective/Objective Assessment Detail:   Patient is post RIGHT TOTAL HIP ARTHROPLASTY (Right) BUN 85 creat 3.78     Action/Plan:   Action/Plan Detail:   Anticipated DC Date:       Status Recommendation to Physician:   Result of Recommendation:    Other ED Services  Consult Working [place]  Other    Choice offered to / List presented to:           [place]    Status of service:  Completed, signed off  ED Comments:   ED Comments Detail:  EDCM spoke to patient and his wife at bedside.  Patient lives at home with his wife.  Patient confirms he has home health with [place] for PT.  Patient's wife assists him with his ADL's at home.  Patient has a walker, cane, elevated toilet seat, grab bars in the shower, shower chair and a recliner lift chair at home.  Patient's wife reports they are unable to use their bedside commode at home beacuse it is too small for the patient, even the elevated toilet seat is too small for patient to fit comfortably. Patient confirms his pcp is Dr. Hulan Fess.  Patient reports he did not receive a post discharge phone call. Patient reports he has seen Dr. Jacelyn Grip yesterdat 07/20 who was filling in for Dr. Rex Kras as he is on vacation.  This information was placed in readmission focus note. Patient's wife reports they would rather the patient go home on discharge but if he needs to go to  a rehab facility, they would prefer [facility] center [address] [phone] contact person is Isac Caddy.  Patient and patient's wife are pleased with [facility] for home health services. EDCM placed CM and SW  consult.  No furhter EDCM needs at this time.

## 2014-05-27 NOTE — ED Notes (Signed)
Pt sent from PCP due to wife stating pt has right hip replacement last week, since then pt has been having swelling from right hip to foot, extremly weak and refuses to eat, pt also cannot stay awake but pt only takes percocet at bedtime and before PT. Incontinent of stool and urine so pt has to wear diaper. States pt is forgetful of events and has confusion.

## 2014-05-27 NOTE — ED Notes (Signed)
Attempted to call report.  RN not available.  To return call.

## 2014-05-28 ENCOUNTER — Inpatient Hospital Stay (HOSPITAL_COMMUNITY): Payer: Medicare Other

## 2014-05-28 DIAGNOSIS — N179 Acute kidney failure, unspecified: Secondary | ICD-10-CM | POA: Insufficient documentation

## 2014-05-28 DIAGNOSIS — Z820 Family history of epilepsy and other diseases of the nervous system: Secondary | ICD-10-CM | POA: Diagnosis not present

## 2014-05-28 DIAGNOSIS — N289 Disorder of kidney and ureter, unspecified: Secondary | ICD-10-CM | POA: Diagnosis not present

## 2014-05-28 DIAGNOSIS — Z79899 Other long term (current) drug therapy: Secondary | ICD-10-CM | POA: Diagnosis not present

## 2014-05-28 DIAGNOSIS — D72829 Elevated white blood cell count, unspecified: Secondary | ICD-10-CM | POA: Diagnosis not present

## 2014-05-28 DIAGNOSIS — I10 Essential (primary) hypertension: Secondary | ICD-10-CM

## 2014-05-28 DIAGNOSIS — R609 Edema, unspecified: Secondary | ICD-10-CM | POA: Diagnosis not present

## 2014-05-28 DIAGNOSIS — R32 Unspecified urinary incontinence: Secondary | ICD-10-CM | POA: Diagnosis present

## 2014-05-28 DIAGNOSIS — E872 Acidosis, unspecified: Secondary | ICD-10-CM | POA: Diagnosis present

## 2014-05-28 DIAGNOSIS — N189 Chronic kidney disease, unspecified: Secondary | ICD-10-CM | POA: Diagnosis present

## 2014-05-28 DIAGNOSIS — Z96649 Presence of unspecified artificial hip joint: Secondary | ICD-10-CM | POA: Diagnosis not present

## 2014-05-28 DIAGNOSIS — I5031 Acute diastolic (congestive) heart failure: Secondary | ICD-10-CM | POA: Diagnosis not present

## 2014-05-28 DIAGNOSIS — I517 Cardiomegaly: Secondary | ICD-10-CM | POA: Diagnosis not present

## 2014-05-28 DIAGNOSIS — D649 Anemia, unspecified: Secondary | ICD-10-CM

## 2014-05-28 DIAGNOSIS — I1310 Hypertensive heart and chronic kidney disease without heart failure, with stage 1 through stage 4 chronic kidney disease, or unspecified chronic kidney disease: Secondary | ICD-10-CM | POA: Diagnosis not present

## 2014-05-28 DIAGNOSIS — D62 Acute posthemorrhagic anemia: Secondary | ICD-10-CM | POA: Diagnosis present

## 2014-05-28 DIAGNOSIS — R635 Abnormal weight gain: Secondary | ICD-10-CM | POA: Diagnosis present

## 2014-05-28 DIAGNOSIS — Z87891 Personal history of nicotine dependence: Secondary | ICD-10-CM | POA: Diagnosis not present

## 2014-05-28 LAB — TROPONIN I
Troponin I: <0.3 ng/mL (ref <0.30)
Troponin I: <0.3 ng/mL (ref <0.30)

## 2014-05-28 LAB — COMPREHENSIVE METABOLIC PANEL
ALT: 53 U/L (ref 0–53)
AST: 36 U/L (ref 0–37)
Albumin: 2.7 g/dL — ABNORMAL LOW (ref 3.5–5.2)
Alkaline Phosphatase: 86 U/L (ref 39–117)
Anion gap: 12 (ref 5–15)
BUN: 66 mg/dL — ABNORMAL HIGH (ref 6–23)
CO2: 25 mEq/L (ref 19–32)
Calcium: 8.8 mg/dL (ref 8.4–10.5)
Chloride: 101 mEq/L (ref 96–112)
Creatinine, Ser: 1.83 mg/dL — ABNORMAL HIGH (ref 0.50–1.35)
GFR calc Af Amer: 42 mL/min — ABNORMAL LOW (ref >90)
GFR calc non Af Amer: 36 mL/min — ABNORMAL LOW (ref >90)
Glucose, Bld: 105 mg/dL — ABNORMAL HIGH (ref 70–99)
Potassium: 4.3 mEq/L (ref 3.7–5.3)
Sodium: 138 mEq/L (ref 137–147)
Total Bilirubin: 1.1 mg/dL (ref 0.3–1.2)
Total Protein: 6 g/dL (ref 6.0–8.3)

## 2014-05-28 LAB — URINE CULTURE
Colony Count: NO GROWTH
Culture: NO GROWTH

## 2014-05-28 LAB — CBC
HCT: 26.1 % — ABNORMAL LOW (ref 39.0–52.0)
Hemoglobin: 8.9 g/dL — ABNORMAL LOW (ref 13.0–17.0)
MCH: 30.8 pg (ref 26.0–34.0)
MCHC: 34.1 g/dL (ref 30.0–36.0)
MCV: 90.3 fL (ref 78.0–100.0)
Platelets: 224 10*3/uL (ref 150–400)
RBC: 2.89 MIL/uL — ABNORMAL LOW (ref 4.22–5.81)
RDW: 14.5 % (ref 11.5–15.5)
WBC: 10.7 10*3/uL — ABNORMAL HIGH (ref 4.0–10.5)

## 2014-05-28 LAB — HAPTOGLOBIN: Haptoglobin: 407 mg/dL — ABNORMAL HIGH (ref 45–215)

## 2014-05-28 MED ORDER — FUROSEMIDE 10 MG/ML IJ SOLN
8.0000 mg/h | INTRAVENOUS | Status: AC
  Administered 2014-05-28: 4 mg/h via INTRAVENOUS
  Filled 2014-05-28 (×2): qty 25

## 2014-05-28 MED ORDER — SODIUM CHLORIDE 0.9 % IJ SOLN
10.0000 mL | INTRAMUSCULAR | Status: DC | PRN
  Administered 2014-05-30 – 2014-05-31 (×3): 10 mL

## 2014-05-28 NOTE — Evaluation (Signed)
Occupational Therapy Evaluation Patient Details Name: [name] [name] MRN: [id number] DOB: 15-Apr-1946 Today's Date: 05/28/2014    History of Present Illness Pt with history of arthritis status post right total hip arthroplasty 05/22/2014, hypertension who presents to the ED with shortness of breath on exertion, worsening bilateral lower extremity edema x3 days, generalized weakness, decreased appetite and decreased urine output.   Clinical Impression    Pt presents to OT with decreased I with ADL activity s/p re admit to hospital . Pt will benefit from skilled OT to increase I with ADL activity prior to return home with [facility] therapy.   Follow Up Recommendations  Home health OT    Equipment Recommendations  None recommended by OT       Precautions / Restrictions Restrictions Weight Bearing Restrictions: Yes RLE Weight Bearing: Weight bearing as tolerated      Mobility Bed Mobility Overal bed mobility: Needs Assistance Bed Mobility: Supine to Sit     Supine to sit: Min assist        Transfers Overall transfer level: Needs assistance Equipment used: Rolling walker (2 wheeled)   Sit to Stand: Min assist         General transfer comment: cues for UEs         ADL Overall ADL's : Needs assistance/impaired     Grooming: Set up;Sitting   Upper Body Bathing: Set up;Sitting   Lower Body Bathing: Moderate assistance;Sit to/from stand   Upper Body Dressing : Set up;Sitting   Lower Body Dressing: Moderate assistance;With adaptive equipment;Sit to/from stand   Toilet Transfer: Min guard;RW   Toileting- Water quality scientist and Hygiene: Min guard;Sit to/from stand         General ADL Comments: Pt agreed to [place].  Pt reported he did have therapy one time at home with [facility] therapist.  Pt has all equipment               Hand Dominance Right   Extremity/Trunk Assessment Upper Extremity Assessment Upper Extremity Assessment: Generalized weakness            Communication Communication Communication: No difficulties   Cognition Arousal/Alertness: Awake/alert Behavior During Therapy: WFL for tasks assessed/performed Overall Cognitive Status: Within Functional Limits for tasks assessed                     General Comments   Pt has good support of wife and has all needed equipment            Home Living Family/patient expects to be discharged to:: Private residence Living Arrangements: Spouse/significant other Available Help at Discharge: Family Type of Home: House Home Access: Stairs to enter Technical brewer of Steps: 1+1 Entrance Stairs-Rails: None Home Layout: One level     Bathroom Shower/Tub: Occupational psychologist: [place]: Environmental consultant - 2 wheels;Toilet riser;Shower seat   Additional Comments: AE kit including dressing stick.  Wife went to medical supply and bought everything.  Got high toilet rise with rails--she will measure this      Prior Functioning/Environment Level of Independence: Independent;Independent with assistive device(s)        Comments: Used cane - wife states he was unsteady at times    OT Diagnosis: Generalized weakness   OT Problem List: Decreased strength;Decreased activity tolerance;Decreased knowledge of use of DME or AE;Decreased knowledge of precautions;Pain   OT Treatment/Interventions: Self-care/ADL training;DME and/or AE instruction;Patient/family education    OT Goals(Current goals can be found in  the care plan section) Acute Rehab OT Goals Patient Stated Goal: get back home with home therapy OT Goal Formulation: With patient Time For Goal Achievement: 06/04/14 Potential to Achieve Goals: Good  OT Frequency: Min 2X/week              End of Session    Activity Tolerance: Patient tolerated treatment well Patient left: in chair;with call bell/phone within reach;with family/visitor present   Time: 2763-9432 OT Time Calculation (min):  24 min Charges:  OT General Charges $OT Visit: 1 Procedure OT Evaluation $Initial OT Evaluation Tier I: 1 Procedure OT Treatments $Self Care/Home Management : 8-22 mins G-Codes:    Payton Mccallum D 06-14-2014, 10:12 AM

## 2014-05-28 NOTE — Progress Notes (Signed)
  Echocardiogram 2D Echocardiogram has been performed.  [name] [name] 05/28/2014, 3:08 PM

## 2014-05-28 NOTE — Progress Notes (Signed)
CSW received referral for potential SNF.   CSW awaiting PT/OT evaluation in order to assist as appropriate.  CSW to complete full psychosocial assessment once pt discharge needs are clarified if needed.  Alison Murray, MSW, LCSW Clinical Social Work Coverage for [place], [place] [phone]

## 2014-05-28 NOTE — Progress Notes (Signed)
Peripherally Inserted Central Catheter/Midline Placement  The IV Nurse has discussed with the patient and/or persons authorized to consent for the patient, the purpose of this procedure and the potential benefits and risks involved with this procedure.  The benefits include less needle sticks, lab draws from the catheter and patient may be discharged home with the catheter.  Risks include, but not limited to, infection, bleeding, blood clot (thrombus formation), and puncture of an artery; nerve damage and irregular heat beat.  Alternatives to this procedure were also discussed.  PICC/Midline Placement Documentation        Darlyn Read 05/28/2014, 4:10 PM

## 2014-05-28 NOTE — Evaluation (Signed)
Physical Therapy Evaluation Patient Details Name: [name] [name] MRN: [id number] DOB: May 01, 1946 Today's Date: 05/28/2014   History of Present Illness  Pt with history of arthritis status post right total hip arthroplasty 05/22/2014, hypertension who presents to the ED with shortness of breath on exertion, worsening bilateral lower extremity edema x3 days, generalized weakness, decreased appetite and decreased urine output.  Clinical Impression  Pt currently functioning at min assist level for basic mobility with functional mobility limitations 2* decreased strength/ROM R LE and posterior THP.  Pt should progress well to d/c home with family assist and HHPT follow up.    Follow Up Recommendations Home health PT    Equipment Recommendations  None recommended by PT    Recommendations for Other Services OT consult     Precautions / Restrictions Precautions Precautions: Posterior Hip Precaution Comments: Pt recalls all THP without cues Restrictions Weight Bearing Restrictions: Yes RLE Weight Bearing: Weight bearing as tolerated RLE Partial Weight Bearing Percentage or Pounds: 50%      Mobility  Bed Mobility Overal bed mobility: Needs Assistance Bed Mobility: Supine to Sit     Supine to sit: Min assist     General bed mobility comments: min assist and VC for R LE management and adherence to THP  Transfers Overall transfer level: Needs assistance Equipment used: Rolling walker (2 wheeled) Transfers: Sit to/from Stand Sit to Stand: Min assist         General transfer comment: cues for UEs  Ambulation/Gait Ambulation/Gait assistance: Min assist;Min guard Ambulation Distance (Feet): 133 Feet Assistive device: Rolling walker (2 wheeled) Gait Pattern/deviations: Step-to pattern;Step-through pattern;Decreased step length - right;Decreased step length - left;Shuffle;Trunk flexed     General Gait Details: cues for posture, position from RW, PWB on R and sequence  Stairs             Wheelchair Mobility    Modified Rankin (Stroke Patients Only)       Balance                                             Pertinent Vitals/Pain 3/10; premed, ice pack provided    Home Living Family/patient expects to be discharged to:: Private residence Living Arrangements: Spouse/significant other Available Help at Discharge: Family Type of Home: House Home Access: Stairs to enter Entrance Stairs-Rails: None Entrance Stairs-Number of Steps: 1+1 Home Layout: One level Home Equipment: Environmental consultant - 2 wheels;Toilet riser;Shower seat Additional Comments: AE kit including dressing stick.  Wife went to medical supply and bought everything.  Got high toilet rise with rails--she will measure this    Prior Function Level of Independence: Independent;Independent with assistive device(s)         Comments: Used cane - wife states he was unsteady at times     Hand Dominance   Dominant Hand: Right    Extremity/Trunk Assessment   Upper Extremity Assessment: Overall WFL for tasks assessed           Lower Extremity Assessment: RLE deficits/detail RLE Deficits / Details: 2/5 hip strength with AAROM at hip to 80 flex and 15 abd    Cervical / Trunk Assessment: Normal  Communication   Communication: No difficulties  Cognition Arousal/Alertness: Awake/alert Behavior During Therapy: WFL for tasks assessed/performed Overall Cognitive Status: Within Functional Limits for tasks assessed  General Comments      Exercises Total Joint Exercises Ankle Circles/Pumps: AROM;Both;15 reps;Supine Quad Sets: AROM;Both;10 reps;Supine Gluteal Sets: AROM;Both;10 reps;Supine Heel Slides: AAROM;Right;Supine;20 reps Hip ABduction/ADduction: AAROM;Right;Supine;20 reps      Assessment/Plan    PT Assessment Patient needs continued PT services  PT Diagnosis Difficulty walking   PT Problem List Decreased strength;Decreased range of  motion;Decreased activity tolerance;Decreased mobility;Decreased knowledge of use of DME;Obesity;Pain;Decreased knowledge of precautions  PT Treatment Interventions DME instruction;Gait training;Stair training;Functional mobility training;Therapeutic activities;Therapeutic exercise;Patient/family education   PT Goals (Current goals can be found in the Care Plan section) Acute Rehab PT Goals Patient Stated Goal: get back home with home therapy PT Goal Formulation: With patient Time For Goal Achievement: 05/29/14 Potential to Achieve Goals: Good    Frequency 7X/week   Barriers to discharge        Co-evaluation               End of Session Equipment Utilized During Treatment: Gait belt Activity Tolerance: Patient tolerated treatment well Patient left: in chair;with call bell/phone within reach;with family/visitor present Nurse Communication: Mobility status         Time: 1201-1234 PT Time Calculation (min): 33 min   Charges:   PT Evaluation $Initial PT Evaluation Tier I: 1 Procedure PT Treatments $Gait Training: 8-22 mins $Therapeutic Exercise: 8-22 mins   PT G Codes:          Christifer Chapdelaine 05/28/2014, 3:30 PM

## 2014-05-28 NOTE — Progress Notes (Signed)
[facility] PROGRESS NOTE  Assessment/Plan: Acute renal failure - Pre-renal azotemia, due to ARB-II and NSAID's, improved with lasix IV. Low yield for HUS, haptoglobin high renal failure improved with diuretics, PLt's > 200 - Cr improving with lasix. D/c Flomax and Bicarbonate tablets. - Change lasix to infusion, + 3 edema, + JVD, crackles at bases bilaterally. - start lasix infusion. Pt requested a PICC line.  Metabolic Acidosis: - due to renal failure, improve with diuresis. - d/c HCO.  HTN (hypertension) - Stable. - Cont to hold ARB-II.  Leukocytosis - ? Due to AKI. - ? Vq scan as below. Cardiac markers negative, echo pending.  Postoperative anemia due to acute blood loss: - anemia panel unreliable, ferritin will behave as acute phase reactant.    Code Status: full Family Communication: wife  Disposition Plan: inpatinet   Consultants:  none  Procedures:  V/Q scan very low probability for PE.  Echo pending  Renal US pending  Antibiotics:  None  HPI/Subjective: Relates feels weak.  Objective: Filed Vitals:   05/27/14 1734 05/27/14 1900 05/27/14 2137 05/28/14 0517  BP: 149/51  106/54 110/66  Pulse: 82   79  Temp: 98.3 F (36.8 C)  98.2 F (36.8 C) 98.7 F (37.1 C)  TempSrc: Oral  Oral Oral  Resp: 16  18 20   Height:  6' (1.829 m)    Weight:    120.9 kg (266 lb 8.6 oz)  SpO2: 100%  98% 99%    Intake/Output Summary (Last 24 hours) at 05/28/14 0841 Last data filed at 05/28/14 0741  Gross per 24 hour  Intake      0 ml  Output   6973 ml  Net  -6973 ml   Filed Weights   05/28/14 0517  Weight: 120.9 kg (266 lb 8.6 oz)    Exam:  General: Alert, awake, oriented x3, in no acute distress.  HEENT: No bruits, no goiter. +JVD Heart: Regular rate and rhythm, no gallops. Lungs: Good air movement, crackles at bases Abdomen: Soft, nontender, nondistended, positive bowel sounds.     Data Reviewed: Basic Metabolic Panel:  Recent  Labs Lab 05/23/14 0533 05/24/14 0519 05/27/14 1307 05/27/14 1636 05/27/14 1840 05/28/14 0528  NA 139 138 133*  --   --  138  K 4.7 4.6 4.4  --   --  4.3  CL 103 103 97  --   --  101  CO2 28 26 17*  --   --  25  GLUCOSE 125* 129* 113*  --   --  105*  BUN 16 19 85*  --   --  66*  CREATININE 0.92 0.88 3.78*  --   --  1.83*  CALCIUM 8.7 9.2 8.7  --   --  8.8  MG  --   --   --  2.5 2.6*  --    Liver Function Tests:  Recent Labs Lab 05/27/14 1636 05/28/14 0528  AST 35 36  ALT 38 53  ALKPHOS 78 86  BILITOT 1.2 1.1  PROT 6.0 6.0  ALBUMIN 2.8* 2.7*   No results found for this basename: LIPASE, AMYLASE,  in the last 168 hours No results found for this basename: AMMONIA,  in the last 168 hours CBC:  Recent Labs Lab 05/23/14 0533 05/24/14 0519 05/27/14 1307 05/28/14 0528  WBC 10.7* 11.7* 13.7* 10.7*  NEUTROABS  --   --  9.2*  --   HGB 9.6* 9.4* 8.2* 8.9*  HCT 28.7* 28.1* 23.3* 26.1*  MCV 92.6 91.5 88.6 90.3  PLT 134* 143* 197 224   Cardiac Enzymes:  Recent Labs Lab 05/27/14 1307 05/27/14 1840 05/28/14 0004 05/28/14 0528  TROPONINI <0.30 <0.30 <0.30 <0.30   BNP (last 3 results)  Recent Labs  05/27/14 1307  PROBNP 115.2   CBG: No results found for this basename: GLUCAP,  in the last 168 hours  No results found for this or any previous visit (from the past 240 hour(s)).   Studies: Dg Chest 2 View  05/27/2014   CLINICAL DATA:  Shortness of breath for 6 days following right hip replacement  EXAM: CHEST  2 VIEW  COMPARISON:  05/16/2014  FINDINGS: Mild cardiac enlargement stable. Vascular pattern normal. Lungs clear. No effusion or pneumothorax.  IMPRESSION: No active cardiopulmonary disease.   Electronically Signed   By: Skipper Cliche M.D.   On: 05/27/2014 13:42   Nm Pulmonary Perf And Vent  05/27/2014   CLINICAL DATA:  Shortness of breath for 6 days following total hip arthroplasty. Elevated D-dimer levels and renal failure. Question pulmonary embolism.   EXAM: NUCLEAR MEDICINE VENTILATION - PERFUSION LUNG SCAN  TECHNIQUE: Ventilation images were obtained in multiple projections using inhaled aerosol technetium 99 M DTPA. Perfusion images were obtained in multiple projections after intravenous injection of Tc-83m MAA.  RADIOPHARMACEUTICALS:  36.2 mCi Tc-79m DTPA aerosol and 5.3 mCi Tc-68m MAA  COMPARISON:  Chest radiographs 05/27/2014.  FINDINGS: Ventilation: No focal ventilation defect identified. There is clumping of the aerosol within central airways.  Perfusion: No wedge shaped peripheral perfusion defects to suggest acute pulmonary embolism.  IMPRESSION: Very low probability for acute pulmonary embolism.   Electronically Signed   By: Camie Patience M.D.   On: 05/27/2014 19:40    Scheduled Meds: . sodium chloride   Intravenous STAT  . beta carotene w/minerals  1 tablet Oral Daily  . docusate sodium  100 mg Oral BID  . ferrous sulfate  325 mg Oral TID PC  . furosemide  40 mg Intravenous Q12H  . heparin subcutaneous  5,000 Units Subcutaneous 3 times per day  . pantoprazole  40 mg Oral Q0600  . sodium bicarbonate  650 mg Oral BID  . sodium chloride  3 mL Intravenous Q12H  . sodium chloride  3 mL Intravenous Q12H   Continuous Infusions:    Charlynne Cousins  [facility] Pager [phone]. If 8PM-8AM, please contact night-coverage at [email or web address], password [email or web address] 05/28/2014, 8:41 AM  LOS: 1 day      **Disclaimer: This note may have been dictated with voice recognition software. Similar sounding words can inadvertently be transcribed and this note may contain transcription errors which may not have been corrected upon publication of note.**

## 2014-05-28 NOTE — Progress Notes (Signed)
Subjective:     Patient reports pain as 1 on 0-10 scale.Hip is doing well. Admitted by his medical Dr. For cardiac evaluation.  Objective: Vital signs in last 24 hours: Temp:  [97.6 F (36.4 C)-98.8 F (37.1 C)] 98.7 F (37.1 C) (07/22 0517) Pulse Rate:  [69-85] 79 (07/22 0517) Resp:  [13-20] 20 (07/22 0517) BP: (94-149)/(46-66) 110/66 mmHg (07/22 0517) SpO2:  [96 %-100 %] 99 % (07/22 0517) Weight:  [120.9 kg (266 lb 8.6 oz)] 120.9 kg (266 lb 8.6 oz) (07/22 0517)  Intake/Output from previous day: 07/21 0701 - 07/22 0700 In: -  Out: 5373 [QKMMN:8177] Intake/Output this shift: Total I/O In: -  Out: 2350 [Urine:2350]   Recent Labs  05/27/14 1307 05/28/14 0528  HGB 8.2* 8.9*    Recent Labs  05/27/14 1307 05/28/14 0528  WBC 13.7* 10.7*  RBC 2.63* 2.89*  HCT 23.3* 26.1*  PLT 197 224    Recent Labs  05/27/14 1307 05/28/14 0528  NA 133* 138  K 4.4 4.3  CL 97 101  CO2 17* 25  BUN 85* 66*  CREATININE 3.78* 1.83*  GLUCOSE 113* 105*  CALCIUM 8.7 8.8    Recent Labs  05/27/14 1840  INR 1.71*    Dorsiflexion/Plantar flexion intact  Assessment/Plan:     Up with therapy  Tanda Morrissey A 05/28/2014, 11:48 AM

## 2014-05-29 DIAGNOSIS — I5031 Acute diastolic (congestive) heart failure: Secondary | ICD-10-CM

## 2014-05-29 DIAGNOSIS — I1310 Hypertensive heart and chronic kidney disease without heart failure, with stage 1 through stage 4 chronic kidney disease, or unspecified chronic kidney disease: Secondary | ICD-10-CM

## 2014-05-29 LAB — BASIC METABOLIC PANEL
Anion gap: 11 (ref 5–15)
BUN: 37 mg/dL — ABNORMAL HIGH (ref 6–23)
CO2: 27 mEq/L (ref 19–32)
Calcium: 8.4 mg/dL (ref 8.4–10.5)
Chloride: 103 mEq/L (ref 96–112)
Creatinine, Ser: 1.07 mg/dL (ref 0.50–1.35)
GFR calc Af Amer: 80 mL/min — ABNORMAL LOW (ref >90)
GFR calc non Af Amer: 69 mL/min — ABNORMAL LOW (ref >90)
Glucose, Bld: 103 mg/dL — ABNORMAL HIGH (ref 70–99)
Potassium: 3.9 mEq/L (ref 3.7–5.3)
Sodium: 141 mEq/L (ref 137–147)

## 2014-05-29 NOTE — Progress Notes (Signed)
Physical Therapy Treatment Patient Details Name: [name] [name] MRN: [id number] DOB: 1946/03/24 Today's Date: 05/29/2014    History of Present Illness Pt with history of arthritis status post right total hip arthroplasty 05/22/2014, hypertension who presents to the ED with shortness of breath on exertion, worsening bilateral lower extremity edema x3 days, generalized weakness, decreased appetite and decreased urine output.    PT Comments    Pt progressing well and feeling much better than at point of admission  Follow Up Recommendations  Home health PT     Equipment Recommendations  None recommended by PT    Recommendations for Other Services OT consult     Precautions / Restrictions Precautions Precautions: Posterior Hip Precaution Comments: Pt recalls all THP without cues Restrictions Weight Bearing Restrictions: Yes RLE Weight Bearing: Weight bearing as tolerated RLE Partial Weight Bearing Percentage or Pounds: 50%    Mobility  Bed Mobility Overal bed mobility: Needs Assistance Bed Mobility: Supine to Sit     Supine to sit: Min assist     General bed mobility comments: min assist and VC for R LE management and adherence to THP  Transfers Overall transfer level: Needs assistance Equipment used: Rolling walker (2 wheeled) Transfers: Sit to/from Stand Sit to Stand: Min guard         General transfer comment: cues for UEs  Ambulation/Gait Ambulation/Gait assistance: Min guard Ambulation Distance (Feet): 189 Feet Assistive device: Rolling walker (2 wheeled) Gait Pattern/deviations: Step-to pattern;Step-through pattern;Decreased step length - right;Decreased step length - left;Shuffle;Trunk flexed     General Gait Details: cues for posture, position from RW, PWB on R and sequence   Stairs            Wheelchair Mobility    Modified Rankin (Stroke Patients Only)       Balance                                    Cognition  Arousal/Alertness: Awake/alert Behavior During Therapy: WFL for tasks assessed/performed Overall Cognitive Status: Within Functional Limits for tasks assessed                      Exercises Total Joint Exercises Ankle Circles/Pumps: AROM;Both;15 reps;Supine Quad Sets: AROM;Both;10 reps;Supine Gluteal Sets: AROM;Both;10 reps;Supine Heel Slides: AAROM;Right;Supine;20 reps Hip ABduction/ADduction: AAROM;Right;Supine;20 reps Long Arc Quad: AROM;Right;10 reps;Seated    General Comments        Pertinent Vitals/Pain 4/10; MEds requested    Home Living                      Prior Function            PT Goals (current goals can now be found in the care plan section) Acute Rehab PT Goals Patient Stated Goal: get back home with home therapy PT Goal Formulation: With patient Time For Goal Achievement: 05/29/14 Potential to Achieve Goals: Good Progress towards PT goals: Progressing toward goals    Frequency  7X/week    PT Plan Current plan remains appropriate    Co-evaluation             End of Session Equipment Utilized During Treatment: Gait belt Activity Tolerance: Patient tolerated treatment well Patient left: in chair;with call bell/phone within reach;with family/visitor present     Time: 1150-1224 PT Time Calculation (min): 34 min  Charges:  $Gait Training: 8-22 mins $Therapeutic Exercise: 8-22 mins  G Codes:      Brendaliz Kuk 06-08-14, 1:00 PM

## 2014-05-29 NOTE — Progress Notes (Signed)
[facility] PROGRESS NOTE  Assessment/Plan: Cardiorenal syndrome. Acute diastolic heart faolure/Acute renal failure - Cont lasix drip. Monitor strict I and O's - Cr at baseline, daily weights. On admission 120.9->116.3 kg - start lasix infusion. Inserted PICC line 7.22.2015.  Metabolic Acidosis: - due to renal failure, improve with diuresis. - d/c HCO.  HTN (hypertension) - Stable. - Cont to hold ARB-II.  Leukocytosis - ? Due to AKI and post surgical procedure. - ? Vq scan as below. Cardiac markers negative, echo pending.  Postoperative anemia due to acute blood loss: - anemia panel unreliable, ferritin will behave as acute phase reactant.    Code Status: full Family Communication: wife  Disposition Plan: inpatinet   Consultants:  none  Procedures:  V/Q scan very low probability for PE.  Echo pending  Renal US pending  Antibiotics:  None  HPI/Subjective: Feels better  Objective: Filed Vitals:   05/28/14 1700 05/28/14 2230 05/29/14 0611 05/29/14 0648  BP: 121/60 121/57 124/61   Pulse: 50 76 79   Temp: 98.7 F (37.1 C) 98.2 F (36.8 C) 98.1 F (36.7 C)   TempSrc: Oral Oral Oral   Resp: 16 16 18    Height:      Weight:    116.3 kg (256 lb 6.3 oz)  SpO2: 99% 95% 96%     Intake/Output Summary (Last 24 hours) at 05/29/14 1026 Last data filed at 05/29/14 0612  Gross per 24 hour  Intake 790.74 ml  Output   4150 ml  Net -3359.26 ml   Filed Weights   05/28/14 0517 05/29/14 0648  Weight: 120.9 kg (266 lb 8.6 oz) 116.3 kg (256 lb 6.3 oz)    Exam:  General: Alert, awake, oriented x3, in no acute distress.  HEENT: No bruits, no goiter. +JVD Heart: Regular rate and rhythm, no gallops. Lungs: Good air movement, crackles at bases Abdomen: Soft, nontender, nondistended, positive bowel sounds.     Data Reviewed: Basic Metabolic Panel:  Recent Labs Lab 05/23/14 0533 05/24/14 0519 05/27/14 1307 05/27/14 1636 05/27/14 1840  05/28/14 0528 05/29/14 0508  NA 139 138 133*  --   --  138 141  K 4.7 4.6 4.4  --   --  4.3 3.9  CL 103 103 97  --   --  101 103  CO2 28 26 17*  --   --  25 27  GLUCOSE 125* 129* 113*  --   --  105* 103*  BUN 16 19 85*  --   --  66* 37*  CREATININE 0.92 0.88 3.78*  --   --  1.83* 1.07  CALCIUM 8.7 9.2 8.7  --   --  8.8 8.4  MG  --   --   --  2.5 2.6*  --   --    Liver Function Tests:  Recent Labs Lab 05/27/14 1636 05/28/14 0528  AST 35 36  ALT 38 53  ALKPHOS 78 86  BILITOT 1.2 1.1  PROT 6.0 6.0  ALBUMIN 2.8* 2.7*   No results found for this basename: LIPASE, AMYLASE,  in the last 168 hours No results found for this basename: AMMONIA,  in the last 168 hours CBC:  Recent Labs Lab 05/23/14 0533 05/24/14 0519 05/27/14 1307 05/28/14 0528  WBC 10.7* 11.7* 13.7* 10.7*  NEUTROABS  --   --  9.2*  --   HGB 9.6* 9.4* 8.2* 8.9*  HCT 28.7* 28.1* 23.3* 26.1*  MCV 92.6 91.5 88.6 90.3  PLT 134* 143* 197 224  Cardiac Enzymes:  Recent Labs Lab 05/27/14 1307 05/27/14 1840 05/28/14 0004 05/28/14 0528  TROPONINI <0.30 <0.30 <0.30 <0.30   BNP (last 3 results)  Recent Labs  05/27/14 1307  PROBNP 115.2   CBG: No results found for this basename: GLUCAP,  in the last 168 hours  Recent Results (from the past 240 hour(s))  URINE CULTURE     Status: None   Collection Time    05/27/14  1:20 PM      Result Value Ref Range Status   Specimen Description URINE, CLEAN CATCH   Final   Special Requests NONE   Final   Culture  Setup Time     Final   Value: 05/27/2014 19:45     Performed at [facility]     Final   Value: NO GROWTH     Performed at [facility]   Culture     Final   Value: NO GROWTH     Performed at [facility]   Report Status 05/28/2014 FINAL   Final     Studies: Dg Chest 2 View  05/27/2014   CLINICAL DATA:  Shortness of breath for 6 days following right hip replacement  EXAM: CHEST  2 VIEW  COMPARISON:   05/16/2014  FINDINGS: Mild cardiac enlargement stable. Vascular pattern normal. Lungs clear. No effusion or pneumothorax.  IMPRESSION: No active cardiopulmonary disease.   Electronically Signed   By: Skipper Cliche M.D.   On: 05/27/2014 13:42   US Renal  05/28/2014   CLINICAL DATA:  Increased BUN and creatinine, acute renal failure  EXAM: RENAL/URINARY TRACT ULTRASOUND COMPLETE  COMPARISON:  None.  FINDINGS: Right Kidney:  Length: 11.1. Echogenicity within normal limits. No mass or hydronephrosis visualized.  Left Kidney:  Length: 13.3. Echogenicity within normal limits. No mass or hydronephrosis visualized.  Bladder:  Non visualized due to Foley catheter.  IMPRESSION: 1. No hydronephrosis or diagnostic renal calculus.   Electronically Signed   By: Lahoma Crocker M.D.   On: 05/28/2014 11:14   Nm Pulmonary Perf And Vent  05/27/2014   CLINICAL DATA:  Shortness of breath for 6 days following total hip arthroplasty. Elevated D-dimer levels and renal failure. Question pulmonary embolism.  EXAM: NUCLEAR MEDICINE VENTILATION - PERFUSION LUNG SCAN  TECHNIQUE: Ventilation images were obtained in multiple projections using inhaled aerosol technetium 99 M DTPA. Perfusion images were obtained in multiple projections after intravenous injection of Tc-26m MAA.  RADIOPHARMACEUTICALS:  36.2 mCi Tc-46m DTPA aerosol and 5.3 mCi Tc-77m MAA  COMPARISON:  Chest radiographs 05/27/2014.  FINDINGS: Ventilation: No focal ventilation defect identified. There is clumping of the aerosol within central airways.  Perfusion: No wedge shaped peripheral perfusion defects to suggest acute pulmonary embolism.  IMPRESSION: Very low probability for acute pulmonary embolism.   Electronically Signed   By: Camie Patience M.D.   On: 05/27/2014 19:40    Scheduled Meds: . beta carotene w/minerals  1 tablet Oral Daily  . docusate sodium  100 mg Oral BID  . ferrous sulfate  325 mg Oral TID PC  . heparin subcutaneous  5,000 Units Subcutaneous 3 times  per day  . pantoprazole  40 mg Oral Q0600   Continuous Infusions: . furosemide (LASIX) infusion 8 mg/hr (05/29/14 0948)     Charlynne Cousins  [facility] Pager [phone]. If 8PM-8AM, please contact night-coverage at [email or web address], password [facility] 05/29/2014, 10:26 AM  LOS: 2 days      **Disclaimer: This note  may have been dictated with voice recognition software. Similar sounding words can inadvertently be transcribed and this note may contain transcription errors which may not have been corrected upon publication of note.**

## 2014-05-29 NOTE — Progress Notes (Signed)
Clinical Social Work  CSW received inappropriate referral to assist with SNF placement. PT/OT recommend HH at [place]. CM spoke with patient and family who is agreeable to return home with [facility]. CSW is signing off but available if needed.  [place], [place] [phone]

## 2014-05-29 NOTE — Progress Notes (Signed)
Subjective: From Ortho standpoint there are no major issues. His BUN and Creatinine are elevated. Will see as needed.   Objective: Vital signs in last 24 hours: Temp:  [97.9 F (36.6 C)-98.7 F (37.1 C)] 98.1 F (36.7 C) (07/23 0177) Pulse Rate:  [50-80] 79 (07/23 0611) Resp:  [16-18] 18 (07/23 0611) BP: (103-124)/(56-61) 124/61 mmHg (07/23 0611) SpO2:  [95 %-99 %] 96 % (07/23 0611) Weight:  [116.3 kg (256 lb 6.3 oz)] 116.3 kg (256 lb 6.3 oz) (07/23 0648)  Intake/Output from previous day: 07/22 0701 - 07/23 0700 In: 1150.7 [P.O.:1080; I.V.:70.7] Out: 6500 [Urine:6500] Intake/Output this shift:     Recent Labs  05/27/14 1307 05/28/14 0528  HGB 8.2* 8.9*    Recent Labs  05/27/14 1307 05/28/14 0528  WBC 13.7* 10.7*  RBC 2.63* 2.89*  HCT 23.3* 26.1*  PLT 197 224    Recent Labs  05/28/14 0528 05/29/14 0508  NA 138 141  K 4.3 3.9  CL 101 103  CO2 25 27  BUN 66* 37*  CREATININE 1.83* 1.07  GLUCOSE 105* 103*  CALCIUM 8.8 8.4    Recent Labs  05/27/14 1840  INR 1.71*    Dorsiflexion/Plantar flexion intact Compartment soft  Assessment/Plan: Will follow in office.   Roizy Harold A 05/29/2014, 7:07 AM

## 2014-05-30 DIAGNOSIS — I130 Hypertensive heart and chronic kidney disease with heart failure and stage 1 through stage 4 chronic kidney disease, or unspecified chronic kidney disease: Secondary | ICD-10-CM

## 2014-05-30 DIAGNOSIS — N189 Chronic kidney disease, unspecified: Secondary | ICD-10-CM

## 2014-05-30 DIAGNOSIS — I509 Heart failure, unspecified: Secondary | ICD-10-CM

## 2014-05-30 LAB — BASIC METABOLIC PANEL
Anion gap: 11 (ref 5–15)
BUN: 25 mg/dL — ABNORMAL HIGH (ref 6–23)
CO2: 30 mEq/L (ref 19–32)
Calcium: 8.8 mg/dL (ref 8.4–10.5)
Chloride: 100 mEq/L (ref 96–112)
Creatinine, Ser: 0.99 mg/dL (ref 0.50–1.35)
GFR calc Af Amer: >90 mL/min (ref >90)
GFR calc non Af Amer: 82 mL/min — ABNORMAL LOW (ref >90)
Glucose, Bld: 114 mg/dL — ABNORMAL HIGH (ref 70–99)
Potassium: 3.9 mEq/L (ref 3.7–5.3)
Sodium: 141 mEq/L (ref 137–147)

## 2014-05-30 MED ORDER — FUROSEMIDE 40 MG PO TABS
40.0000 mg | ORAL_TABLET | Freq: Two times a day (BID) | ORAL | Status: DC
  Administered 2014-05-30 – 2014-05-31 (×3): 40 mg via ORAL
  Filled 2014-05-30 (×4): qty 1

## 2014-05-30 NOTE — Care Management Note (Unsigned)
    Page 1 of 1   05/30/2014     4:01:43 PM CARE MANAGEMENT NOTE 05/30/2014  Patient:  [name] [name],[name] [name]   Account Number:  [id number]  Date Initiated:  05/30/2014  Documentation initiated by:  [facility]  Subjective/Objective Assessment:   68 year old male admitted with fluid overload.     Action/Plan:   From home with spouse and [facility] providing [facility] services. Recently had hip surgery.   Anticipated DC Date:  06/02/2014   Anticipated DC Plan:  [place]  CM consult      Choice offered to / List presented to:             Status of service:  In process, will continue to follow Medicare Important Message given?   (If response is "NO", the following Medicare IM given date fields will be blank) Date Medicare IM given:   Medicare IM given by:   Date Additional Medicare IM given:   Additional Medicare IM given by:    Discharge Disposition:    Per UR Regulation:  Reviewed for med. necessity/level of care/duration of stay  If discussed at [place] of Stay Meetings, dates discussed:    Comments:

## 2014-05-30 NOTE — Progress Notes (Signed)
[facility] PROGRESS NOTE  Assessment/Plan: Cardiorenal syndrome. Acute diastolic heart faolure/Acute renal failure - started lasix drip, then change to oral tab. Monitor strict I and O's - Cr at baseline, daily weights. On admission 120.9->116.3-> 114.3 kg -  Inserted PICC line 7.22.2015.  Metabolic Acidosis: - due to renal failure, improve with diuresis. - d/c HCO.  HTN (hypertension) - Stable. - Resume  ARB-II on 7.25.2015.  Leukocytosis - ? Due to AKI and post surgical procedure. - Vq scan as below. Cardiac markers negative, echo pending.  Postoperative anemia due to acute blood loss: - anemia panel unreliable, ferritin will behave as acute phase reactant.    Code Status: full Family Communication: wife  Disposition Plan: inpatinet   Consultants:  none  Procedures:  V/Q scan very low probability for PE.  Echo pending  Renal US pending  Antibiotics:  None  HPI/Subjective: No complains wants to go home. Did well with physical therapy.  Objective: Filed Vitals:   05/29/14 1445 05/29/14 2300 05/30/14 0522 05/30/14 0638  BP: 114/54 130/51 114/54   Pulse: 77 79 71   Temp: 98.3 F (36.8 C) 98.2 F (36.8 C) 98.7 F (37.1 C)   TempSrc: Oral Oral Oral   Resp: 16 18 18    Height:      Weight:    114.08 kg (251 lb 8 oz)  SpO2: 96% 98% 96%     Intake/Output Summary (Last 24 hours) at 05/30/14 0705 Last data filed at 05/30/14 0647  Gross per 24 hour  Intake 585.07 ml  Output   3575 ml  Net -2989.93 ml   Filed Weights   05/28/14 0517 05/29/14 0648 05/30/14 1610  Weight: 120.9 kg (266 lb 8.6 oz) 116.3 kg (256 lb 6.3 oz) 114.08 kg (251 lb 8 oz)    Exam:  General: in no acute distress.  HEENT: No bruits, no goiter. - JVD Heart: Regular rate and rhythm, no gallops. Lungs: Good air movement, clear Abdomen: Soft, nontender, nondistended, positive bowel sounds.     Data Reviewed: Basic Metabolic Panel:  Recent Labs Lab 05/24/14 0519  05/27/14 1307 05/27/14 1636 05/27/14 1840 05/28/14 0528 05/29/14 0508 05/30/14 0500  NA 138 133*  --   --  138 141 141  K 4.6 4.4  --   --  4.3 3.9 3.9  CL 103 97  --   --  101 103 100  CO2 26 17*  --   --  25 27 30   GLUCOSE 129* 113*  --   --  105* 103* 114*  BUN 19 85*  --   --  66* 37* 25*  CREATININE 0.88 3.78*  --   --  1.83* 1.07 0.99  CALCIUM 9.2 8.7  --   --  8.8 8.4 8.8  MG  --   --  2.5 2.6*  --   --   --    Liver Function Tests:  Recent Labs Lab 05/27/14 1636 05/28/14 0528  AST 35 36  ALT 38 53  ALKPHOS 78 86  BILITOT 1.2 1.1  PROT 6.0 6.0  ALBUMIN 2.8* 2.7*   No results found for this basename: LIPASE, AMYLASE,  in the last 168 hours No results found for this basename: AMMONIA,  in the last 168 hours CBC:  Recent Labs Lab 05/24/14 0519 05/27/14 1307 05/28/14 0528  WBC 11.7* 13.7* 10.7*  NEUTROABS  --  9.2*  --   HGB 9.4* 8.2* 8.9*  HCT 28.1* 23.3* 26.1*  MCV 91.5 88.6  90.3  PLT 143* 197 224   Cardiac Enzymes:  Recent Labs Lab 05/27/14 1307 05/27/14 1840 05/28/14 0004 05/28/14 0528  TROPONINI <0.30 <0.30 <0.30 <0.30   BNP (last 3 results)  Recent Labs  05/27/14 1307  PROBNP 115.2   CBG: No results found for this basename: GLUCAP,  in the last 168 hours  Recent Results (from the past 240 hour(s))  URINE CULTURE     Status: None   Collection Time    05/27/14  1:20 PM      Result Value Ref Range Status   Specimen Description URINE, CLEAN CATCH   Final   Special Requests NONE   Final   Culture  Setup Time     Final   Value: 05/27/2014 19:45     Performed at [place]     Final   Value: NO GROWTH     Performed at [facility]   Culture     Final   Value: NO GROWTH     Performed at [facility]   Report Status 05/28/2014 FINAL   Final     Studies: US Renal  05/28/2014   CLINICAL DATA:  Increased BUN and creatinine, acute renal failure  EXAM: RENAL/URINARY TRACT ULTRASOUND COMPLETE   COMPARISON:  None.  FINDINGS: Right Kidney:  Length: 11.1. Echogenicity within normal limits. No mass or hydronephrosis visualized.  Left Kidney:  Length: 13.3. Echogenicity within normal limits. No mass or hydronephrosis visualized.  Bladder:  Non visualized due to Foley catheter.  IMPRESSION: 1. No hydronephrosis or diagnostic renal calculus.   Electronically Signed   By: Lahoma Crocker M.D.   On: 05/28/2014 11:14    Scheduled Meds: . beta carotene w/minerals  1 tablet Oral Daily  . docusate sodium  100 mg Oral BID  . ferrous sulfate  325 mg Oral TID PC  . heparin subcutaneous  5,000 Units Subcutaneous 3 times per day  . pantoprazole  40 mg Oral Q0600   Continuous Infusions: . furosemide (LASIX) infusion 4 mg/hr (05/29/14 1111)     Charlynne Cousins  [facility] Pager [phone]. If 8PM-8AM, please contact night-coverage at [email or web address], password [facility] 05/30/2014, 7:05 AM  LOS: 3 days      **Disclaimer: This note may have been dictated with voice recognition software. Similar sounding words can inadvertently be transcribed and this note may contain transcription errors which may not have been corrected upon publication of note.**

## 2014-05-30 NOTE — Progress Notes (Signed)
Occupational Therapy Treatment Patient Details Name: Josten Warmuth MRN: [id number] DOB: August 28, 1946 Today's Date: 05/30/2014    History of present illness Pt with history of arthritis status post right total hip arthroplasty 05/22/2014, hypertension who presents to the ED with shortness of breath on exertion, worsening bilateral lower extremity edema x3 days, generalized weakness, decreased appetite and decreased urine output.   OT comments  Pt is making good progress in OT.  Did not need any cues for THPs today.  Did need one cue for hand placement with stand to sit and also for safety to sidestep through tight space  Follow Up Recommendations  No OT follow up    Equipment Recommendations  None recommended by OT    Recommendations for Other Services      Precautions / Restrictions Precautions Precautions: Posterior Hip Precaution Comments: Pt recalls all THP without cues Restrictions Weight Bearing Restrictions: Yes RLE Weight Bearing: Weight bearing as tolerated Other Position/Activity Restrictions: Pt states Dr Gladstone Lighter has verbally instructed him to WBAT       Mobility Bed Mobility Overal bed mobility: Needs Assistance Bed Mobility: Supine to Sit     Supine to sit: Min guard Sit to supine: Min guard   General bed mobility comments: pt able to lift bil LEs into bed  Transfers Overall transfer level: Needs assistance Equipment used: Rolling walker (2 wheeled) Transfers: Sit to/from Stand Sit to Stand: Supervision         General transfer comment: cues for UEs    Balance                                   ADL Overall ADL's : Needs assistance/impaired     Grooming: Oral care;Supervision/safety;Standing       Lower Body Bathing: Adhering to hip precautions;Sit to/from stand;With adaptive equipment;Supervison/ safety           Toilet Transfer: Min guard;BSC;Ambulation             General ADL Comments: completed bathing in bathroom.  Pt  did not want to change socks nor remove ted hose:  stated wife would do that this afternoon.  Pt states he feels comfortable with AE.  when returning to bed, space was tight, and pt attempted to lift walker to clear obstacle.  Cued to sidestep for safety.      Vision                     Perception     Praxis      Cognition   Behavior During Therapy: WFL for tasks assessed/performed Overall Cognitive Status: Within Functional Limits for tasks assessed                       Extremity/Trunk Assessment               Exercises    Shoulder Instructions       General Comments      Pertinent Vitals/ Pain       No c/o pain  Home Living                                          Prior Functioning/Environment              Frequency  Progress Toward Goals  OT Goals(current goals can now be found in the care plan section)  Progress towards OT goals: Progressing toward goals  Acute Rehab OT Goals Patient Stated Goal: get back home with home therapy  Plan      Co-evaluation                 End of Session     Activity Tolerance Patient tolerated treatment well   Patient Left in bed;with call bell/phone within reach   Nurse Communication  (clarified:  wife with pt:  no bed alarm needed)        Time: 1407-1430 OT Time Calculation (min): 23 min  Charges: OT General Charges $OT Visit: 1 Procedure OT Treatments $Self Care/Home Management : 23-37 mins  Riccardo Holeman 05/30/2014, 3:10 PM Lesle Chris, OTR/L [phone] 05/30/2014

## 2014-05-30 NOTE — Progress Notes (Signed)
Physical Therapy Treatment Patient Details Name: [name] [name] MRN: [id number] DOB: 02-03-1946 Today's Date: 05/30/2014    History of Present Illness Pt with history of arthritis status post right total hip arthroplasty 05/22/2014, hypertension who presents to the ED with shortness of breath on exertion, worsening bilateral lower extremity edema x3 days, generalized weakness, decreased appetite and decreased urine output.    PT Comments    Pt continues to feel better and progressing well with therapy  Follow Up Recommendations  Home health PT     Equipment Recommendations  None recommended by PT    Recommendations for Other Services OT consult     Precautions / Restrictions Precautions Precautions: Posterior Hip Precaution Comments: Pt recalls all THP without cues Restrictions Weight Bearing Restrictions: Yes Other Position/Activity Restrictions: Pt states Dr Gladstone Lighter has verbally instructed him to WBAT    Mobility  Bed Mobility Overal bed mobility: Needs Assistance Bed Mobility: Supine to Sit     Supine to sit: Min guard     General bed mobility comments: min cues for sequencing  Transfers Overall transfer level: Needs assistance Equipment used: Rolling walker (2 wheeled) Transfers: Sit to/from Stand Sit to Stand: Supervision         General transfer comment: cues for UEs  Ambulation/Gait Ambulation/Gait assistance: Min guard Ambulation Distance (Feet): 220 Feet Assistive device: Rolling walker (2 wheeled) Gait Pattern/deviations: Step-to pattern;Step-through pattern;Shuffle;Trunk flexed     General Gait Details: cues for posture, position from RW, ER on R and initial sequence   Stairs            Wheelchair Mobility    Modified Rankin (Stroke Patients Only)       Balance                                    Cognition Arousal/Alertness: Awake/alert Behavior During Therapy: WFL for tasks assessed/performed Overall Cognitive  Status: Within Functional Limits for tasks assessed                      Exercises Total Joint Exercises Ankle Circles/Pumps: AROM;Both;15 reps;Supine Quad Sets: AROM;Both;10 reps;Supine Gluteal Sets: AROM;Both;10 reps;Supine Heel Slides: AAROM;Right;Supine;20 reps Hip ABduction/ADduction: AAROM;Right;Supine;20 reps Long Arc Quad: AROM;Right;Seated;20 reps    General Comments        Pertinent Vitals/Pain 3-4/10; premed    Home Living                      Prior Function            PT Goals (current goals can now be found in the care plan section) Acute Rehab PT Goals Patient Stated Goal: get back home with home therapy PT Goal Formulation: With patient Time For Goal Achievement: 05/29/14 Potential to Achieve Goals: Good Progress towards PT goals: Progressing toward goals    Frequency  7X/week    PT Plan Current plan remains appropriate    Co-evaluation             End of Session Equipment Utilized During Treatment: Gait belt Activity Tolerance: Patient tolerated treatment well Patient left: in chair;with call bell/phone within reach;with family/visitor present     Time: 1140-1210 PT Time Calculation (min): 30 min  Charges:  $Gait Training: 8-22 mins $Therapeutic Exercise: 8-22 mins                    G Codes:  Rhen Dossantos 05/30/2014, 12:35 PM

## 2014-05-31 LAB — BASIC METABOLIC PANEL
Anion gap: 12 (ref 5–15)
BUN: 22 mg/dL (ref 6–23)
CO2: 31 mEq/L (ref 19–32)
Calcium: 8.6 mg/dL (ref 8.4–10.5)
Chloride: 99 mEq/L (ref 96–112)
Creatinine, Ser: 1.05 mg/dL (ref 0.50–1.35)
GFR calc Af Amer: 82 mL/min — ABNORMAL LOW (ref >90)
GFR calc non Af Amer: 71 mL/min — ABNORMAL LOW (ref >90)
Glucose, Bld: 103 mg/dL — ABNORMAL HIGH (ref 70–99)
Potassium: 3.8 mEq/L (ref 3.7–5.3)
Sodium: 142 mEq/L (ref 137–147)

## 2014-05-31 MED ORDER — LOSARTAN POTASSIUM 50 MG PO TABS
50.0000 mg | ORAL_TABLET | Freq: Two times a day (BID) | ORAL | Status: DC
  Administered 2014-05-31: 50 mg via ORAL
  Filled 2014-05-31 (×2): qty 1

## 2014-05-31 NOTE — Progress Notes (Signed)
Foley catheter D/C'd. Patient tolerated well. Small amount of blood noted after removal. Will continue to monitor.

## 2014-05-31 NOTE — Progress Notes (Signed)
Physical Therapy Treatment Patient Details Name: [name] [name] MRN: [id number] DOB: 05-Jan-1946 Today's Date: 07-Jun-2014    History of Present Illness Pt with history of arthritis status post right total hip arthroplasty 05/22/2014, hypertension who presents to the ED with shortness of breath on exertion, worsening bilateral lower extremity edema x3 days, generalized weakness, decreased appetite and decreased urine output.    PT Comments    Pt progressing to standing therex this date.  Hoping for d/c this pm  Follow Up Recommendations  Home health PT     Equipment Recommendations  None recommended by PT    Recommendations for Other Services OT consult     Precautions / Restrictions Precautions Precautions: Posterior Hip Precaution Booklet Issued: Yes (comment) Precaution Comments: Pt recalls all THP without cues    Mobility  Bed Mobility Overal bed mobility: Needs Assistance Bed Mobility: Supine to Sit     Supine to sit: Supervision        Transfers Overall transfer level: Needs assistance Equipment used: Rolling walker (2 wheeled) Transfers: Sit to/from Stand Sit to Stand: Supervision         General transfer comment: cues for UEs  Ambulation/Gait Ambulation/Gait assistance: Supervision Ambulation Distance (Feet): 35 Feet Assistive device: Rolling walker (2 wheeled) Gait Pattern/deviations: Step-to pattern;Step-through pattern;Shuffle;Trunk flexed     General Gait Details: cues for posture and position from RW   Stairs         General stair comments: Pt declines to attempt - states comfortable with ability  Wheelchair Mobility    Modified Rankin (Stroke Patients Only)       Balance                                    Cognition Arousal/Alertness: Awake/alert Behavior During Therapy: WFL for tasks assessed/performed Overall Cognitive Status: Within Functional Limits for tasks assessed                      Exercises  Total Joint Exercises Heel Slides: AAROM;Right;Supine;20 reps Hip ABduction/ADduction: Right;Supine;20 reps;Standing;AROM;AAROM Long Arc Quad: AROM;Right;Seated;20 reps Marching in Standing: AROM;Right;10 reps;Standing Standing Hip Extension: AROM;Right;10 reps;Standing (and 10 reps hamstring curl on R)    General Comments        Pertinent Vitals/Pain 6/10; ice provided, pain meds requested    Home Living                      Prior Function            PT Goals (current goals can now be found in the care plan section) Acute Rehab PT Goals Patient Stated Goal: get back home with home therapy PT Goal Formulation: With patient Time For Goal Achievement: 05/29/14 Potential to Achieve Goals: Good Progress towards PT goals: Progressing toward goals    Frequency  7X/week    PT Plan Current plan remains appropriate    Co-evaluation             End of Session Equipment Utilized During Treatment: Gait belt Activity Tolerance: Patient tolerated treatment well Patient left: in chair;with call bell/phone within reach     Time: 1345-1408 PT Time Calculation (min): 23 min  Charges:  $Therapeutic Exercise: 8-22 mins                    G Codes:      [name] [name] June 07, 2014, 2:12 PM

## 2014-05-31 NOTE — Progress Notes (Signed)
Pt alert x4 , still unable to urinate MD aware, orders to still processed with d/c.Pt's spouse at the bedside, Pt verbalizes understanding of instructions. Sent home with walker.Will monitor.  Jeanie Sewer, RN 8:15 PM 05/31/2014

## 2014-05-31 NOTE — Progress Notes (Signed)
Subjective:     Patient reports pain as 2 on 0-10 scale.    Objective: Vital signs in last 24 hours: Temp:  [98 F (36.7 C)-98.5 F (36.9 C)] 98 F (36.7 C) (07/25 0557) Pulse Rate:  [80-95] 95 (07/25 0557) Resp:  [18-20] 20 (07/25 0557) BP: (131-150)/(55-90) 150/90 mmHg (07/25 0557) SpO2:  [96 %-98 %] 96 % (07/25 0557) Weight:  [113.218 kg (249 lb 9.6 oz)] 113.218 kg (249 lb 9.6 oz) (07/25 0549)  Intake/Output from previous day: 07/24 0701 - 07/25 0700 In: 10 [I.V.:10] Out: 3900 [Urine:3900] Intake/Output this shift:    No results found for this basename: HGB,  in the last 72 hours No results found for this basename: WBC, RBC, HCT, PLT,  in the last 72 hours  Recent Labs  05/30/14 0500 05/31/14 0405  NA 141 142  K 3.9 3.8  CL 100 99  CO2 30 31  BUN 25* 22  CREATININE 0.99 1.05  GLUCOSE 114* 103*  CALCIUM 8.8 8.6   No results found for this basename: LABPT, INR,  in the last 72 hours  Neurologically intact Incision: dressing C/D/I  Assessment/Plan:     Up with therapy per  Med.  Samaria Anes C 05/31/2014, 7:52 AM

## 2014-05-31 NOTE — Progress Notes (Signed)
Patient unable to void, bladder scan done. 0 ml noted. Dr. Olevia Bowens paged r/t pt unable to void. Encouraged po fluids. Will continue to monitor.

## 2014-05-31 NOTE — Progress Notes (Signed)
Dr. Olevia Bowens returned page r/t pt unable to void. No new orders, given ok to D/C.

## 2014-05-31 NOTE — Discharge Summary (Signed)
Physician Discharge Summary  [name] [name] WIO:[id number] DOB: 06-Oct-1946 DOA: 05/27/2014  PCP: [name] Pac, MD  Admit date: 05/27/2014 Discharge date: 05/31/2014  Time spent: 35 minutes  Recommendations for Outpatient Follow-up:  1. Follow up with cardiology in 1 week. 2. Check b-met. Titrate Bp medications as needed.  BNP    Component Value Date/Time   PROBNP 115.2 05/27/2014 1307   Filed Weights   05/29/14 0648 05/30/14 4268 05/31/14 0549  Weight: 116.3 kg (256 lb 6.3 oz) 114.08 kg (251 lb 8 oz) 113.218 kg (249 lb 9.6 oz)     Discharge Diagnoses:  Principal Problem:   Acute diastolic heart failure, NYHA class 2 Active Problems:   Cardiorenal syndrome with renal failure   Postoperative anemia due to acute blood loss   Acute renal failure   Acidosis   HTN (hypertension)   Leukocytosis   SOB (shortness of breath)   Discharge Condition: stable  Diet recommendation: low sodium    History of present illness:  68 y.o. male  With history of arthritis status post right total hip arthroplasty 05/22/2014, hypertension who presents to the ED with shortness of breath on exertion, worsening bilateral lower extremity edema x3 days, generalized weakness, decreased appetite and decreased urine output. Patient had spoken with urology secondary to decreased urine output was placed on Flomax which patient states has led to some urinary incontinence. Patient denies any fevers, no chills, no chest pain, no nausea, no vomiting, no abdominal pain, no dysuria, no diarrhea, no constipation, no cough, no melena, no hematemesis, no hematochezia. Per wife patient had a 16 pound weight gain over one and a half weeks. Per wife patient's weight was 260 pounds on 05/15/2014. Patient had presented to his PCPs office and was directly to come to the emergency room.   Hospital Course:  Cardiorenal syndrome. Acute diastolic heart faolure/Acute renal failure  - Started lasix drip, then change to  oral tab. Monitor strict I and O's  - On admisionCr 3.7, baseline cr ~1.0. With diuresis cr corrected to baseline. - On admission 120.9->116.3-> 113 kg  - Inserted PICC line 7.22.2015.   Metabolic Acidosis:  - due to renal failure, improve with diuresis.   HTN (hypertension)  - Stable.  - Held ARB-II on admission. - resume on day on discharge.  Leukocytosis  - ? Due to AKI and post surgical procedure.  - Vq scan as below. Cardiac markers negative, echo showed diastolic heart failure  Postoperative anemia due to acute blood loss:  - anemia panel unreliable, ferritin will behave as acute phase reactant.    Procedures: V/Q scan very low probability for PE.  Echo   Renal US   Consultations:  none  Discharge Exam: Filed Vitals:   05/31/14 0557  BP: 150/90  Pulse: 95  Temp: 98 F (36.7 C)  Resp: 20    General: A&O x3 Cardiovascular: RRR Respiratory: good air movement CTA B/L  Discharge Instructions      Discharge Instructions   Diet - low sodium heart healthy    Complete by:  As directed      Increase activity slowly    Complete by:  As directed             Medication List         ammonium lactate 12 % lotion  Commonly known as:  LAC-HYDRIN  Apply 1 application topically 3 (three) times daily as needed for dry skin.     beta carotene w/minerals tablet  Take 1  tablet by mouth daily.     DSS 100 MG Caps  Take 100 mg by mouth 2 (two) times daily.     ferrous sulfate 325 (65 FE) MG tablet  Take 1 tablet (325 mg total) by mouth 3 (three) times daily after meals.     furosemide 20 MG tablet  Commonly known as:  LASIX  Take 20 mg by mouth 2 (two) times daily as needed for fluid.     glucosamine-chondroitin 500-400 MG tablet  Take 1 tablet by mouth 2 (two) times daily.     indomethacin 25 MG capsule  Commonly known as:  INDOCIN  Take 50 mg by mouth 2 (two) times daily with a meal.     losartan 50 MG tablet  Commonly known as:  COZAAR  Take 50 mg  by mouth 2 (two) times daily.     methocarbamol 500 MG tablet  Commonly known as:  ROBAXIN  Take 1 tablet (500 mg total) by mouth every 6 (six) hours as needed for muscle spasms.     oxyCODONE-acetaminophen 5-325 MG per tablet  Commonly known as:  PERCOCET/ROXICET  Take 1 tablet by mouth every 4 (four) hours as needed for moderate pain or severe pain.     rivaroxaban 10 MG Tabs tablet  Commonly known as:  XARELTO  Take 1 tablet (10 mg total) by mouth daily with breakfast.     Saw Palmetto 160 MG Caps  Take 160 mg by mouth daily.       Allergies  Allergen Reactions  . Prednisone     Leg cramps  . Penicillins Rash   Follow-up Information   Follow up with Kirk Ruths, MD On 07/03/2014. (Appointment at 3:30pm for Heart Failure)    Specialty:  Cardiology   Contact information:   [address] [place] [address] [phone]        The results of significant diagnostics from this hospitalization (including imaging, microbiology, ancillary and laboratory) are listed below for reference.    Significant Diagnostic Studies: Dg Chest 2 View  05/27/2014   CLINICAL DATA:  Shortness of breath for 6 days following right hip replacement  EXAM: CHEST  2 VIEW  COMPARISON:  05/16/2014  FINDINGS: Mild cardiac enlargement stable. Vascular pattern normal. Lungs clear. No effusion or pneumothorax.  IMPRESSION: No active cardiopulmonary disease.   Electronically Signed   By: Skipper Cliche M.D.   On: 05/27/2014 13:42   Dg Chest 2 View  05/16/2014   CLINICAL DATA:  Hypertension.  EXAM: CHEST  2 VIEW  COMPARISON:  October 13, 2011.  FINDINGS: The heart size and mediastinal contours are within normal limits. Both lungs are clear. No pneumothorax or pleural effusion is noted. The visualized skeletal structures are unremarkable.  IMPRESSION: No acute cardiopulmonary abnormality seen.   Electronically Signed   By: Sabino [name] M.D.   On: 05/16/2014 14:58   US Renal  05/28/2014    CLINICAL DATA:  Increased BUN and creatinine, acute renal failure  EXAM: RENAL/URINARY TRACT ULTRASOUND COMPLETE  COMPARISON:  None.  FINDINGS: Right Kidney:  Length: 11.1. Echogenicity within normal limits. No mass or hydronephrosis visualized.  Left Kidney:  Length: 13.3. Echogenicity within normal limits. No mass or hydronephrosis visualized.  Bladder:  Non visualized due to Foley catheter.  IMPRESSION: 1. No hydronephrosis or diagnostic renal calculus.   Electronically Signed   By: Lahoma Crocker M.D.   On: 05/28/2014 11:14   Nm Pulmonary Perf And Vent  05/27/2014  CLINICAL DATA:  Shortness of breath for 6 days following total hip arthroplasty. Elevated D-dimer levels and renal failure. Question pulmonary embolism.  EXAM: NUCLEAR MEDICINE VENTILATION - PERFUSION LUNG SCAN  TECHNIQUE: Ventilation images were obtained in multiple projections using inhaled aerosol technetium 99 M DTPA. Perfusion images were obtained in multiple projections after intravenous injection of Tc-60mMAA.  RADIOPHARMACEUTICALS:  36.2 mCi Tc-921mTPA aerosol and 5.3 mCi Tc-9948mA  COMPARISON:  Chest radiographs 05/27/2014.  FINDINGS: Ventilation: No focal ventilation defect identified. There is clumping of the aerosol within central airways.  Perfusion: No wedge shaped peripheral perfusion defects to suggest acute pulmonary embolism.  IMPRESSION: Very low probability for acute pulmonary embolism.   Electronically Signed   By: BilCamie PatienceD.   On: 05/27/2014 19:40   Dg Hip Portable 1 View Right  05/22/2014   CLINICAL DATA:  Status post right hip replacement  EXAM: PORTABLE RIGHT HIP - 1 VIEW  COMPARISON:  None.  FINDINGS: A new right hip prosthesis is noted. The femoral component is well seated. A surgical drain is seen. No acute abnormality is noted.   Electronically Signed   By: MarInez CatalinaD.   On: 05/22/2014 10:27    Microbiology: Recent Results (from the past 240 hour(s))  URINE CULTURE     Status: None   Collection  Time    05/27/14  1:20 PM      Result Value Ref Range Status   Specimen Description URINE, CLEAN CATCH   Final   Special Requests NONE   Final   Culture  Setup Time     Final   Value: 05/27/2014 19:45     Performed at [facility]  Final   Value: NO GROWTH     [facility]     Final   Value: NO GROWTH     [facility]formed at [facility] 05/28/2014 FINAL   Final     Labs: Basic Metabolic Panel:  Recent Labs Lab 05/27/14 1307 05/27/14 1636 05/27/14 1840 05/28/14 0528 05/29/14 0508 05/30/14 0500 05/31/14 0405  NA 133*  --   --  138 141 141 142  K 4.4  --   --  4.3 3.9 3.9 3.8  CL 97  --   --  101 103 100 99  CO2 17*  --   --  _0 GLUCOSE 113*  --   --  105* 103* 114* 103*  BUN 85*  --   --  66* 37* 25* 22  CREATININE 3.78*  --   --  1.83* 1.07 0.99 1.05  CALCIUM 8.7  --   --  8.8 8.4 8.8 8.6  MG  --  2.5 2.6*  --   --   --   --    Liver Function Tests:  Recent Labs Lab 05/27/14 1636 05/28/14 0528  AST 35 36  ALT 38 53  ALKPHOS 78 86  BILITOT 1.2 1.1  PROT 6.0 6.0  ALBUMIN 2.8* 2.7*   No results found for this basename: LIPASE, AMYLASE,  in the last 168 hours No results found for this basename: AMMONIA,  in the last 168 hours CBC:  Recent Labs Lab 05/27/14 1307 05/28/14 0528  WBC 13.7* 10.7*  NEUTROABS 9.2*  --   HGB 8.2* 8.9*  HCT 23.3* 26.1*  MCV 88.6 90.3  PLT 197 224   Cardiac Enzymes:  Recent Labs Lab 05/27/14 1307 05/27/14 1840 05/28/14 0004 05/28/14 0528  TROPONINI <0.30 <0.30 <0.30 <0.30   BNP: BNP (last 3 results)  Recent Labs  05/27/14 1307  PROBNP 115.2   CBG: No results found for this basename: GLUCAP,  in the last 168 hours    Signed:  Charlynne Cousins  [facility] 05/31/2014, 10:39 AM   **Disclaimer: This note may have been dictated with voice recognition software. Similar sounding words can inadvertently be  transcribed and this note may contain transcription errors which may not have been corrected upon publication of note.**

## 2014-05-31 NOTE — Progress Notes (Signed)
Discharge orders given, pt verbalized understanding, waiting on wife to leave.

## 2014-06-01 ENCOUNTER — Inpatient Hospital Stay (HOSPITAL_COMMUNITY)
Admission: EM | Admit: 2014-06-01 | Discharge: 2014-06-04 | DRG: 683 | Disposition: A | Discharge status: Home Health | Payer: Medicare Other | Attending: Internal Medicine | Admitting: Internal Medicine

## 2014-06-01 ENCOUNTER — Encounter (HOSPITAL_COMMUNITY): Payer: Self-pay | Admitting: Emergency Medicine

## 2014-06-01 ENCOUNTER — Emergency Department (HOSPITAL_COMMUNITY): Payer: Medicare Other

## 2014-06-01 DIAGNOSIS — E871 Hypo-osmolality and hyponatremia: Secondary | ICD-10-CM | POA: Diagnosis present

## 2014-06-01 DIAGNOSIS — I10 Essential (primary) hypertension: Secondary | ICD-10-CM | POA: Diagnosis not present

## 2014-06-01 DIAGNOSIS — Z87891 Personal history of nicotine dependence: Secondary | ICD-10-CM | POA: Diagnosis not present

## 2014-06-01 DIAGNOSIS — N179 Acute kidney failure, unspecified: Secondary | ICD-10-CM | POA: Diagnosis not present

## 2014-06-01 DIAGNOSIS — Z801 Family history of malignant neoplasm of trachea, bronchus and lung: Secondary | ICD-10-CM | POA: Diagnosis not present

## 2014-06-01 DIAGNOSIS — M129 Arthropathy, unspecified: Secondary | ICD-10-CM | POA: Diagnosis present

## 2014-06-01 DIAGNOSIS — I130 Hypertensive heart and chronic kidney disease with heart failure and stage 1 through stage 4 chronic kidney disease, or unspecified chronic kidney disease: Secondary | ICD-10-CM | POA: Diagnosis present

## 2014-06-01 DIAGNOSIS — R918 Other nonspecific abnormal finding of lung field: Secondary | ICD-10-CM | POA: Diagnosis not present

## 2014-06-01 DIAGNOSIS — I509 Heart failure, unspecified: Secondary | ICD-10-CM | POA: Diagnosis present

## 2014-06-01 DIAGNOSIS — N189 Chronic kidney disease, unspecified: Secondary | ICD-10-CM | POA: Diagnosis present

## 2014-06-01 DIAGNOSIS — Z820 Family history of epilepsy and other diseases of the nervous system: Secondary | ICD-10-CM

## 2014-06-01 DIAGNOSIS — D72829 Elevated white blood cell count, unspecified: Secondary | ICD-10-CM | POA: Diagnosis present

## 2014-06-01 DIAGNOSIS — D638 Anemia in other chronic diseases classified elsewhere: Secondary | ICD-10-CM | POA: Diagnosis present

## 2014-06-01 DIAGNOSIS — I5032 Chronic diastolic (congestive) heart failure: Secondary | ICD-10-CM | POA: Diagnosis not present

## 2014-06-01 DIAGNOSIS — D62 Acute posthemorrhagic anemia: Secondary | ICD-10-CM

## 2014-06-01 DIAGNOSIS — D649 Anemia, unspecified: Secondary | ICD-10-CM

## 2014-06-01 DIAGNOSIS — R339 Retention of urine, unspecified: Secondary | ICD-10-CM

## 2014-06-01 DIAGNOSIS — Z96649 Presence of unspecified artificial hip joint: Secondary | ICD-10-CM

## 2014-06-01 DIAGNOSIS — R609 Edema, unspecified: Secondary | ICD-10-CM

## 2014-06-01 DIAGNOSIS — Z96641 Presence of right artificial hip joint: Secondary | ICD-10-CM

## 2014-06-01 HISTORY — DX: Unspecified diastolic (congestive) heart failure: I50.30

## 2014-06-01 LAB — BASIC METABOLIC PANEL
Anion gap: 16 — ABNORMAL HIGH (ref 5–15)
BUN: 40 mg/dL — ABNORMAL HIGH (ref 6–23)
CO2: 26 mEq/L (ref 19–32)
Calcium: 9.3 mg/dL (ref 8.4–10.5)
Chloride: 92 mEq/L — ABNORMAL LOW (ref 96–112)
Creatinine, Ser: 2.43 mg/dL — ABNORMAL HIGH (ref 0.50–1.35)
GFR calc Af Amer: 30 mL/min — ABNORMAL LOW (ref >90)
GFR calc non Af Amer: 26 mL/min — ABNORMAL LOW (ref >90)
Glucose, Bld: 122 mg/dL — ABNORMAL HIGH (ref 70–99)
Potassium: 3.9 mEq/L (ref 3.7–5.3)
Sodium: 134 mEq/L — ABNORMAL LOW (ref 137–147)

## 2014-06-01 LAB — URINALYSIS, ROUTINE W REFLEX MICROSCOPIC
Bilirubin Urine: NEGATIVE
Bilirubin Urine: NEGATIVE
Glucose, UA: NEGATIVE mg/dL
Glucose, UA: NEGATIVE mg/dL
Ketones, ur: NEGATIVE mg/dL
Ketones, ur: NEGATIVE mg/dL
Leukocytes, UA: NEGATIVE
Nitrite: NEGATIVE
Nitrite: NEGATIVE
Protein, ur: NEGATIVE mg/dL
Protein, ur: NEGATIVE mg/dL
Specific Gravity, Urine: 1.013 (ref 1.005–1.030)
Specific Gravity, Urine: 1.016 (ref 1.005–1.030)
Urobilinogen, UA: 0.2 mg/dL (ref 0.0–1.0)
Urobilinogen, UA: 0.2 mg/dL (ref 0.0–1.0)
pH: 6 (ref 5.0–8.0)
pH: 5 (ref 5.0–8.0)

## 2014-06-01 LAB — URINE MICROSCOPIC-ADD ON

## 2014-06-01 LAB — CBC WITH DIFFERENTIAL/PLATELET
Basophils Absolute: 0 10*3/uL (ref 0.0–0.1)
Basophils Relative: 0 % (ref 0–1)
Eosinophils Absolute: 0 10*3/uL (ref 0.0–0.7)
Eosinophils Relative: 0 % (ref 0–5)
HCT: 29 % — ABNORMAL LOW (ref 39.0–52.0)
Hemoglobin: 9.9 g/dL — ABNORMAL LOW (ref 13.0–17.0)
Lymphocytes Relative: 21 % (ref 12–46)
Lymphs Abs: 4.7 10*3/uL — ABNORMAL HIGH (ref 0.7–4.0)
MCH: 30.7 pg (ref 26.0–34.0)
MCHC: 34.1 g/dL (ref 30.0–36.0)
MCV: 90.1 fL (ref 78.0–100.0)
Monocytes Absolute: 1.3 10*3/uL — ABNORMAL HIGH (ref 0.1–1.0)
Monocytes Relative: 6 % (ref 3–12)
Neutro Abs: 16.8 10*3/uL — ABNORMAL HIGH (ref 1.7–7.7)
Neutrophils Relative %: 73 % (ref 43–77)
Platelets: 285 10*3/uL (ref 150–400)
RBC: 3.22 MIL/uL — ABNORMAL LOW (ref 4.22–5.81)
RDW: 14.4 % (ref 11.5–15.5)
WBC: 22.8 10*3/uL — ABNORMAL HIGH (ref 4.0–10.5)

## 2014-06-01 MED ORDER — METHOCARBAMOL 500 MG PO TABS
500.0000 mg | ORAL_TABLET | Freq: Once | ORAL | Status: AC
  Administered 2014-06-01: 500 mg via ORAL
  Filled 2014-06-01: qty 1

## 2014-06-01 MED ORDER — RIVAROXABAN 10 MG PO TABS
10.0000 mg | ORAL_TABLET | Freq: Every day | ORAL | Status: DC
  Administered 2014-06-01 – 2014-06-03 (×3): 10 mg via ORAL
  Filled 2014-06-01 (×4): qty 1

## 2014-06-01 MED ORDER — AMMONIUM LACTATE 12 % EX LOTN
1.0000 | TOPICAL_LOTION | Freq: Three times a day (TID) | CUTANEOUS | Status: DC | PRN
Start: 2014-06-01 — End: 2014-06-04

## 2014-06-01 MED ORDER — LIDOCAINE HCL 2 % EX GEL
1.0000 "application " | Freq: Once | CUTANEOUS | Status: AC
  Administered 2014-06-01: 1 via URETHRAL
  Filled 2014-06-01: qty 10

## 2014-06-01 MED ORDER — ONDANSETRON HCL 4 MG PO TABS: 4.0000 mg | ORAL_TABLET | Freq: Four times a day (QID) | ORAL | Status: DC | PRN

## 2014-06-01 MED ORDER — OCUVITE PO TABS
1.0000 | ORAL_TABLET | Freq: Every day | ORAL | Status: DC
  Administered 2014-06-01 – 2014-06-04 (×4): 1 via ORAL
  Filled 2014-06-01 (×4): qty 1

## 2014-06-01 MED ORDER — ONDANSETRON HCL 4 MG/2ML IJ SOLN: 4.0000 mg | Freq: Four times a day (QID) | INTRAMUSCULAR | Status: DC | PRN

## 2014-06-01 MED ORDER — METHOCARBAMOL 500 MG PO TABS: 500.0000 mg | ORAL_TABLET | Freq: Four times a day (QID) | ORAL | Status: DC | PRN

## 2014-06-01 MED ORDER — FUROSEMIDE 20 MG PO TABS
20.0000 mg | ORAL_TABLET | Freq: Two times a day (BID) | ORAL | Status: DC | PRN
  Filled 2014-06-01: qty 1

## 2014-06-01 MED ORDER — DOCUSATE SODIUM 100 MG PO CAPS
100.0000 mg | ORAL_CAPSULE | Freq: Two times a day (BID) | ORAL | Status: DC
  Filled 2014-06-01 (×7): qty 1

## 2014-06-01 MED ORDER — OXYCODONE-ACETAMINOPHEN 5-325 MG PO TABS
1.0000 | ORAL_TABLET | ORAL | Status: DC | PRN
  Administered 2014-06-02: 1 via ORAL
  Filled 2014-06-01: qty 1

## 2014-06-01 MED ORDER — HYDROMORPHONE HCL PF 1 MG/ML IJ SOLN: 1.0000 mg | INTRAMUSCULAR | Status: DC | PRN

## 2014-06-01 MED ORDER — INDOMETHACIN 50 MG PO CAPS
50.0000 mg | ORAL_CAPSULE | Freq: Two times a day (BID) | ORAL | Status: DC
  Administered 2014-06-01 – 2014-06-03 (×4): 50 mg via ORAL
  Filled 2014-06-01 (×8): qty 1

## 2014-06-01 MED ORDER — OXYCODONE-ACETAMINOPHEN 5-325 MG PO TABS
1.0000 | ORAL_TABLET | Freq: Once | ORAL | Status: AC
  Administered 2014-06-01: 1 via ORAL
  Filled 2014-06-01: qty 1

## 2014-06-01 MED ORDER — HYDROCODONE-ACETAMINOPHEN 5-325 MG PO TABS: 1.0000 | ORAL_TABLET | Freq: Four times a day (QID) | ORAL | Status: DC | PRN

## 2014-06-01 MED ORDER — FERROUS SULFATE 325 (65 FE) MG PO TABS
325.0000 mg | ORAL_TABLET | Freq: Three times a day (TID) | ORAL | Status: DC
  Administered 2014-06-01 – 2014-06-04 (×8): 325 mg via ORAL
  Filled 2014-06-01 (×11): qty 1

## 2014-06-01 NOTE — ED Notes (Signed)
Pt requested to receive scheduled home meds. Dr Eulis Foster notified. Dr Eulis Foster notified that pt wife also request that pt go to [facility]

## 2014-06-01 NOTE — ED Notes (Addendum)
Called main lab to draw labs for pt

## 2014-06-01 NOTE — H&P (Signed)
[facility] History and Physical  Alter Moss NWG:[id number] DOB: 1946/10/05 DOA: 06/01/2014  Referring physician: ED physician PCP: [name], MD   Chief Complaint: urinary retention   HPI:  Pt is 68 yo pleasant male discharged yesterday after being hospitalized for cardiorenal syndrome, came home and was unable to void. He denies similar events in the past. He denies shortness of breath, chest pain, nausea, vomiting, fever, chills, or hematuria. He does not have chronic problems with voiding.   In ED, pt noted to be hemodynamically stable, foley placed and ot feels better. [facility] asked to admit for further evaluation.   Assessment and Plan: Active Problems:   Urinary retention - place foley  - admit to medical floor  - voiding trial in next 24 hours    Acute renal failure - secondary to acute urinary retention - continue Lasix and Place foley as noted above - hold Losartan until renal function stabilizes     Anemia of chronic disease - HG and Hct stable and at baseline - no signs of active bleeding    Leukocytosis - unclear etiology, no clear infectious etiology - UA looks clear and no findings on CXR - hold any ABX for now and repeat CBC in AM   Hyponatremia  - follow up with BMP in AM   Chronic diastolic CHF, stage II - continue Lasix   HTN - hold Losartan until renal function stabilizes   Radiological Exams on Admission: Dg Chest 2 View  06/01/2014   CLINICAL DATA:  URINARY RETENTION EDEMA  EXAM: CHEST - 2 VIEW  COMPARISON:  05/27/2014  FINDINGS: TORTUOUS THORACIC AORTA. HEART SIZE UPPER LIMITS NORMAL. LUNGS ARE CLEAR. NO EFFUSION. BRIDGING SPURS IN THE MID THORACIC SPINE.  IMPRESSION: NO ACUTE DISEASE.   Electronically Signed   By: Arne Cleveland M.D.   On: 06/01/2014 12:18   Code Status: Full Family Communication: Pt at bedside Disposition Plan: Admit for further evaluation     Review of Systems:  Constitutional: Negative for fever, chills and  malaise/fatigue. Negative for diaphoresis.  HENT: Negative for hearing loss, ear pain, nosebleeds, congestion, sore throat, neck pain, tinnitus and ear discharge.   Eyes: Negative for blurred vision, double vision, photophobia, pain, discharge and redness.  Respiratory: Negative for cough, hemoptysis, sputum production, shortness of breath, wheezing and stridor.   Cardiovascular: Negative for chest pain, palpitations, orthopnea, claudication and leg swelling.  Gastrointestinal: Negative for nausea, vomiting and abdominal pain. Negative for heartburn, constipation, blood in stool and melena.  Genitourinary: Negative for dysuria, urgency, frequency, hematuria and flank pain.  Musculoskeletal: Negative for myalgias, back pain, joint pain and falls.  Skin: Negative for itching and rash.  Neurological: Negative for dizziness and weakness. Negative for tingling, tremors, sensory change, speech change, focal weakness, loss of consciousness and headaches.  Endo/Heme/Allergies: Negative for environmental allergies and polydipsia. Does not bruise/bleed easily.  Psychiatric/Behavioral: Negative for suicidal ideas. The patient is not nervous/anxious.      Past Medical History  Diagnosis Date  . Hypertension   . Arthritis     BIL KNEES  . Eczema   . History of kidney stones   . Adrenal gland cyst   . Herpesviral conjunctivitis     hx of in past causing need for corneal transplant rt eye  . HTN (hypertension) 05/27/2014  . Diastolic heart failure, NYHA class 2     Past Surgical History  Procedure Laterality Date  . Pilonidal cyst excision  1974  . Corneal transplant  1986  .  Hernia repair  1981 /2001 /2012    L ING HERNIA REPAIR X 6  . Total hip arthroplasty Right 05/22/2014    Procedure: RIGHT TOTAL HIP ARTHROPLASTY;  Surgeon: Tobi Bastos, MD;  Location: [facility];  Service: Orthopedics;  Laterality: Right;    Social History:  reports that he quit smoking about 3 years ago. He has never used  smokeless tobacco. He reports that he drinks alcohol. He reports that he does not use illicit drugs.  Allergies  Allergen Reactions  . Prednisone     Leg cramps  . Penicillins Rash    Family History  Problem Relation Age of Onset  . Alzheimer's disease Mother   . Cancer - Other Father     cancer of the uvula  . Lung cancer Other     Prior to Admission medications   Medication Sig Start Date End Date Taking? Authorizing Provider  ammonium lactate (LAC-HYDRIN) 12 % lotion Apply 1 application topically 3 (three) times daily as needed for dry skin.   Yes Historical Provider, MD  beta carotene w/minerals (OCUVITE) tablet Take 1 tablet by mouth daily.   Yes Historical Provider, MD  docusate sodium 100 MG CAPS Take 100 mg by mouth 2 (two) times daily. 05/23/14  Yes Amber Renelda Loma, PA-C  ferrous sulfate 325 (65 FE) MG tablet Take 1 tablet (325 mg total) by mouth 3 (three) times daily after meals. 05/23/14  Yes Amber Renelda Loma, PA-C  furosemide (LASIX) 20 MG tablet Take 20 mg by mouth 2 (two) times daily as needed for fluid.    Yes Historical Provider, MD  glucosamine-chondroitin 500-400 MG tablet Take 1 tablet by mouth 2 (two) times daily.   Yes Historical Provider, MD  HYDROcodone-acetaminophen (NORCO/VICODIN) 5-325 MG per tablet Take 1 tablet by mouth every 6 (six) hours as needed for moderate pain.   Yes Historical Provider, MD  indomethacin (INDOCIN) 25 MG capsule Take 50 mg by mouth 2 (two) times daily with a meal.   Yes Historical Provider, MD  losartan (COZAAR) 50 MG tablet Take 50 mg by mouth 2 (two) times daily.   Yes Historical Provider, MD  methocarbamol (ROBAXIN) 500 MG tablet Take 1 tablet (500 mg total) by mouth every 6 (six) hours as needed for muscle spasms. 05/23/14  Yes Amber Renelda Loma, PA-C  oxyCODONE-acetaminophen (PERCOCET/ROXICET) 5-325 MG per tablet Take 1 tablet by mouth every 4 (four) hours as needed for moderate pain or severe pain.   Yes Historical  Provider, MD  rivaroxaban (XARELTO) 10 MG TABS tablet Take 1 tablet (10 mg total) by mouth daily with breakfast. 05/23/14  Yes Amber Lauren Constable, PA-C  Saw Palmetto 160 MG CAPS Take 160 mg by mouth daily.   Yes Historical Provider, MD    Physical Exam: Filed Vitals:   06/01/14 0935  BP: 115/64  Pulse: 89  Temp: 97.7 F (36.5 C)  TempSrc: Oral  Resp: 16  Weight: 116.121 kg (256 lb)  SpO2: 98%    Physical Exam  Constitutional: Appears well-developed and well-nourished. No distress.  HENT: Normocephalic. External right and left ear normal. Oropharynx is clear and moist.  Eyes: Conjunctivae and EOM are normal. PERRLA, no scleral icterus.  Neck: Normal ROM. Neck supple. No JVD. No tracheal deviation. No thyromegaly.  CVS: RRR, S1/S2 +, no gallops, no carotid bruit.  Pulmonary: Effort and breath sounds normal, no stridor, rhonchi, wheezes, rales.  Abdominal: Soft. BS +,  no distension, tenderness, rebound or guarding.  Musculoskeletal: Normal range  of motion. No edema and no tenderness.  Lymphadenopathy: No lymphadenopathy noted, cervical, inguinal. Neuro: Alert. Normal reflexes, muscle tone coordination. No cranial nerve deficit. Skin: Skin is warm and dry. No rash noted. Not diaphoretic. No erythema. No pallor.  Psychiatric: Normal mood and affect. Behavior, judgment, thought content normal.   Labs on Admission:  Basic Metabolic Panel:  Recent Labs Lab 05/27/14 1636 05/27/14 1840 05/28/14 0528 05/29/14 0508 05/30/14 0500 05/31/14 0405 06/01/14 1056  NA  --   --  138 141 141 142 134*  K  --   --  4.3 3.9 3.9 3.8 3.9  CL  --   --  101 103 100 99 92*  CO2  --   --  25 27 30 31 26   GLUCOSE  --   --  105* 103* 114* 103* 122*  BUN  --   --  66* 37* 25* 22 40*  CREATININE  --   --  1.83* 1.07 0.99 1.05 2.43*  CALCIUM  --   --  8.8 8.4 8.8 8.6 9.3  MG 2.5 2.6*  --   --   --   --   --    Liver Function Tests:  Recent Labs Lab 05/27/14 1636 05/28/14 0528  AST 35 36   ALT 38 53  ALKPHOS 78 86  BILITOT 1.2 1.1  PROT 6.0 6.0  ALBUMIN 2.8* 2.7*   CBC:  Recent Labs Lab 05/27/14 1307 05/28/14 0528 06/01/14 1056  WBC 13.7* 10.7* 22.8*  NEUTROABS 9.2*  --  16.8*  HGB 8.2* 8.9* 9.9*  HCT 23.3* 26.1* 29.0*  MCV 88.6 90.3 90.1  PLT 197 224 285   Cardiac Enzymes:  Recent Labs Lab 05/27/14 1307 05/27/14 1840 05/28/14 0004 05/28/14 0528  TROPONINI <0.30 <0.30 <0.30 <0.30   EKG: Normal sinus rhythm, no ST/T wave changes  Faye Ramsay, MD  [facility] Pager [phone]  If 7PM-7AM, please contact night-coverage [email or web address] Password [facility] 06/01/2014, 2:35 PM

## 2014-06-01 NOTE — ED Notes (Signed)
Pt labs drawn by lab

## 2014-06-01 NOTE — ED Provider Notes (Signed)
CSN: [id number]     Arrival date & time 06/01/14  7782 History   First MD Initiated Contact with Patient 06/01/14 [phone]     Chief Complaint  Patient presents with  . Urinary Retention  . Edema     (Consider location/radiation/quality/duration/timing/severity/associated sxs/prior Treatment) HPI  Patient complains of being unable to void since a Foley catheter, was removed, yesterday at 11 AM. He was discharged, from the hospital, at 8 PM last night. He complains of generalized achiness, and no weakness. He denies shortness of breath, chest pain, nausea, vomiting, fever, chills, or hematuria. He does not have chronic problems with voiding. He had a Foley catheterization, during the hospitalization, during which time he was aggressively diureses. His wife describes him losing 20 pounds, of fluid. He was diagnosed with diastolic heart failure. He is taking his medicine as directed. There are no other known modifying factors.  Past Medical History  Diagnosis Date  . Hypertension   . Arthritis     BIL KNEES  . Eczema   . History of kidney stones   . Adrenal gland cyst   . Herpesviral conjunctivitis     hx of in past causing need for corneal transplant rt eye  . HTN (hypertension) 05/27/2014  . Diastolic heart failure, NYHA class 2    Past Surgical History  Procedure Laterality Date  . Pilonidal cyst excision  1974  . Corneal transplant  1986  . Hernia repair  1981 /2001 /2012    L ING HERNIA REPAIR X 6  . Total hip arthroplasty Right 05/22/2014    Procedure: RIGHT TOTAL HIP ARTHROPLASTY;  Surgeon: Tobi Bastos, MD;  Location: [facility];  Service: Orthopedics;  Laterality: Right;   Family History  Problem Relation Age of Onset  . Alzheimer's disease Mother   . Cancer - Other Father     cancer of the uvula  . Lung cancer Other    History  Substance Use Topics  . Smoking status: Former Smoker    Quit date: 05/17/2011  . Smokeless tobacco: Never Used  . Alcohol Use: Yes   Comment: OCCASIONAL    Review of Systems  All other systems reviewed and are negative.     Allergies  Prednisone and Penicillins  Home Medications   Prior to Admission medications   Medication Sig Start Date End Date Taking? Authorizing Provider  ammonium lactate (LAC-HYDRIN) 12 % lotion Apply 1 application topically 3 (three) times daily as needed for dry skin.   Yes Historical Provider, MD  beta carotene w/minerals (OCUVITE) tablet Take 1 tablet by mouth daily.   Yes Historical Provider, MD  docusate sodium 100 MG CAPS Take 100 mg by mouth 2 (two) times daily. 05/23/14  Yes Amber Renelda Loma, PA-C  ferrous sulfate 325 (65 FE) MG tablet Take 1 tablet (325 mg total) by mouth 3 (three) times daily after meals. 05/23/14  Yes Amber Renelda Loma, PA-C  furosemide (LASIX) 20 MG tablet Take 20 mg by mouth 2 (two) times daily as needed for fluid.    Yes Historical Provider, MD  glucosamine-chondroitin 500-400 MG tablet Take 1 tablet by mouth 2 (two) times daily.   Yes Historical Provider, MD  HYDROcodone-acetaminophen (NORCO/VICODIN) 5-325 MG per tablet Take 1 tablet by mouth every 6 (six) hours as needed for moderate pain.   Yes Historical Provider, MD  indomethacin (INDOCIN) 25 MG capsule Take 50 mg by mouth 2 (two) times daily with a meal.   Yes Historical Provider, MD  losartan (  COZAAR) 50 MG tablet Take 50 mg by mouth 2 (two) times daily.   Yes Historical Provider, MD  methocarbamol (ROBAXIN) 500 MG tablet Take 1 tablet (500 mg total) by mouth every 6 (six) hours as needed for muscle spasms. 05/23/14  Yes Amber Renelda Loma, PA-C  oxyCODONE-acetaminophen (PERCOCET/ROXICET) 5-325 MG per tablet Take 1 tablet by mouth every 4 (four) hours as needed for moderate pain or severe pain.   Yes Historical Provider, MD  rivaroxaban (XARELTO) 10 MG TABS tablet Take 1 tablet (10 mg total) by mouth daily with breakfast. 05/23/14  Yes Amber Lauren Constable, PA-C  Saw Palmetto 160 MG CAPS Take  160 mg by mouth daily.   Yes Historical Provider, MD   BP 115/64  Pulse 89  Temp(Src) 97.7 F (36.5 C) (Oral)  Resp 16  Wt 256 lb (116.121 kg)  SpO2 98% Physical Exam  Nursing note and vitals reviewed. Constitutional: He is oriented to person, place, and time. He appears well-developed and well-nourished.  HENT:  Head: Normocephalic and atraumatic.  Right Ear: External ear normal.  Left Ear: External ear normal.  Eyes: Conjunctivae and EOM are normal. Pupils are equal, round, and reactive to light.  Neck: Normal range of motion and phonation normal. Neck supple.  Cardiovascular: Normal rate, regular rhythm, normal heart sounds and intact distal pulses.   Pulmonary/Chest: Effort normal and breath sounds normal. He exhibits no bony tenderness.  Abdominal: Soft. He exhibits mass. There is no tenderness. There is no rebound and no guarding.  Suprapubic tenderness, with palpable urinary bladder  Musculoskeletal: Normal range of motion.  Neurological: He is alert and oriented to person, place, and time. No cranial nerve deficit or sensory deficit. He exhibits normal muscle tone. Coordination normal.  Skin: Skin is warm, dry and intact.  Psychiatric: He has a normal mood and affect. His behavior is normal. Judgment and thought content normal.    ED Course  Procedures (including critical care time) Labs Review Labs Reviewed  URINALYSIS, ROUTINE W REFLEX MICROSCOPIC - Abnormal; Notable for the following:    Hgb urine dipstick MODERATE (*)    All other components within normal limits  CBC WITH DIFFERENTIAL - Abnormal; Notable for the following:    WBC 22.8 (*)    RBC 3.22 (*)    Hemoglobin 9.9 (*)    HCT 29.0 (*)    Neutro Abs 16.8 (*)    Lymphs Abs 4.7 (*)    Monocytes Absolute 1.3 (*)    All other components within normal limits  BASIC METABOLIC PANEL - Abnormal; Notable for the following:    Sodium 134 (*)    Chloride 92 (*)    Glucose, Bld 122 (*)    BUN 40 (*)     Creatinine, Ser 2.43 (*)    GFR calc non Af Amer 26 (*)    GFR calc Af Amer 30 (*)    Anion gap 16 (*)    All other components within normal limits  URINE CULTURE  URINE MICROSCOPIC-ADD ON    2:13 PM-Consult complete with Dr. Doyle Askew. Patient case explained and discussed. She agrees to admit patient for further evaluation and treatment. Call ended at 1417  Imaging Review Dg Chest 2 View  06/01/2014   CLINICAL DATA:  URINARY RETENTION EDEMA  EXAM: CHEST - 2 VIEW  COMPARISON:  05/27/2014  FINDINGS: TORTUOUS THORACIC AORTA. HEART SIZE UPPER LIMITS NORMAL. LUNGS ARE CLEAR. NO EFFUSION. BRIDGING SPURS IN THE MID THORACIC SPINE.  IMPRESSION: NO ACUTE DISEASE.  Electronically Signed   By: Arne Cleveland M.D.   On: 06/01/2014 12:18     EKG Interpretation None      MDM   Final diagnoses:  Urinary retention  Peripheral edema  Acute kidney injury  Anemia, unspecified anemia type    Urinary retention, cause not clear, with associated leukocytosis, acute kidney injury, and nonspecific malaise. He'll need to be admitted for further evaluation, and treatment. There is no sign of acute decompensated last side congestive heart failure. Leukocytosis, and nonspecific. There is no clear evidence for urinary tract infection. A urine culture has been ordered. Anemia is likely related to surgery based on timing of recent hemoglobin levels.   Nursing Notes Reviewed/ Care Coordinated, and agree without changes. Applicable Imaging Reviewed.  Interpretation of Laboratory Data incorporated into ED treatment  Plan: Admit  Richarda Blade, MD 06/02/14 1148

## 2014-06-01 NOTE — ED Notes (Signed)
Per pt wife, pt was d/c yesterday from [facility] yesterday and foley was removed. Since, pt had little urine output. Pt c/o lower abd pain that "feel like I have to pee and can't." Pt also has pitting edema to bilateral ankles. Pt has hx of renal failure. Pt is A&O and in NAD. Pt wife at bedside

## 2014-06-01 NOTE — ED Notes (Signed)
Pt recently had right hip replacement. Was admitted on 7/21 for acute kidney failure. Wife is a Marine scientist and reports he had a catheter and had 20 L off fluid output from Tuesday to Friday. Pt was discharged from hospital at 2100 last night. Pt has been attempting to urinate since foley catheter has been removed. Scant urine output since then. Wife reports pt weight 249 pounds yesterday, now pt weights 256 pounds. Pt has had flank pain since before discharge, but pain has increased now. bil flank area 4/10. Pt reports he feels like he needs to urinate but cant. Pt has stage 2 pressure ulcer on right buttocks.

## 2014-06-01 NOTE — ED Notes (Signed)
Unable to obtain blood x2 RN made aware

## 2014-06-01 NOTE — ED Notes (Signed)
Requested that secretary call and confirm that pt should go to Med-surg vs tele since he just left a tele assignment yesterday. Dr Doyle Askew confirmed that pt is suitable for med-surg

## 2014-06-02 DIAGNOSIS — I5032 Chronic diastolic (congestive) heart failure: Secondary | ICD-10-CM | POA: Diagnosis present

## 2014-06-02 DIAGNOSIS — N179 Acute kidney failure, unspecified: Principal | ICD-10-CM

## 2014-06-02 DIAGNOSIS — I10 Essential (primary) hypertension: Secondary | ICD-10-CM

## 2014-06-02 DIAGNOSIS — D62 Acute posthemorrhagic anemia: Secondary | ICD-10-CM

## 2014-06-02 LAB — BASIC METABOLIC PANEL
Anion gap: 13 (ref 5–15)
BUN: 38 mg/dL — ABNORMAL HIGH (ref 6–23)
CO2: 27 mEq/L (ref 19–32)
Calcium: 8.9 mg/dL (ref 8.4–10.5)
Chloride: 98 mEq/L (ref 96–112)
Creatinine, Ser: 1.33 mg/dL (ref 0.50–1.35)
GFR calc Af Amer: 62 mL/min — ABNORMAL LOW (ref >90)
GFR calc non Af Amer: 53 mL/min — ABNORMAL LOW (ref >90)
Glucose, Bld: 103 mg/dL — ABNORMAL HIGH (ref 70–99)
Potassium: 4.3 mEq/L (ref 3.7–5.3)
Sodium: 138 mEq/L (ref 137–147)

## 2014-06-02 LAB — CBC
HCT: 26.6 % — ABNORMAL LOW (ref 39.0–52.0)
Hemoglobin: 9 g/dL — ABNORMAL LOW (ref 13.0–17.0)
MCH: 30.6 pg (ref 26.0–34.0)
MCHC: 33.8 g/dL (ref 30.0–36.0)
MCV: 90.5 fL (ref 78.0–100.0)
Platelets: 259 10*3/uL (ref 150–400)
RBC: 2.94 MIL/uL — ABNORMAL LOW (ref 4.22–5.81)
RDW: 14.5 % (ref 11.5–15.5)
WBC: 14.5 10*3/uL — ABNORMAL HIGH (ref 4.0–10.5)

## 2014-06-02 LAB — URINE CULTURE
Colony Count: NO GROWTH
Culture: NO GROWTH

## 2014-06-02 MED ORDER — TAMSULOSIN HCL 0.4 MG PO CAPS
0.4000 mg | ORAL_CAPSULE | Freq: Every day | ORAL | Status: DC
  Administered 2014-06-02 – 2014-06-04 (×3): 0.4 mg via ORAL
  Filled 2014-06-02 (×3): qty 1

## 2014-06-02 MED ORDER — POTASSIUM CHLORIDE CRYS ER 20 MEQ PO TBCR
20.0000 meq | EXTENDED_RELEASE_TABLET | Freq: Two times a day (BID) | ORAL | Status: DC
  Administered 2014-06-02 – 2014-06-04 (×5): 20 meq via ORAL
  Filled 2014-06-02 (×6): qty 1

## 2014-06-02 MED ORDER — FUROSEMIDE 10 MG/ML IJ SOLN
40.0000 mg | Freq: Every day | INTRAMUSCULAR | Status: DC
  Administered 2014-06-02 – 2014-06-04 (×3): 40 mg via INTRAVENOUS
  Filled 2014-06-02 (×3): qty 4

## 2014-06-02 MED ORDER — LORAZEPAM 1 MG PO TABS
1.0000 mg | ORAL_TABLET | Freq: Once | ORAL | Status: AC
  Administered 2014-06-02: 1 mg via ORAL
  Filled 2014-06-02: qty 1

## 2014-06-02 NOTE — Progress Notes (Signed)
Progress Note   Zyaire Mccleod WHQ:[id number] DOB: December 15, 1945 DOA: 06/01/2014 PCP: [name], MD   Brief Narrative:   [name] [name] is an 68 y.o. male with a PMH of recent hospitalization 05/27/14-05/31/14 for acute diastolic heart failure status post right total hip arthroplasty done 05/22/14. Of interest, his BNP was normal but his 2-D echocardiogram, done 05/28/14 showed an EF of 55-60% with grade 1 diastolic dysfunction and no regional wall motion abnormalities. His discharge creatinine was 1.05, however, the patient tells me that he had difficulty voiding prior to his discharge. He was readmitted 06/01/14 with urinary retention and acute renal failure. According to the nursing notes, 1900 cc of urine was drained from his bladder on admission.  Assessment/Plan:   Principal Problem:   Acute renal failure secondary to urinary retention  The patient's renal failure is resolving status post placement of a urinary catheter.  Start Flomax and perform voiding trial later today.  If unable to void, will need urology evaluation.  Recent renal ultrasound was negative for hydronephrosis and echogenicity was within normal limits.  Active Problems:   History of total right hip arthroplasty  Continue Robaxin and Xarelto.    Postoperative anemia due to acute blood loss  Hemoglobin stable at 9 mg/dL. No current indication for transfusion.    HTN (hypertension)  Cozaar currently on hold. Increase Lasix to 40 mg daily given volume overload.    Chronic diastolic CHF (congestive heart failure), NYHA class 2  He only has grade 1 diastolic dysfunction, and his last proBNP was WNL, so I suspect he is volume overloaded as opposed to experiencing significant heart failure. Continue to diurese as renal function can tolerate.  I and O. balance is negative by 2 L, and weight down by 1 pound over the past 24 hours.    DVT Prophylaxis  On Xarelto.  Code Status: Full. Family Communication:  Wife at bedside. Disposition Plan: Home when stable.   IV Access:    Peripheral IV   Procedures and diagnostic studies:    Chest x-ray 06/01/14: No acute disease.   Medical Consultants:    None.   Other Consultants:    None.   Anti-Infectives:    None.  Subjective:   Jamison Oka feels well overall. His appetite is good. No nausea or vomiting. Still has fairly significant swelling of his legs/feet.  Objective:    Filed Vitals:   06/01/14 1559 06/01/14 2057 06/02/14 0418 06/02/14 1354  BP: 123/51 101/49 104/49 108/53  Pulse: 99 76 77 66  Temp: 98.2 F (36.8 C) 98.3 F (36.8 C) 98.3 F (36.8 C) 98 F (36.7 C)  TempSrc: Oral Oral Oral Oral  Resp: 16 18 16 18   Height: 6' (1.829 m)     Weight:   115.667 kg (255 lb)   SpO2: 100% 96% 95% 99%    Intake/Output Summary (Last 24 hours) at 06/02/14 1405 Last data filed at 06/02/14 0820  Gross per 24 hour  Intake    480 ml  Output   2500 ml  Net  -2020 ml    Exam: Gen:  NAD Cardiovascular:  RRR, No M/R/G Respiratory:  Lungs with a few bibasilar crackles Gastrointestinal:  Abdomen soft, NT/ND, + BS Extremities:  3+ edema   Data Reviewed:    Labs: Basic Metabolic Panel:  Recent Labs Lab 05/27/14 1636 05/27/14 1840  05/29/14 0508 05/30/14 0500 05/31/14 0405 06/01/14 1056 06/02/14 0421  NA  --   --   < >  141 141 142 134* 138  K  --   --   < > 3.9 3.9 3.8 3.9 4.3  CL  --   --   < > 103 100 99 92* 98  CO2  --   --   < > 27 30 31 26 27   GLUCOSE  --   --   < > 103* 114* 103* 122* 103*  BUN  --   --   < > 37* 25* 22 40* 38*  CREATININE  --   --   < > 1.07 0.99 1.05 2.43* 1.33  CALCIUM  --   --   < > 8.4 8.8 8.6 9.3 8.9  MG 2.5 2.6*  --   --   --   --   --   --   < > = values in this interval not displayed. GFR Estimated Creatinine Clearance: 69.8 ml/min (by C-G formula based on Cr of 1.33). Liver Function Tests:  Recent Labs Lab 05/27/14 1636 05/28/14 0528  AST 35 36  ALT 38 53    ALKPHOS 78 86  BILITOT 1.2 1.1  PROT 6.0 6.0  ALBUMIN 2.8* 2.7*   Coagulation profile  Recent Labs Lab 05/27/14 1840  INR 1.71*    CBC:  Recent Labs Lab 05/27/14 1307 05/28/14 0528 06/01/14 1056 06/02/14 0421  WBC 13.7* 10.7* 22.8* 14.5*  NEUTROABS 9.2*  --  16.8*  --   HGB 8.2* 8.9* 9.9* 9.0*  HCT 23.3* 26.1* 29.0* 26.6*  MCV 88.6 90.3 90.1 90.5  PLT 197 224 285 259   Cardiac Enzymes:  Recent Labs Lab 05/27/14 1307 05/27/14 1840 05/28/14 0004 05/28/14 0528  TROPONINI <0.30 <0.30 <0.30 <0.30   BNP (last 3 results)  Recent Labs  05/27/14 1307  PROBNP 115.2   Microbiology Recent Results (from the past 240 hour(s))  URINE CULTURE     Status: None   Collection Time    05/27/14  1:20 PM      Result Value Ref Range Status   Specimen Description URINE, CLEAN CATCH   Final   Special Requests NONE   Final   Culture  Setup Time     Final   Value: 05/27/2014 19:45     Performed at [place]     Final   Value: NO GROWTH     Performed at [facility]   Culture     Final   Value: NO GROWTH     Performed at [facility]   Report Status 05/28/2014 FINAL   Final     Medications:   . beta carotene w/minerals  1 tablet Oral Daily  . docusate sodium  100 mg Oral BID  . ferrous sulfate  325 mg Oral TID PC  . indomethacin  50 mg Oral BID WC  . rivaroxaban  10 mg Oral q1800  . tamsulosin  0.4 mg Oral Daily   Continuous Infusions:   Time spent: 35 minutes with > 50% of time discussing current diagnostic test results, clinical impression and plan of care.    LOS: 1 day   RAMA,CHRISTINA  [facility] Pager [phone]. If unable to reach me by pager, please call my cell phone at [phone].  *Please refer to [email or web address], password [email or web address] to get updated schedule on who will round on this patient, as hospitalists switch teams weekly. If 7PM-7AM, please contact night-coverage at [email or web address], password [email or web address] for any  overnight needs.  06/02/2014, 2:05 PM    **  Disclaimer: This note was dictated with voice recognition software. Similar sounding words can inadvertently be transcribed and this note may contain transcription errors which may not have been corrected upon publication of note.**

## 2014-06-02 NOTE — Progress Notes (Addendum)
Per MD order foley catheter was removed at 1500 pt. Was instructed that he will be due to void by 2100 and for pt. To inform RN when he has voided so that a post void In and Out cath could be preformed per MD order. Pt. Verbalized understanding. Will continue to monitor.

## 2014-06-03 DIAGNOSIS — Z96649 Presence of unspecified artificial hip joint: Secondary | ICD-10-CM

## 2014-06-03 LAB — CBC
HCT: 26.6 % — ABNORMAL LOW (ref 39.0–52.0)
Hemoglobin: 8.7 g/dL — ABNORMAL LOW (ref 13.0–17.0)
MCH: 29.9 pg (ref 26.0–34.0)
MCHC: 32.7 g/dL (ref 30.0–36.0)
MCV: 91.4 fL (ref 78.0–100.0)
Platelets: 352 10*3/uL (ref 150–400)
RBC: 2.91 MIL/uL — ABNORMAL LOW (ref 4.22–5.81)
RDW: 14.6 % (ref 11.5–15.5)
WBC: 13.3 10*3/uL — ABNORMAL HIGH (ref 4.0–10.5)

## 2014-06-03 LAB — BASIC METABOLIC PANEL
Anion gap: 13 (ref 5–15)
BUN: 41 mg/dL — ABNORMAL HIGH (ref 6–23)
CO2: 28 mEq/L (ref 19–32)
Calcium: 9.2 mg/dL (ref 8.4–10.5)
Chloride: 99 mEq/L (ref 96–112)
Creatinine, Ser: 1.42 mg/dL — ABNORMAL HIGH (ref 0.50–1.35)
GFR calc Af Amer: 57 mL/min — ABNORMAL LOW (ref >90)
GFR calc non Af Amer: 49 mL/min — ABNORMAL LOW (ref >90)
Glucose, Bld: 111 mg/dL — ABNORMAL HIGH (ref 70–99)
Potassium: 4.7 mEq/L (ref 3.7–5.3)
Sodium: 140 mEq/L (ref 137–147)

## 2014-06-03 LAB — PRO B NATRIURETIC PEPTIDE: Pro B Natriuretic peptide (BNP): 149.8 pg/mL — ABNORMAL HIGH (ref 0–125)

## 2014-06-03 LAB — URINE CULTURE
Colony Count: NO GROWTH
Culture: NO GROWTH

## 2014-06-03 NOTE — Care Management Note (Addendum)
    Page 1 of 1   06/04/2014     9:52:27 AM CARE MANAGEMENT NOTE 06/04/2014  Patient:  [name] [name],[name] [name]   Account Number:  [id number]  Date Initiated:  06/03/2014  Documentation initiated by:  [facility]  Subjective/Objective Assessment:   adm: urinary retention     Action/Plan:   discharge planning   Anticipated DC Date:  06/03/2014   Anticipated DC Plan:  [place]  CM consult      [facility] Choice  HOME HEALTH   Choice offered to / List presented to:  C-1 Patient        [place] arranged  HH-1 RN      [place]   Status of service:  Completed, signed off Medicare Important Message given?  YES (If response is "NO", the following Medicare IM given date fields will be blank) Date Medicare IM given:  06/01/2014 Medicare IM given by:   Date Additional Medicare IM given:   Additional Medicare IM given by:    Discharge Disposition:  [place]  Per UR Regulation:    If discussed at Long Length of Stay Meetings, dates discussed:    Comments:  06/04/14 09:05 CM notes HHRN order placed and F2F completed. [facility] aware.  No other CM needs were communicated.  Mariane Masters, BSN, [facility] [phone].  06/03/14 CM met with pt in room to confirm his choice in home health agency: [facility].  Pt chooses [facility] for foley cath maintenance. Address and contact information verified with pt.  Orders requested.  CM received call from [place] who confirms.  No other CM needs were communicated.  Mariane Masters, BSN, CM [phone].

## 2014-06-03 NOTE — Progress Notes (Addendum)
Progress Note   [name] [name] GYJ:[id number] DOB: 1946/03/11 DOA: 06/01/2014 PCP: [name], MD   Brief Narrative:   Stokely Jeancharles is an 68 y.o. male with a PMH of recent hospitalization 05/27/14-05/31/14 for acute diastolic heart failure status post right total hip arthroplasty done 05/22/14. Of interest, his BNP was normal but his 2-D echocardiogram, done 05/28/14 showed an EF of 55-60% with grade 1 diastolic dysfunction and no regional wall motion abnormalities. His discharge creatinine was 1.05, however, the patient tells me that he had difficulty voiding prior to his discharge. He was readmitted 06/01/14 with urinary retention and acute renal failure. According to the nursing notes, 1900 cc of urine was drained from his bladder on admission. He has failed a voiding trial, and will need to be discharged home with the Foley in place.  Assessment/Plan:   Principal Problem:   Acute renal failure secondary to urinary retention  The patient's renal failure is resolving status post placement of a urinary catheter.  Continue Flomax. Failed voiding trial and now has had the catheter replaced.  Have arranged for urological followup in 2 weeks (spoke with Dr. Tresa Moore).    Recent renal ultrasound was negative for hydronephrosis and echogenicity was within normal limits.  Active Problems:   History of total right hip arthroplasty  Continue Robaxin and Xarelto.  Right hip incision looks a bit red superiorly.  Will ask orthopedic MD to evaluate.    Postoperative anemia due to acute blood loss  Hemoglobin stable at 9 mg/dL. No current indication for transfusion.    HTN (hypertension)  Cozaar currently on hold. Increase Lasix to 40 mg daily given volume overload.    Chronic diastolic CHF (congestive heart failure), NYHA class 2  He only has grade 1 diastolic dysfunction, and his last proBNP was WNL, so I suspect he is volume overloaded as opposed to experiencing significant heart  failure. Repeat proBNP slightly elevated at 149.8.  I and O balance is negative by 3.4 L since admission, and weight down by 1 pound.    DVT Prophylaxis  On Xarelto.  Code Status: Full. Family Communication: Wife at bedside. Disposition Plan: Home when stable.   IV Access:    Peripheral IV   Procedures and diagnostic studies:    Chest x-ray 06/01/14: No acute disease.   Medical Consultants:    Orthopedic Surgery.   Other Consultants:    None.   Anti-Infectives:    None.  Subjective:   [name] [name] had some recurrent back pain last night when he was unable to void on his own. No nausea or vomiting. Still has fairly significant swelling of his legs/feet, but slightly improved. Patient's wife is concerned about his right hip incision which she says is looking a bit red superiorly.  Objective:    Filed Vitals:   06/02/14 0418 06/02/14 1354 06/02/14 2103 06/03/14 0645  BP: 104/49 108/53 124/67 111/59  Pulse: 77 66 90 80  Temp: 98.3 F (36.8 C) 98 F (36.7 C) 98.2 F (36.8 C) 98.4 F (36.9 C)  TempSrc: Oral Oral Oral Oral  Resp: 16 18 18 16   Height:      Weight: 115.667 kg (255 lb)   115.758 kg (255 lb 3.2 oz)  SpO2: 95% 99% 97% 99%    Intake/Output Summary (Last 24 hours) at 06/03/14 1215 Last data filed at 06/03/14 0932  Gross per 24 hour  Intake    600 ml  Output   2000 ml  Net  -1400  ml    Exam: Gen:  NAD Cardiovascular:  RRR, No M/R/G Respiratory:  Lungs clear anteriorly Gastrointestinal:  Abdomen soft, NT/ND, + BS Extremities:  2-3+ edema Right hip incision: Some superior induration and erythema noted. Nontender to palpation.   Data Reviewed:    Labs: Basic Metabolic Panel:  Recent Labs Lab 05/27/14 1636 05/27/14 1840  05/30/14 0500 05/31/14 0405 06/01/14 1056 06/02/14 0421 06/03/14 0435  NA  --   --   < > 141 142 134* 138 140  K  --   --   < > 3.9 3.8 3.9 4.3 4.7  CL  --   --   < > 100 99 92* 98 99  CO2  --   --   < >  30 31 26 27 28   GLUCOSE  --   --   < > 114* 103* 122* 103* 111*  BUN  --   --   < > 25* 22 40* 38* 41*  CREATININE  --   --   < > 0.99 1.05 2.43* 1.33 1.42*  CALCIUM  --   --   < > 8.8 8.6 9.3 8.9 9.2  MG 2.5 2.6*  --   --   --   --   --   --   < > = values in this interval not displayed. GFR Estimated Creatinine Clearance: 65.4 ml/min (by C-G formula based on Cr of 1.42). Liver Function Tests:  Recent Labs Lab 05/27/14 1636 05/28/14 0528  AST 35 36  ALT 38 53  ALKPHOS 78 86  BILITOT 1.2 1.1  PROT 6.0 6.0  ALBUMIN 2.8* 2.7*   Coagulation profile  Recent Labs Lab 05/27/14 1840  INR 1.71*    CBC:  Recent Labs Lab 05/27/14 1307 05/28/14 0528 06/01/14 1056 06/02/14 0421 06/03/14 0435  WBC 13.7* 10.7* 22.8* 14.5* 13.3*  NEUTROABS 9.2*  --  16.8*  --   --   HGB 8.2* 8.9* 9.9* 9.0* 8.7*  HCT 23.3* 26.1* 29.0* 26.6* 26.6*  MCV 88.6 90.3 90.1 90.5 91.4  PLT 197 224 285 259 352   Cardiac Enzymes:  Recent Labs Lab 05/27/14 1307 05/27/14 1840 05/28/14 0004 05/28/14 0528  TROPONINI <0.30 <0.30 <0.30 <0.30   BNP (last 3 results)  Recent Labs  05/27/14 1307 06/03/14 0435  PROBNP 115.2 149.8*   Microbiology Recent Results (from the past 240 hour(s))  URINE CULTURE     Status: None   Collection Time    05/27/14  1:20 PM      Result Value Ref Range Status   Specimen Description URINE, CLEAN CATCH   Final   Special Requests NONE   Final   Culture  Setup Time     Final   Value: 05/27/2014 19:45     Performed at [place]     Final   Value: NO GROWTH     Performed at [facility]   Culture     Final   Value: NO GROWTH     Performed at [facility]   Report Status 05/28/2014 FINAL   Final  URINE CULTURE     Status: None   Collection Time    06/01/14 10:33 AM      Result Value Ref Range Status   Specimen Description URINE, CATHETERIZED   Final   Special Requests NONE   Final   Culture  Setup Time     Final    Value: 06/01/2014 19:03  Performed at [place]     Final   Value: NO GROWTH     Performed at [facility]   Culture     Final   Value: NO GROWTH     Performed at [facility]   Report Status 06/02/2014 FINAL   Final  URINE CULTURE     Status: None   Collection Time    06/01/14  4:47 PM      Result Value Ref Range Status   Specimen Description URINE, CATHETERIZED   Final   Special Requests NONE   Final   Culture  Setup Time     Final   Value: 06/02/2014 09:55     Performed at [place]     Final   Value: NO GROWTH     Performed at [facility]   Culture     Final   Value: NO GROWTH     Performed at [facility]   Report Status 06/03/2014 FINAL   Final     Medications:   . beta carotene w/minerals  1 tablet Oral Daily  . docusate sodium  100 mg Oral BID  . ferrous sulfate  325 mg Oral TID PC  . furosemide  40 mg Intravenous Daily  . indomethacin  50 mg Oral BID WC  . potassium chloride  20 mEq Oral BID  . rivaroxaban  10 mg Oral q1800  . tamsulosin  0.4 mg Oral Daily   Continuous Infusions:   Time spent: 25 minutes.    LOS: 2 days   RAMA,CHRISTINA  [facility] Pager [phone]. If unable to reach me by pager, please call my cell phone at [phone].  *Please refer to [email or web address], password [email or web address] to get updated schedule on who will round on this patient, as hospitalists switch teams weekly. If 7PM-7AM, please contact night-coverage at [email or web address], password [email or web address] for any overnight needs.  06/03/2014, 12:15 PM    **Disclaimer: This note was dictated with voice recognition software. Similar sounding words can inadvertently be transcribed and this note may contain transcription errors which may not have been corrected upon publication of note.**

## 2014-06-04 DIAGNOSIS — R609 Edema, unspecified: Secondary | ICD-10-CM

## 2014-06-04 LAB — BASIC METABOLIC PANEL
Anion gap: 11 (ref 5–15)
BUN: 30 mg/dL — ABNORMAL HIGH (ref 6–23)
CO2: 26 mEq/L (ref 19–32)
Calcium: 8.8 mg/dL (ref 8.4–10.5)
Chloride: 102 mEq/L (ref 96–112)
Creatinine, Ser: 1.01 mg/dL (ref 0.50–1.35)
GFR calc Af Amer: 86 mL/min — ABNORMAL LOW (ref >90)
GFR calc non Af Amer: 74 mL/min — ABNORMAL LOW (ref >90)
Glucose, Bld: 102 mg/dL — ABNORMAL HIGH (ref 70–99)
Potassium: 4.4 mEq/L (ref 3.7–5.3)
Sodium: 139 mEq/L (ref 137–147)

## 2014-06-04 LAB — MICROALBUMIN / CREATININE URINE RATIO
Creatinine, Urine: 26.9 mg/dL
Microalb Creat Ratio: 119.3 mg/g — ABNORMAL HIGH (ref 0.0–30.0)
Microalb, Ur: 3.21 mg/dL — ABNORMAL HIGH (ref 0.00–1.89)

## 2014-06-04 MED ORDER — TAMSULOSIN HCL 0.4 MG PO CAPS: 0.4000 mg | ORAL_CAPSULE | Freq: Every day | ORAL | Status: DC

## 2014-06-04 MED ORDER — POTASSIUM CHLORIDE CRYS ER 20 MEQ PO TBCR: EXTENDED_RELEASE_TABLET | ORAL | Status: DC

## 2014-06-04 MED ORDER — ASPIRIN EC 81 MG PO TBEC: 81.0000 mg | DELAYED_RELEASE_TABLET | Freq: Every day | ORAL | Status: DC

## 2014-06-04 MED ORDER — FUROSEMIDE 20 MG PO TABS: ORAL_TABLET | ORAL | Status: DC

## 2014-06-04 NOTE — Discharge Summary (Signed)
Physician Discharge Summary     Patient ID: [name] [name] MRN: [id number] DOB/AGE: Feb 03, 1946 68 y.o.   Admit date: 05/22/2014 Discharge date: 05/24/2014   Primary Diagnosis: Osteoarthritis, right hip   Admission Diagnoses:  Past Medical History   Diagnosis  Date   .  Hypertension     .  Arthritis         BIL KNEES   .  Eczema     .  History of kidney stones     .  Adrenal gland cyst     .  Herpesviral conjunctivitis         hx of in past causing need for corneal transplant rt eye    Discharge Diagnoses:    Active Problems:   Osteoarthritis of right hip   History of total right hip arthroplasty   Postoperative anemia due to acute blood loss Estimated body mass index is 34.31 kg/(m^2) as calculated from the following:   Height as of this encounter: 6' (1.829 m).   Weight as of this encounter: 114.76 kg (253 lb).   Procedure(s) (LRB): RIGHT TOTAL HIP ARTHROPLASTY (Right)    Consults: None   HPI: [name], 68 y.o. male, has a history of pain and functional disability in the right hip(s) due to arthritis and patient has failed non-surgical conservative treatments for greater than 12 weeks to include NSAID's and/or analgesics, flexibility and strengthening excercises and activity modification. Onset of symptoms was gradual starting 1 year ago with gradually worsening course since that time.The patient noted no past surgery on the right hip(s). Patient currently rates pain in the right hip at 6 out of 10 with activity. Patient has night pain, worsening of pain with activity and weight bearing, pain that interfers with activities of daily living and pain with passive range of motion. Patient has evidence of periarticular osteophytes and joint space narrowing by imaging studies. This condition presents safety issues increasing the risk of falls. There is no current active infection.        Laboratory Data: Admission on 05/22/2014, Discharged on 05/24/2014   Component  Date   Value  Ref Range  Status   .  ABO/RH(D)  05/22/2014  A POS     Final   .  Antibody Screen  05/22/2014  NEG     Final   .  Sample Expiration  05/22/2014  05/25/2014     Final   .  ABO/RH(D)  05/22/2014  A POS     Final   .  WBC  05/23/2014  10.7*  4.0 - 10.5 K/uL  Final   .  RBC  05/23/2014  3.10*  4.22 - 5.81 MIL/uL  Final   .  Hemoglobin  05/23/2014  9.6*  13.0 - 17.0 g/dL  Final   .  HCT  05/23/2014  28.7*  39.0 - 52.0 %  Final   .  MCV  05/23/2014  92.6   78.0 - 100.0 fL  Final   .  MCH  05/23/2014  31.0   26.0 - 34.0 pg  Final   .  MCHC  05/23/2014  33.4   30.0 - 36.0 g/dL  Final   .  RDW  05/23/2014  14.5   11.5 - 15.5 %  Final   .  Platelets  05/23/2014  134*  150 - 400 K/uL  Final   .  Sodium  05/23/2014  139   137 - 147 mEq/L  Final   .  Potassium  05/23/2014  4.7   3.7 - 5.3 mEq/L  Final   .  Chloride  05/23/2014  103   96 - 112 mEq/L  Final   .  CO2  05/23/2014  28   19 - 32 mEq/L  Final   .  Glucose, Bld  05/23/2014  125*  70 - 99 mg/dL  Final   .  BUN  05/23/2014  16   6 - 23 mg/dL  Final   .  Creatinine, Ser  05/23/2014  0.92   0.50 - 1.35 mg/dL  Final   .  Calcium  05/23/2014  8.7   8.4 - 10.5 mg/dL  Final   .  GFR calc non Af Amer  05/23/2014  85*  >90 mL/min  Final   .  GFR calc Af Amer  05/23/2014  >90   >90 mL/min  Final     Comment: (NOTE)                            The eGFR has been calculated using the CKD EPI equation.                            This calculation has not been validated in all clinical situations.                            eGFR's persistently <90 mL/min signify possible Chronic Kidney                            Disease.   .  Anion gap  05/23/2014  8   5 - 15  Final   .  WBC  05/24/2014  11.7*  4.0 - 10.5 K/uL  Final   .  RBC  05/24/2014  3.07*  4.22 - 5.81 MIL/uL  Final   .  Hemoglobin  05/24/2014  9.4*  13.0 - 17.0 g/dL  Final   .  HCT  05/24/2014  28.1*  39.0 - 52.0 %  Final   .  MCV  05/24/2014  91.5   78.0 - 100.0 fL  Final   .  MCH   05/24/2014  30.6   26.0 - 34.0 pg  Final   .  MCHC  05/24/2014  33.5   30.0 - 36.0 g/dL  Final   .  RDW  05/24/2014  14.5   11.5 - 15.5 %  Final   .  Platelets  05/24/2014  143*  150 - 400 K/uL  Final   .  Sodium  05/24/2014  138   137 - 147 mEq/L  Final   .  Potassium  05/24/2014  4.6   3.7 - 5.3 mEq/L  Final   .  Chloride  05/24/2014  103   96 - 112 mEq/L  Final   .  CO2  05/24/2014  26   19 - 32 mEq/L  Final   .  Glucose, Bld  05/24/2014  129*  70 - 99 mg/dL  Final   .  BUN  05/24/2014  19   6 - 23 mg/dL  Final   .  Creatinine, Ser  05/24/2014  0.88   0.50 - 1.35 mg/dL  Final   .  Calcium  05/24/2014  9.2   8.4 - 10.5 mg/dL  Final   .  GFR calc non Af Amer  05/24/2014  86*  >90 mL/min  Final   .  GFR calc Af Amer  05/24/2014  >90   >90 mL/min  Final     Comment: (NOTE)                            The eGFR has been calculated using the CKD EPI equation.                            This calculation has not been validated in all clinical situations.                            eGFR's persistently <90 mL/min signify possible Chronic Kidney                            Disease.   Georgiann Hahn gap  05/24/2014  9   5 - 15  Final   Hospital Outpatient Visit on 05/16/2014   Component  Date  Value  Ref Range  Status   .  WBC  05/16/2014  6.9   4.0 - 10.5 K/uL  Final   .  RBC  05/16/2014  3.96*  4.22 - 5.81 MIL/uL  Final   .  Hemoglobin  05/16/2014  12.2*  13.0 - 17.0 g/dL  Final   .  HCT  05/16/2014  36.6*  39.0 - 52.0 %  Final   .  MCV  05/16/2014  92.4   78.0 - 100.0 fL  Final   .  MCH  05/16/2014  30.8   26.0 - 34.0 pg  Final   .  MCHC  05/16/2014  33.3   30.0 - 36.0 g/dL  Final   .  RDW  05/16/2014  14.1   11.5 - 15.5 %  Final   .  Platelets  05/16/2014  147*  150 - 400 K/uL  Final   .  MRSA, PCR  05/16/2014  NEGATIVE   NEGATIVE  Final   .  Staphylococcus aureus  05/16/2014  NEGATIVE   NEGATIVE  Final     Comment:                                   The Xpert SA Assay (FDA                             approved for NASAL specimens                            in patients over 84 years of age),                            is one component of                            a comprehensive surveillance                            program.  Test performance has  been validated by [facility] for patients greater                            than or equal to 38 year old.                            It is not intended                            to diagnose infection nor to                            guide or monitor treatment.   Marland Kitchen  aPTT  05/16/2014  25   24 - 37 seconds  Final   .  Sodium  05/16/2014  142   137 - 147 mEq/L  Final   .  Potassium  05/16/2014  4.7   3.7 - 5.3 mEq/L  Final   .  Chloride  05/16/2014  106   96 - 112 mEq/L  Final   .  CO2  05/16/2014  26   19 - 32 mEq/L  Final   .  Glucose, Bld  05/16/2014  95   70 - 99 mg/dL  Final   .  BUN  05/16/2014  23   6 - 23 mg/dL  Final   .  Creatinine, Ser  05/16/2014  0.94   0.50 - 1.35 mg/dL  Final   .  Calcium  05/16/2014  9.8   8.4 - 10.5 mg/dL  Final   .  Total Protein  05/16/2014  6.9   6.0 - 8.3 g/dL  Final   .  Albumin  05/16/2014  3.9   3.5 - 5.2 g/dL  Final   .  AST  05/16/2014  21   0 - 37 U/L  Final   .  ALT  05/16/2014  28   0 - 53 U/L  Final   .  Alkaline Phosphatase  05/16/2014  67   39 - 117 U/L  Final   .  Total Bilirubin  05/16/2014  0.6   0.3 - 1.2 mg/dL  Final   .  GFR calc non Af Amer  05/16/2014  84*  >90 mL/min  Final   .  GFR calc Af Amer  05/16/2014  >90   >90 mL/min  Final     Comment: (NOTE)                            The eGFR has been calculated using the CKD EPI equation.                            This calculation has not been validated in all clinical situations.                            eGFR's persistently <90 mL/min signify possible Chronic Kidney  Disease.   .  Anion gap  05/16/2014  10   5 - 15  Final   .  Prothrombin Time   05/16/2014  12.7   11.6 - 15.2 seconds  Final   .  INR  05/16/2014  0.95   0.00 - 1.49  Final   .  Color, Urine  05/16/2014  YELLOW   YELLOW  Final   .  APPearance  05/16/2014  CLEAR   CLEAR  Final   .  Specific Gravity, Urine  05/16/2014  1.024   1.005 - 1.030  Final   .  pH  05/16/2014  5.5   5.0 - 8.0  Final   .  Glucose, UA  05/16/2014  NEGATIVE   NEGATIVE mg/dL  Final   .  Hgb urine dipstick  05/16/2014  TRACE*  NEGATIVE  Final   .  Bilirubin Urine  05/16/2014  NEGATIVE   NEGATIVE  Final   .  Ketones, ur  05/16/2014  NEGATIVE   NEGATIVE mg/dL  Final   .  Protein, ur  05/16/2014  NEGATIVE   NEGATIVE mg/dL  Final   .  Urobilinogen, UA  05/16/2014  0.2   0.0 - 1.0 mg/dL  Final   .  Nitrite  05/16/2014  NEGATIVE   NEGATIVE  Final   .  Leukocytes, UA  05/16/2014  NEGATIVE   NEGATIVE  Final   .  RBC / HPF  05/16/2014  3-6   <3 RBC/hpf  Final   .  Bacteria, UA  05/16/2014  FEW*  RARE  Final   .  Urine-Other  05/16/2014  MUCOUS PRESENT     Final      X-Rays:Dg Chest 2 View   05/16/2014   CLINICAL DATA:  Hypertension.  EXAM: CHEST  2 VIEW  COMPARISON:  October 13, 2011.  FINDINGS: The heart size and mediastinal contours are within normal limits. Both lungs are clear. No pneumothorax or pleural effusion is noted. The visualized skeletal structures are unremarkable.  IMPRESSION: No acute cardiopulmonary abnormality seen.   Electronically Signed   By: Sabino [name] M.D.   On: 05/16/2014 14:58    Dg Hip Portable 1 View Right   05/22/2014   CLINICAL DATA:  Status post right hip replacement  EXAM: PORTABLE RIGHT HIP - 1 VIEW  COMPARISON:  None.  FINDINGS: A new right hip prosthesis is noted. The femoral component is well seated. A surgical drain is seen. No acute abnormality is noted.   Electronically Signed   By: Inez Catalina M.D.   On: 05/22/2014 10:27      Hospital Course: Patient was admitted to [facility] and taken to the OR and underwent the above state procedure without  complications.  Patient tolerated the procedure well and was later transferred to the recovery room and then to the orthopaedic floor for postoperative care.  They were given PO and IV analgesics for pain control following their surgery.  They were given 24 hours of postoperative antibiotics of   Anti-infectives     Start        Dose/Rate  Route  Frequency  Ordered  Stop     05/22/14 1400    clindamycin (CLEOCIN) IVPB 600 mg        600 mg 100 mL/hr over 30 Minutes  Intravenous  Every 6 hours  05/22/14 1136  05/23/14 0159     05/22/14 0839    polymyxin B 500,000 Units, bacitracin 50,000 Units in  sodium chloride irrigation 0.9 % 500 mL irrigation  Status:  Discontinued            As needed  05/22/14 0859  05/22/14 0939     05/22/14 0715    clindamycin (CLEOCIN) IVPB 900 mg        900 mg 100 mL/hr over 30 Minutes  Intravenous   Once  05/22/14 5726  05/22/14 0737       and started on DVT prophylaxis in the form of Xarelto.   PT and OT were ordered for total hip protocol.  The patient was allowed to be WBAT with therapy. Discharge planning was consulted to help with postop disposition and equipment needs.  Patient had a good night on the evening of surgery.  They started to get up OOB with therapy on day one.  Hemovac drain was pulled without difficulty.  The knee immobilizer was removed and discontinued.  Continued to work with therapy into day two. Patient had progressed with therapy and meeting their goals.  Incision was healing well.  Patient was seen in rounds and was ready to go home.     Diet: Regular diet Activity:PWB 50% right LE No bending hip over 90 degrees- A "L" Angle Do not cross legs Do not let foot roll inward When turning these patients a pillow should be placed between the patient's legs to prevent crossing. Patients should have the affected knee fully extended when trying to sit or stand from all surfaces to prevent excessive hip flexion. When ambulating and turning toward  the affected side the affected leg should have the toes turned out prior to moving the walker and the rest of patient's body as to prevent internal rotation/ turning in of the leg. Abduction pillows are the most effective way to prevent a patient from not crossing legs or turning toes in at rest. If an abduction pillow is not ordered placing a regular pillow length wise between the patient's legs is also an effective reminder. It is imperative that these precautions be maintained so that the surgical hip does not dislocate. Follow-up:in 2 weeks Disposition - Home Discharged Condition: stable      Discharge Instructions     Call MD / Call 911     Complete by:  As directed     If you experience chest pain or shortness of breath, CALL 911 and be transported to the hospital emergency room.  If you develope a fever above 101 F, pus (white drainage) or increased drainage or redness at the wound, or calf pain, call your surgeon's office.         Constipation Prevention     Complete by:  As directed     Drink plenty of fluids.  Prune juice may be helpful.  You may use a stool softener, such as Colace (over the counter) 100 mg twice a day.  Use MiraLax (over the counter) for constipation as needed.         Diet - low sodium heart healthy     Complete by:  As directed          Discharge instructions     Complete by:  As directed     Walk with your walker. Partial weightbearing right LE for one week. Then progress to weightbearing as tolerated.   [place] will follow you at home for your therapy Change your dressing over the drain site daily or as needed. Do not change the dressing over the incision.  Shower only, no tub bath. Call if any temperatures greater than 101 or any wound complications: [phone] during the day and ask for Dr. Charlestine Night nurse, Brunilda Payor.         Driving restrictions     Complete by:  As directed     No driving         Follow the hip precautions as taught  in Physical Therapy     Complete by:  As directed          Increase activity slowly as tolerated     Complete by:  As directed                    Medication List      STOP taking these medications           HYDROcodone-acetaminophen 5-325 MG per tablet   Commonly known as:  NORCO/VICODIN         indomethacin 25 MG capsule   Commonly known as:  INDOCIN          TAKE these medications           DSS 100 MG Caps   Take 100 mg by mouth 2 (two) times daily.         ferrous sulfate 325 (65 FE) MG tablet   Take 1 tablet (325 mg total) by mouth 3 (three) times daily after meals.         furosemide 20 MG tablet   Commonly known as:  LASIX   Take 20 mg by mouth 2 (two) times daily as needed.         losartan 50 MG tablet   Commonly known as:  COZAAR   Take 50 mg by mouth 2 (two) times daily.         methocarbamol 500 MG tablet   Commonly known as:  ROBAXIN   Take 1 tablet (500 mg total) by mouth every 6 (six) hours as needed for muscle spasms.         oxyCODONE-acetaminophen 5-325 MG per tablet   Commonly known as:  PERCOCET/ROXICET   Take 2 tablets by mouth every 4 (four) hours as needed for severe pain (Q4-6 hours PRN).         rivaroxaban 10 MG Tabs tablet   Commonly known as:  XARELTO   Take 1 tablet (10 mg total) by mouth daily with breakfast.                 Follow-up Information     Follow up with GIOFFRE,RONALD A, MD. Schedule an appointment as soon as possible for a visit in 2 weeks.     Specialty:  Orthopedic Surgery     Contact information:     [address] [place] [address] [phone]           Follow up with [facility]. [facility])      Contact information:     [address] [place] [address] [phone]          Signed: Ardeen Jourdain, PA-C Orthopaedic Surgery 05/26/2014, 9:49 AM

## 2014-06-04 NOTE — Progress Notes (Signed)
Subjective: Erythema right hip   Objective: Vital signs in last 24 hours: Temp:  [97.9 F (36.6 C)-98.4 F (36.9 C)] 98.2 F (36.8 C) (07/29 0507) Pulse Rate:  [72-75] 75 (07/29 0507) Resp:  [16] 16 (07/28 2100) BP: (115-122)/(49-58) 115/58 mmHg (07/29 0507) SpO2:  [99 %] 99 % (07/29 0507) Weight:  [116.4 kg (256 lb 9.9 oz)] 116.4 kg (256 lb 9.9 oz) (07/29 0507)  Intake/Output from previous day: 07/28 0701 - 07/29 0700 In: 480 [P.O.:480] Out: 2100 [Urine:2100] Intake/Output this shift:     Recent Labs  06/01/14 1056 06/02/14 0421 06/03/14 0435  HGB 9.9* 9.0* 8.7*    Recent Labs  06/02/14 0421 06/03/14 0435  WBC 14.5* 13.3*  RBC 2.94* 2.91*  HCT 26.6* 26.6*  PLT 259 352    Recent Labs  06/03/14 0435 06/04/14 0412  NA 140 139  K 4.7 4.4  CL 99 102  CO2 28 26  BUN 41* 30*  CREATININE 1.42* 1.01  GLUCOSE 111* 102*  CALCIUM 9.2 8.8   No results found for this basename: LABPT, INR,  in the last 72 hours  Right Hip wound inspected and mild Erythema is secondary to his subcutaneus bleeding from Post -Op. Afebrile and wound is dry.  Assessment/Plan: Follow-up in office as discussed with he and his wife.   Janella Rogala A 06/04/2014, 7:26 AM

## 2014-06-04 NOTE — Discharge Summary (Signed)
Physician Discharge Summary  [name] [name] NWG:[id number] DOB: [birth year] DOA: 06/01/2014  PCP: [name], MD  Admit date: 06/01/2014 Discharge date: 06/04/2014   Recommendations for Outpatient Follow-Up:   1. Followup results of microalbumin/creatinine ratio. Consider referral to nephrology if he has micro-proteinuria. 2. Followup scheduled with cardiology and urology.   Discharge Diagnosis:   Principal Problem:    Acute renal failure secondary to acute urinary retention Active Problems:    History of total right hip arthroplasty    Postoperative anemia due to acute blood loss    HTN (hypertension)    Urinary retention    Chronic diastolic CHF (congestive heart failure), NYHA class 2   Discharge Condition: Improved.  Diet recommendation: Low sodium, heart healthy.     History of Present Illness:   [name] [name] is an 68 y.o. male with a PMH of recent hospitalization 05/27/14-05/31/14 for acute diastolic heart failure status post right total hip arthroplasty done 05/22/14. Of interest, his BNP was normal but his 2-D echocardiogram, done 05/28/14 showed an EF of 55-60% with grade 1 diastolic dysfunction and no regional wall motion abnormalities. His discharge creatinine was 1.05, however, the patient tells me that he had difficulty voiding prior to his discharge. He was readmitted 06/01/14 with urinary retention and acute renal failure. According to the nursing notes, 1900 cc of urine was drained from his bladder on admission.  Hospital Course by Problem:   Principal Problem:  Acute renal failure secondary to urinary retention  The patient's renal failure resolved status post placement of a urinary catheter.  Continue Flomax. Failed voiding trial and will be discharged with catheter in place.  Have arranged for urological followup in 2 weeks with Dr. Janice Norrie.  Recent renal ultrasound was negative for hydronephrosis and echogenicity was within normal limits.  Active  Problems:  History of total right hip arthroplasty  Continue Robaxin.  Xarelto discontinued per Dr. Charlestine Night recommendations.  Start ASA.  Right hip incision examined by orthopedics prior to discharge. No evidence of infection.  Postoperative anemia due to acute blood loss  Hemoglobin stable at 9 mg/dL. No current indication for transfusion.  HTN (hypertension)  Cozaar held on admission secondary to renal failure. Safe to resume given normalization of renal function.  Chronic diastolic CHF (congestive heart failure), NYHA class 2  He only has grade 1 diastolic dysfunction, and his last proBNP was WNL, so I suspect he is volume overloaded as opposed to experiencing significant heart failure. Repeat proBNP slightly elevated at 149.8.  We'll discharge him 20 mg of Lasix daily with instructions to take an additional dose for a 2 pound weight gain/24 hours or 7 pounds weight gain over the course of one week. Has cardiology followup scheduled.  DVT Prophylaxis  Xarelto discontinued per orthopedic recommendations. Recommend aspirin for DVT prophylaxis post discharge.  Medical Consultants:    Dr. Latanya Maudlin, Orthopedics.   Discharge Exam:   Filed Vitals:   06/04/14 0507  BP: 115/58  Pulse: 75  Temp: 98.2 F (36.8 C)  Resp:    Filed Vitals:   06/03/14 0645 06/03/14 1426 06/03/14 2100 06/04/14 0507  BP: 111/59 122/53 118/49 115/58  Pulse: 80 74 72 75  Temp: 98.4 F (36.9 C) 97.9 F (36.6 C) 98.4 F (36.9 C) 98.2 F (36.8 C)  TempSrc: Oral Oral Oral Oral  Resp: 16 16 16    Height:      Weight: 115.758 kg (255 lb 3.2 oz)   116.4 kg (256 lb 9.9 oz)  SpO2:  99% 99% 99% 99%    Gen:  NAD Cardiovascular:  RRR, No M/R/G Respiratory: Lungs CTAB Gastrointestinal: Abdomen soft, NT/ND with normal active bowel sounds. Extremities: 2+ edema   The results of significant diagnostics from this hospitalization (including imaging, microbiology, ancillary and laboratory) are listed  below for reference.     Procedures and Diagnostic Studies:    Chest x-ray 06/01/14: No acute disease.  Labs:   Basic Metabolic Panel:  Recent Labs Lab 05/31/14 0405 06/01/14 1056 06/02/14 0421 06/03/14 0435 06/04/14 0412  NA 142 134* 138 140 139  K 3.8 3.9 4.3 4.7 4.4  CL 99 92* 98 99 102  CO2 31 26 27 28 26   GLUCOSE 103* 122* 103* 111* 102*  BUN 22 40* 38* 41* 30*  CREATININE 1.05 2.43* 1.33 1.42* 1.01  CALCIUM 8.6 9.3 8.9 9.2 8.8   GFR Estimated Creatinine Clearance: 92.2 ml/min (by C-G formula based on Cr of 1.01).  CBC:  Recent Labs Lab 06/01/14 1056 06/02/14 0421 06/03/14 0435  WBC 22.8* 14.5* 13.3*  NEUTROABS 16.8*  --   --   HGB 9.9* 9.0* 8.7*  HCT 29.0* 26.6* 26.6*  MCV 90.1 90.5 91.4  PLT 285 259 352   Microbiology Recent Results (from the past 240 hour(s))  URINE CULTURE     Status: None   Collection Time    05/27/14  1:20 PM      Result Value Ref Range Status   Specimen Description URINE, CLEAN CATCH   Final   Special Requests NONE   Final   Culture  Setup Time     Final   Value: 05/27/2014 19:45     Performed at [facility]     Final   Value: NO GROWTH     Performed at [facility]   Culture     Final   Value: NO GROWTH     Performed at [facility]   Report Status 05/28/2014 FINAL   Final  URINE CULTURE     Status: None   Collection Time    06/01/14 10:33 AM      Result Value Ref Range Status   Specimen Description URINE, CATHETERIZED   Final   Special Requests NONE   Final   Culture  Setup Time     Final   Value: 06/01/2014 19:03     Performed at [facility]     Final   Value: NO GROWTH     Performed at [facility]   Culture     Final   Value: NO GROWTH     Performed at [facility]   Report Status 06/02/2014 FINAL   Final  URINE CULTURE     Status: None   Collection Time    06/01/14  4:47 PM      Result Value Ref Range Status   Specimen  Description URINE, CATHETERIZED   Final   Special Requests NONE   Final   Culture  Setup Time     Final   Value: 06/02/2014 09:55     Performed at [place]     Final   Value: NO GROWTH     Performed at [facility]   Culture     Final   Value: NO GROWTH     Performed at [facility]   Report Status 06/03/2014 FINAL   Final  Discharge Instructions:   Discharge Instructions   (HEART FAILURE PATIENTS) Call MD:  Anytime you have any of the following symptoms: 1) 3 pound weight gain in 24 hours or 5 pounds in 1 week 2) shortness of breath, with or without a dry hacking cough 3) swelling in the hands, feet or stomach 4) if you have to sleep on extra pillows at night in order to breathe.    Complete by:  As directed      Call MD for:  severe uncontrolled pain    Complete by:  As directed      Call MD for:  temperature >100.4    Complete by:  As directed      Call MD for:    Complete by:  As directed   Back/flank pain, lack of urine output in foley bag.     Diet - low sodium heart healthy    Complete by:  As directed      Discharge instructions    Complete by:  As directed   You were cared for by Dr. Jacquelynn Cree  (a hospitalist) during your hospital stay. If you have any questions about your discharge medications or the care you received while you were in the hospital after you are discharged, you can call the unit and ask to speak with the hospitalist on call if the hospitalist that took care of you is not available. Once you are discharged, your primary care physician will handle any further medical issues. Please note that NO REFILLS for any discharge medications will be authorized once you are discharged, as it is imperative that you return to your primary care physician (or establish a relationship with a primary care physician if you do not have one) for your aftercare needs so that they can reassess your need for medications and monitor  your lab values.  Any outstanding tests can be reviewed by your PCP at your follow up visit.  It is also important to review any medicine changes with your PCP.  Please bring these d/c instructions with you to your next visit so your physician can review these changes with you.     Face-to-face encounter (required for Medicare/Medicaid patients)    Complete by:  As directed   I Sadey Yandell certify that this patient is under my care and that I, or a nurse practitioner or physician's assistant working with me, had a face-to-face encounter that meets the physician face-to-face encounter requirements with this patient on 06/04/2014. The encounter with the patient was in whole, or in part for the following medical condition(s) which is the primary reason for home health care (List medical condition): Recent THR right, needs ongoing PT.  Previously set up with [place].  The encounter with the patient was in whole, or in part, for the following medical condition, which is the primary reason for home health care:  Recent THR right  I certify that, based on my findings, the following services are medically necessary home health services:  Physical therapy  My clinical findings support the need for the above services:  Unable to leave home safely without assistance and/or assistive device  Further, I certify that my clinical findings support that this patient is homebound due to:  Unable to leave home safely without assistance  Reason for Medically Necessary Home Health Services:  Therapy- Therapeutic Exercises to Increase Strength and Endurance     Home Health    Complete by:  As directed   To provide the following  care/treatments:  PT     Increase activity slowly    Complete by:  As directed      [place]     Complete by:  As directed             Medication List    STOP taking these medications       indomethacin 25 MG capsule  Commonly known as:  INDOCIN     rivaroxaban 10 MG Tabs tablet    Commonly known as:  XARELTO      TAKE these medications       ammonium lactate 12 % lotion  Commonly known as:  LAC-HYDRIN  Apply 1 application topically 3 (three) times daily as needed for dry skin.     beta carotene w/minerals tablet  Take 1 tablet by mouth daily.     DSS 100 MG Caps  Take 100 mg by mouth 2 (two) times daily.     ferrous sulfate 325 (65 FE) MG tablet  Take 1 tablet (325 mg total) by mouth 3 (three) times daily after meals.     furosemide 20 MG tablet  Commonly known as:  LASIX  Take 20 mg daily in the morning.  If you have gained more than 2 pounds/24 hours or 7 lbs in the course of a week, take an extra dose.     glucosamine-chondroitin 500-400 MG tablet  Take 1 tablet by mouth 2 (two) times daily.     HYDROcodone-acetaminophen 5-325 MG per tablet  Commonly known as:  NORCO/VICODIN  Take 1 tablet by mouth every 6 (six) hours as needed for moderate pain.     losartan 50 MG tablet  Commonly known as:  COZAAR  Take 50 mg by mouth 2 (two) times daily.     methocarbamol 500 MG tablet  Commonly known as:  ROBAXIN  Take 1 tablet (500 mg total) by mouth every 6 (six) hours as needed for muscle spasms.     oxyCODONE-acetaminophen 5-325 MG per tablet  Commonly known as:  PERCOCET/ROXICET  Take 1 tablet by mouth every 4 (four) hours as needed for moderate pain or severe pain.     potassium chloride SA 20 MEQ tablet  Commonly known as:  K-DUR,KLOR-CON  Take 1 tablet daily and take an extra dose if you take extra lasix.     Saw Palmetto 160 MG Caps  Take 160 mg by mouth daily.     tamsulosin 0.4 MG Caps capsule  Commonly known as:  FLOMAX  Take 1 capsule (0.4 mg total) by mouth daily.           Follow-up Information   Schedule an appointment as soon as possible for a visit with [name], MD. (As needed)    Specialty:  Family Medicine   Contact information:   [place] [address] [phone]       Follow up with  [facility]. [facility] health nurse for foley management)    Contact information:   [address] [place] [address] [phone]       Follow up with GIOFFRE,RONALD A, MD In 1 week. (Follow up of right hip replacement.)    Specialty:  Orthopedic Surgery   Contact information:   [address] [place] [address] [phone]       Follow up with Arvil Persons, MD On 06/18/2014. (10:30 a.m. )    Specialty:  Urology   Contact information:   [address]  [place] [address] [phone]        Time coordinating discharge: 35 minutes.  Signed:  Sho Salguero  Pager [phone] [facility] 06/04/2014, 9:39 AM

## 2014-06-04 NOTE — Discharge Instructions (Signed)
Acute Kidney Injury Acute kidney injury is a disease in which there is sudden (acute) damage to the kidneys. The kidneys are 2 organs that lie on either side of the spine between the middle of the back and the front of the abdomen. The kidneys:  Remove wastes and extra water from the blood.   Produce important hormones. These help keep bones strong, regulate blood pressure, and help create red blood cells.   Balance the fluids and chemicals in the blood and tissues. A small amount of kidney damage may not cause problems, but a large amount of damage may make it difficult or impossible for the kidneys to work the way they should. Acute kidney injury may develop into long-lasting (chronic) kidney disease. It may also develop into a life-threatening disease called end-stage kidney disease. Acute kidney injury can get worse very quickly, so it should be treated right away. Early treatment may prevent other kidney diseases from developing.  CAUSES   A problem with blood flow to the kidneys. This may be caused by:   Blood loss.   Heart disease.   Severe burns.   Liver disease.  Direct damage to the kidneys. This may be caused by:  Some medicines.   A kidney infection.   Poisoning or consuming toxic substances.   A surgical wound.   A blow to the kidney area.   A problem with urine flow. This may be caused by:   Cancer.   Kidney stones.   An enlarged prostate. SYMPTOMS   Swelling (edema) of the legs, ankles, or feet.   Tiredness (lethargy).   Nausea or vomiting.   Confusion.   Problems with urination, such as:   Painful or burning feeling during urination.   Decreased urine production.   Frequent accidents in children who are potty trained.   Bloody urine.   Muscle twitches and cramps.   Shortness of breath.   Seizures.   Chest pain or pressure. Sometimes, no symptoms are present. DIAGNOSIS Acute kidney injury may be detected  and diagnosed by tests, including blood, urine, imaging, or kidney biopsy tests.  TREATMENT Treatment of acute kidney injury varies depending on the cause and severity of the kidney damage. In mild cases, no treatment may be needed. The kidneys may heal on their own. If acute kidney injury is more severe, your caregiver will treat the cause of the kidney damage, help the kidneys heal, and prevent complications from occurring. Severe cases may require a procedure to remove toxic wastes from the body (dialysis) or surgery to repair kidney damage. Surgery may involve:   Repair of a torn kidney.   Removal of an obstruction. Most of the time, you will need to stay overnight at the hospital.  HOME CARE INSTRUCTIONS:  Follow your prescribed diet.  Only take over-the-counter or prescription medicines as directed by your caregiver.  Do not take any new medicines (prescription, over-the-counter, or nutritional supplements) unless approved by your caregiver. Many medicines can worsen your kidney damage or need to have the dose adjusted.   Keep all follow-up appointments as directed by your caregiver.  Observe your condition to make sure you are healing as expected. SEEK IMMEDIATE MEDICAL CARE IF:  You are feeling ill or have severe pain in the back or side.   Your symptoms return or you have new symptoms.  You have any symptoms of end-stage kidney disease. These include:   Persistent itchiness.   Loss of appetite.   Headaches.   Abnormally dark  or light skin.  Numbness in the hands or feet.   Easy bruising.   Frequent hiccups.   Menstruation stops.   You have a fever.  You have increased urine production.  You have pain or bleeding when urinating. MAKE SURE YOU:   Understand these instructions.  Will watch your condition.  Will get help right away if you are not doing well or get worse Document Released: 05/09/2011 Document Revised: 02/18/2013 Document  Reviewed: 06/22/2012 [facility] Patient Information 2015 [place], [place]. This information is not intended to replace advice given to you by your health care provider. Make sure you discuss any questions you have with your health care provider.  Acute Urinary Retention Acute urinary retention is the temporary inability to urinate. This is a common problem in older men. As men age their prostates become larger and block the flow of urine from the bladder. This is usually a problem that has come on gradually.  HOME CARE INSTRUCTIONS If you are sent home with a Foley catheter and a drainage system, you will need to discuss the best course of action with your health care provider. While the catheter is in, maintain a good intake of fluids. Keep the drainage bag emptied and lower than your catheter. This is so that contaminated urine will not flow back into your bladder, which could lead to a urinary tract infection. There are two main types of drainage bags. One is a large bag that usually is used at night. It has a good capacity that will allow you to sleep through the night without having to empty it. The second type is called a leg bag. It has a smaller capacity, so it needs to be emptied more frequently. However, the main advantage is that it can be attached by a leg strap and can go underneath your clothing, allowing you the freedom to move about or leave your home. Only take over-the-counter or prescription medicines for pain, discomfort, or fever as directed by your health care provider.  SEEK MEDICAL CARE IF:  You develop a low-grade fever.  You experience spasms or leakage of urine with the spasms. SEEK IMMEDIATE MEDICAL CARE IF:   You develop chills or fever.  Your catheter stops draining urine.  Your catheter falls out.  You start to develop increased bleeding that does not respond to rest and increased fluid intake. MAKE SURE YOU:  Understand these instructions.  Will watch your  condition.  Will get help right away if you are not doing well or get worse. Document Released: 01/30/2001 Document Revised: 10/29/2013 Document Reviewed: 04/04/2013 [facility] Patient Information 2015 [place], [place]. This information is not intended to replace advice given to you by your health care provider. Make sure you discuss any questions you have with your health care provider.

## 2014-06-04 NOTE — Progress Notes (Signed)
Discharge instructions given and explained.  Heart failure packet also given and explained as to when to call doctor.  No other questions asked.  Left via wheelchair with family member. Lorrene Reid

## 2014-06-05 DIAGNOSIS — I10 Essential (primary) hypertension: Secondary | ICD-10-CM | POA: Diagnosis not present

## 2014-06-05 DIAGNOSIS — D649 Anemia, unspecified: Secondary | ICD-10-CM | POA: Diagnosis not present

## 2014-06-05 DIAGNOSIS — Z471 Aftercare following joint replacement surgery: Secondary | ICD-10-CM | POA: Diagnosis not present

## 2014-06-05 DIAGNOSIS — Z7901 Long term (current) use of anticoagulants: Secondary | ICD-10-CM | POA: Diagnosis not present

## 2014-06-05 DIAGNOSIS — IMO0001 Reserved for inherently not codable concepts without codable children: Secondary | ICD-10-CM | POA: Diagnosis not present

## 2014-06-05 DIAGNOSIS — Z96649 Presence of unspecified artificial hip joint: Secondary | ICD-10-CM | POA: Diagnosis not present

## 2014-06-06 DIAGNOSIS — Z96649 Presence of unspecified artificial hip joint: Secondary | ICD-10-CM | POA: Diagnosis not present

## 2014-06-06 DIAGNOSIS — R339 Retention of urine, unspecified: Secondary | ICD-10-CM | POA: Diagnosis not present

## 2014-06-06 DIAGNOSIS — Z471 Aftercare following joint replacement surgery: Secondary | ICD-10-CM | POA: Diagnosis not present

## 2014-06-06 DIAGNOSIS — I5030 Unspecified diastolic (congestive) heart failure: Secondary | ICD-10-CM | POA: Diagnosis not present

## 2014-06-06 DIAGNOSIS — I10 Essential (primary) hypertension: Secondary | ICD-10-CM | POA: Diagnosis not present

## 2014-06-06 DIAGNOSIS — Z7901 Long term (current) use of anticoagulants: Secondary | ICD-10-CM | POA: Diagnosis not present

## 2014-06-06 DIAGNOSIS — IMO0001 Reserved for inherently not codable concepts without codable children: Secondary | ICD-10-CM | POA: Diagnosis not present

## 2014-06-06 DIAGNOSIS — D649 Anemia, unspecified: Secondary | ICD-10-CM | POA: Diagnosis not present

## 2014-06-09 DIAGNOSIS — Z96649 Presence of unspecified artificial hip joint: Secondary | ICD-10-CM | POA: Diagnosis not present

## 2014-06-09 DIAGNOSIS — IMO0001 Reserved for inherently not codable concepts without codable children: Secondary | ICD-10-CM | POA: Diagnosis not present

## 2014-06-09 DIAGNOSIS — I10 Essential (primary) hypertension: Secondary | ICD-10-CM | POA: Diagnosis not present

## 2014-06-09 DIAGNOSIS — Z471 Aftercare following joint replacement surgery: Secondary | ICD-10-CM | POA: Diagnosis not present

## 2014-06-09 DIAGNOSIS — Z7901 Long term (current) use of anticoagulants: Secondary | ICD-10-CM | POA: Diagnosis not present

## 2014-06-09 DIAGNOSIS — D649 Anemia, unspecified: Secondary | ICD-10-CM | POA: Diagnosis not present

## 2014-06-10 DIAGNOSIS — IMO0001 Reserved for inherently not codable concepts without codable children: Secondary | ICD-10-CM | POA: Diagnosis not present

## 2014-06-10 DIAGNOSIS — I10 Essential (primary) hypertension: Secondary | ICD-10-CM | POA: Diagnosis not present

## 2014-06-10 DIAGNOSIS — Z471 Aftercare following joint replacement surgery: Secondary | ICD-10-CM | POA: Diagnosis not present

## 2014-06-10 DIAGNOSIS — D649 Anemia, unspecified: Secondary | ICD-10-CM | POA: Diagnosis not present

## 2014-06-10 DIAGNOSIS — Z96649 Presence of unspecified artificial hip joint: Secondary | ICD-10-CM | POA: Diagnosis not present

## 2014-06-10 DIAGNOSIS — Z7901 Long term (current) use of anticoagulants: Secondary | ICD-10-CM | POA: Diagnosis not present

## 2014-06-12 DIAGNOSIS — D649 Anemia, unspecified: Secondary | ICD-10-CM | POA: Diagnosis not present

## 2014-06-12 DIAGNOSIS — Z471 Aftercare following joint replacement surgery: Secondary | ICD-10-CM | POA: Diagnosis not present

## 2014-06-12 DIAGNOSIS — Z96649 Presence of unspecified artificial hip joint: Secondary | ICD-10-CM | POA: Diagnosis not present

## 2014-06-12 DIAGNOSIS — IMO0001 Reserved for inherently not codable concepts without codable children: Secondary | ICD-10-CM | POA: Diagnosis not present

## 2014-06-12 DIAGNOSIS — I10 Essential (primary) hypertension: Secondary | ICD-10-CM | POA: Diagnosis not present

## 2014-06-12 DIAGNOSIS — Z7901 Long term (current) use of anticoagulants: Secondary | ICD-10-CM | POA: Diagnosis not present

## 2014-06-13 DIAGNOSIS — IMO0001 Reserved for inherently not codable concepts without codable children: Secondary | ICD-10-CM | POA: Diagnosis not present

## 2014-06-13 DIAGNOSIS — I10 Essential (primary) hypertension: Secondary | ICD-10-CM | POA: Diagnosis not present

## 2014-06-13 DIAGNOSIS — Z96649 Presence of unspecified artificial hip joint: Secondary | ICD-10-CM | POA: Diagnosis not present

## 2014-06-13 DIAGNOSIS — Z7901 Long term (current) use of anticoagulants: Secondary | ICD-10-CM | POA: Diagnosis not present

## 2014-06-13 DIAGNOSIS — Z471 Aftercare following joint replacement surgery: Secondary | ICD-10-CM | POA: Diagnosis not present

## 2014-06-13 DIAGNOSIS — D649 Anemia, unspecified: Secondary | ICD-10-CM | POA: Diagnosis not present

## 2014-06-16 DIAGNOSIS — Z96649 Presence of unspecified artificial hip joint: Secondary | ICD-10-CM | POA: Diagnosis not present

## 2014-06-16 DIAGNOSIS — Z471 Aftercare following joint replacement surgery: Secondary | ICD-10-CM | POA: Diagnosis not present

## 2014-06-16 DIAGNOSIS — I10 Essential (primary) hypertension: Secondary | ICD-10-CM | POA: Diagnosis not present

## 2014-06-16 DIAGNOSIS — IMO0001 Reserved for inherently not codable concepts without codable children: Secondary | ICD-10-CM | POA: Diagnosis not present

## 2014-06-16 DIAGNOSIS — D649 Anemia, unspecified: Secondary | ICD-10-CM | POA: Diagnosis not present

## 2014-06-16 DIAGNOSIS — Z7901 Long term (current) use of anticoagulants: Secondary | ICD-10-CM | POA: Diagnosis not present

## 2014-06-18 DIAGNOSIS — Z96649 Presence of unspecified artificial hip joint: Secondary | ICD-10-CM | POA: Diagnosis not present

## 2014-06-18 DIAGNOSIS — Z7901 Long term (current) use of anticoagulants: Secondary | ICD-10-CM | POA: Diagnosis not present

## 2014-06-18 DIAGNOSIS — Z471 Aftercare following joint replacement surgery: Secondary | ICD-10-CM | POA: Diagnosis not present

## 2014-06-18 DIAGNOSIS — R5381 Other malaise: Secondary | ICD-10-CM | POA: Diagnosis not present

## 2014-06-18 DIAGNOSIS — R338 Other retention of urine: Secondary | ICD-10-CM | POA: Diagnosis not present

## 2014-06-18 DIAGNOSIS — N401 Enlarged prostate with lower urinary tract symptoms: Secondary | ICD-10-CM | POA: Diagnosis not present

## 2014-06-18 DIAGNOSIS — I10 Essential (primary) hypertension: Secondary | ICD-10-CM | POA: Diagnosis not present

## 2014-06-18 DIAGNOSIS — D649 Anemia, unspecified: Secondary | ICD-10-CM | POA: Diagnosis not present

## 2014-06-18 DIAGNOSIS — R5383 Other fatigue: Secondary | ICD-10-CM | POA: Diagnosis not present

## 2014-06-18 DIAGNOSIS — IMO0001 Reserved for inherently not codable concepts without codable children: Secondary | ICD-10-CM | POA: Diagnosis not present

## 2014-06-18 DIAGNOSIS — Z23 Encounter for immunization: Secondary | ICD-10-CM | POA: Diagnosis not present

## 2014-06-19 DIAGNOSIS — I10 Essential (primary) hypertension: Secondary | ICD-10-CM | POA: Diagnosis not present

## 2014-06-19 DIAGNOSIS — IMO0001 Reserved for inherently not codable concepts without codable children: Secondary | ICD-10-CM | POA: Diagnosis not present

## 2014-06-19 DIAGNOSIS — Z96649 Presence of unspecified artificial hip joint: Secondary | ICD-10-CM | POA: Diagnosis not present

## 2014-06-19 DIAGNOSIS — D649 Anemia, unspecified: Secondary | ICD-10-CM | POA: Diagnosis not present

## 2014-06-19 DIAGNOSIS — Z7901 Long term (current) use of anticoagulants: Secondary | ICD-10-CM | POA: Diagnosis not present

## 2014-06-19 DIAGNOSIS — Z471 Aftercare following joint replacement surgery: Secondary | ICD-10-CM | POA: Diagnosis not present

## 2014-06-20 DIAGNOSIS — Z96649 Presence of unspecified artificial hip joint: Secondary | ICD-10-CM | POA: Diagnosis not present

## 2014-06-20 DIAGNOSIS — D649 Anemia, unspecified: Secondary | ICD-10-CM | POA: Diagnosis not present

## 2014-06-20 DIAGNOSIS — IMO0001 Reserved for inherently not codable concepts without codable children: Secondary | ICD-10-CM | POA: Diagnosis not present

## 2014-06-20 DIAGNOSIS — Z471 Aftercare following joint replacement surgery: Secondary | ICD-10-CM | POA: Diagnosis not present

## 2014-06-20 DIAGNOSIS — Z7901 Long term (current) use of anticoagulants: Secondary | ICD-10-CM | POA: Diagnosis not present

## 2014-06-20 DIAGNOSIS — I10 Essential (primary) hypertension: Secondary | ICD-10-CM | POA: Diagnosis not present

## 2014-06-26 DIAGNOSIS — R339 Retention of urine, unspecified: Secondary | ICD-10-CM | POA: Diagnosis not present

## 2014-06-27 DIAGNOSIS — Z471 Aftercare following joint replacement surgery: Secondary | ICD-10-CM | POA: Diagnosis not present

## 2014-06-27 DIAGNOSIS — IMO0001 Reserved for inherently not codable concepts without codable children: Secondary | ICD-10-CM | POA: Diagnosis not present

## 2014-06-27 DIAGNOSIS — Z7901 Long term (current) use of anticoagulants: Secondary | ICD-10-CM | POA: Diagnosis not present

## 2014-06-27 DIAGNOSIS — D649 Anemia, unspecified: Secondary | ICD-10-CM | POA: Diagnosis not present

## 2014-06-27 DIAGNOSIS — I10 Essential (primary) hypertension: Secondary | ICD-10-CM | POA: Diagnosis not present

## 2014-06-27 DIAGNOSIS — Z96649 Presence of unspecified artificial hip joint: Secondary | ICD-10-CM | POA: Diagnosis not present

## 2014-07-02 ENCOUNTER — Ambulatory Visit: Payer: Medicare Other | Attending: Family Medicine

## 2014-07-02 DIAGNOSIS — R262 Difficulty in walking, not elsewhere classified: Secondary | ICD-10-CM | POA: Diagnosis not present

## 2014-07-02 DIAGNOSIS — J3489 Other specified disorders of nose and nasal sinuses: Secondary | ICD-10-CM | POA: Diagnosis not present

## 2014-07-02 DIAGNOSIS — R269 Unspecified abnormalities of gait and mobility: Secondary | ICD-10-CM | POA: Insufficient documentation

## 2014-07-02 DIAGNOSIS — R5381 Other malaise: Secondary | ICD-10-CM | POA: Insufficient documentation

## 2014-07-02 DIAGNOSIS — I10 Essential (primary) hypertension: Secondary | ICD-10-CM | POA: Diagnosis not present

## 2014-07-02 DIAGNOSIS — E119 Type 2 diabetes mellitus without complications: Secondary | ICD-10-CM | POA: Insufficient documentation

## 2014-07-02 DIAGNOSIS — Z96649 Presence of unspecified artificial hip joint: Secondary | ICD-10-CM | POA: Insufficient documentation

## 2014-07-02 DIAGNOSIS — N19 Unspecified kidney failure: Secondary | ICD-10-CM | POA: Insufficient documentation

## 2014-07-02 DIAGNOSIS — D649 Anemia, unspecified: Secondary | ICD-10-CM | POA: Diagnosis not present

## 2014-07-02 DIAGNOSIS — R339 Retention of urine, unspecified: Secondary | ICD-10-CM | POA: Diagnosis not present

## 2014-07-02 DIAGNOSIS — M6281 Muscle weakness (generalized): Secondary | ICD-10-CM | POA: Insufficient documentation

## 2014-07-02 DIAGNOSIS — IMO0001 Reserved for inherently not codable concepts without codable children: Secondary | ICD-10-CM | POA: Diagnosis not present

## 2014-07-02 DIAGNOSIS — I5030 Unspecified diastolic (congestive) heart failure: Secondary | ICD-10-CM | POA: Diagnosis not present

## 2014-07-02 DIAGNOSIS — I129 Hypertensive chronic kidney disease with stage 1 through stage 4 chronic kidney disease, or unspecified chronic kidney disease: Secondary | ICD-10-CM | POA: Diagnosis not present

## 2014-07-03 ENCOUNTER — Encounter: Payer: Self-pay | Admitting: Cardiology

## 2014-07-03 ENCOUNTER — Ambulatory Visit: Payer: Medicare Other | Admitting: Physical Therapy

## 2014-07-03 ENCOUNTER — Ambulatory Visit (INDEPENDENT_AMBULATORY_CARE_PROVIDER_SITE_OTHER): Payer: Medicare Other | Admitting: Cardiology

## 2014-07-03 VITALS — BP 106/50 | HR 93 | Ht 72.0 in | Wt 252.7 lb

## 2014-07-03 DIAGNOSIS — R339 Retention of urine, unspecified: Secondary | ICD-10-CM | POA: Diagnosis not present

## 2014-07-03 DIAGNOSIS — I5031 Acute diastolic (congestive) heart failure: Secondary | ICD-10-CM

## 2014-07-03 DIAGNOSIS — I10 Essential (primary) hypertension: Secondary | ICD-10-CM | POA: Diagnosis not present

## 2014-07-03 DIAGNOSIS — IMO0001 Reserved for inherently not codable concepts without codable children: Secondary | ICD-10-CM | POA: Diagnosis not present

## 2014-07-03 NOTE — Assessment & Plan Note (Signed)
Followup urology.

## 2014-07-03 NOTE — Patient Instructions (Signed)
Your physician recommends that you schedule a follow-up appointment in: [place]  Your physician recommends that you return for lab work in: [place]

## 2014-07-03 NOTE — Progress Notes (Signed)
HPI: 68 yo male for evaluation of diastolic CHF. Had right hip arthroplasty 05/22/14. Readmitted with volume overload. CR 0.88 to 3.78. Troponin normal. ProBNP 115. RLE doppler showed no DVT. Echo showed normal LV function, grade 1 diastolic dysfunction; mild LAE. Renal ultrasound negative. VQ very low prob. Patient treated with lasix and volume excess, renal insuff improved. Note patient readmitted again due to inability to void and CR again increased 1.05 to 2.43. Improved following foley placement. Patient following discharge was initially treated with an indwelling Foley catheter. He has seen urology and is now undergoing in and out catheterizations. He denies dyspnea, chest pain, palpitations or syncope. He continues to have bilateral lower extremity edema. He saw his primary care yesterday and his potassium is 4.2, BUN 55 and creatinine 1.7 on Lasix 40 mg daily.  Current Outpatient Prescriptions  Medication Sig Dispense Refill  . ammonium lactate (LAC-HYDRIN) 12 % lotion Apply 1 application topically 3 (three) times daily as needed for dry skin.      Marland Kitchen aspirin 325 MG tablet Take 325 mg by mouth 2 (two) times daily.      . beta carotene w/minerals (OCUVITE) tablet Take 1 tablet by mouth daily.      . ciprofloxacin (CIPRO) 500 MG tablet Take 500 mg by mouth 2 (two) times daily.      . ferrous gluconate (FERGON) 324 MG tablet Take 324 mg by mouth 3 (three) times daily with meals.      . furosemide (LASIX) 40 MG tablet Take 40 mg by mouth daily. May add extra 20 mg , for weight gain      . glucosamine-chondroitin 500-400 MG tablet Take 1 tablet by mouth 2 (two) times daily.      Marland Kitchen losartan (COZAAR) 50 MG tablet Take 50 mg by mouth 2 (two) times daily.      . methocarbamol (ROBAXIN) 500 MG tablet Take 1 tablet (500 mg total) by mouth every 6 (six) hours as needed for muscle spasms.  40 tablet  1  . oxyCODONE-acetaminophen (PERCOCET/ROXICET) 5-325 MG per tablet Take 1 tablet by mouth every 4  (four) hours as needed for moderate pain or severe pain.      . potassium chloride SA (K-DUR,KLOR-CON) 20 MEQ tablet Take 1 tablet daily and take an extra dose if you take extra lasix.  60 tablet  1  . Saw Palmetto 160 MG CAPS Take 160 mg by mouth daily.      . tamsulosin (FLOMAX) 0.4 MG CAPS capsule Take 0.8 mg by mouth daily.       No current facility-administered medications for this visit.    Allergies  Allergen Reactions  . Prednisone     Leg cramps  . Penicillins Rash    Past Medical History  Diagnosis Date  . Arthritis     BIL KNEES  . Eczema   . History of kidney stones   . Adrenal gland cyst   . Herpesviral conjunctivitis     hx of in past causing need for corneal transplant rt eye  . HTN (hypertension) 05/27/2014  . Diastolic heart failure, NYHA class 2     Past Surgical History  Procedure Laterality Date  . Pilonidal cyst excision  1974  . Corneal transplant  1986  . Hernia repair  1981 /2001 /2012    L ING HERNIA REPAIR X 6  . Total hip arthroplasty Right 05/22/2014    Procedure: RIGHT TOTAL HIP ARTHROPLASTY;  Surgeon: Tobi Bastos, MD;  Location: [facility];  Service: Orthopedics;  Laterality: Right;  . Tonsillectomy      History   Social History  . Marital Status: Married    Spouse Name: N/A    Number of Children: 2  . Years of Education: N/A   Occupational History  .     Social History Main Topics  . Smoking status: Former Smoker    Quit date: 05/17/2011  . Smokeless tobacco: Never Used  . Alcohol Use: Yes     Comment: OCCASIONAL  . Drug Use: No  . Sexual Activity: No   Other Topics Concern  . Not on file   Social History Narrative  . No narrative on file    Family History  Problem Relation Age of Onset  . Alzheimer's disease Mother   . Cancer - Other Father     cancer of the uvula  . Lung cancer Other     ROS: no fevers or chills, productive cough, hemoptysis, dysphasia, odynophagia, melena, hematochezia, dysuria, hematuria,  rash, seizure activity, orthopnea, PND, pedal edema, claudication. Remaining systems are negative.  Physical Exam:   Blood pressure 106/50, pulse 93, height 6' (1.829 m), weight 252 lb 11.2 oz (114.624 kg).  General:  Well developed/well nourished in NAD Skin warm/dry Patient not depressed No peripheral clubbing Back-normal HEENT-Previous right eye infection Neck supple/normal carotid upstroke bilaterally; no bruits; no JVD; no thyromegaly chest - CTA/ normal expansion CV - RRR/normal S1 and S2; no murmurs, rubs or gallops;  PMI nondisplaced Abdomen -NT/ND, no HSM, no mass, + bowel sounds, no bruit 2+ femoral pulses, no bruits Ext-2+ edema with erythema, no chords, 2+ DP Neuro-grossly nonfocal  ECG 05/27/14 sinus with RVCD

## 2014-07-03 NOTE — Assessment & Plan Note (Signed)
Patient presents for evaluation of diastolic congestive heart failure/edema. Based on history he developed some minimal pedal edema prior to his recent hip replacement. His Lasix was held at the time of his surgery and he developed significant volume overload following his surgery. He also had difficulty urinating with urinary retention. I believe he most likely became volume overloaded because of IV fluids, urinary retention superimposed on diastolic dysfunction. He has improved somewhat. I am hesitant to increase his Lasix as his present BUN is 55 and creatinine 1.7. Continue Lasix 40 mg daily. Check potassium and renal function in one week. He is scheduled to follow up with urology for urodynamic testing. I think this is the predominant issue.

## 2014-07-03 NOTE — Assessment & Plan Note (Signed)
Continue present medications for now.If renal function deteriorates further we will discontinue ARB.

## 2014-07-07 ENCOUNTER — Ambulatory Visit: Payer: Medicare Other | Admitting: Physical Therapy

## 2014-07-07 DIAGNOSIS — IMO0001 Reserved for inherently not codable concepts without codable children: Secondary | ICD-10-CM | POA: Diagnosis not present

## 2014-07-08 ENCOUNTER — Ambulatory Visit: Payer: Medicare Other | Attending: Family Medicine

## 2014-07-08 DIAGNOSIS — I129 Hypertensive chronic kidney disease with stage 1 through stage 4 chronic kidney disease, or unspecified chronic kidney disease: Secondary | ICD-10-CM | POA: Diagnosis not present

## 2014-07-08 DIAGNOSIS — R269 Unspecified abnormalities of gait and mobility: Secondary | ICD-10-CM | POA: Insufficient documentation

## 2014-07-08 DIAGNOSIS — IMO0001 Reserved for inherently not codable concepts without codable children: Secondary | ICD-10-CM | POA: Diagnosis not present

## 2014-07-08 DIAGNOSIS — N19 Unspecified kidney failure: Secondary | ICD-10-CM | POA: Diagnosis not present

## 2014-07-08 DIAGNOSIS — R262 Difficulty in walking, not elsewhere classified: Secondary | ICD-10-CM | POA: Insufficient documentation

## 2014-07-08 DIAGNOSIS — M1610 Unilateral primary osteoarthritis, unspecified hip: Secondary | ICD-10-CM | POA: Diagnosis not present

## 2014-07-08 DIAGNOSIS — R5381 Other malaise: Secondary | ICD-10-CM | POA: Insufficient documentation

## 2014-07-08 DIAGNOSIS — E119 Type 2 diabetes mellitus without complications: Secondary | ICD-10-CM | POA: Diagnosis not present

## 2014-07-08 DIAGNOSIS — Z96649 Presence of unspecified artificial hip joint: Secondary | ICD-10-CM | POA: Insufficient documentation

## 2014-07-08 DIAGNOSIS — M6281 Muscle weakness (generalized): Secondary | ICD-10-CM | POA: Diagnosis not present

## 2014-07-09 DIAGNOSIS — R339 Retention of urine, unspecified: Secondary | ICD-10-CM | POA: Diagnosis not present

## 2014-07-10 DIAGNOSIS — I5031 Acute diastolic (congestive) heart failure: Secondary | ICD-10-CM | POA: Diagnosis not present

## 2014-07-11 LAB — HEPATIC FUNCTION PANEL
ALT: 25 U/L (ref 0–53)
AST: 21 U/L (ref 0–37)
Albumin: 4.1 g/dL (ref 3.5–5.2)
Alkaline Phosphatase: 82 U/L (ref 39–117)
Bilirubin, Direct: 0.1 mg/dL (ref 0.0–0.3)
Indirect Bilirubin: 0.4 mg/dL (ref 0.2–1.2)
Total Bilirubin: 0.5 mg/dL (ref 0.2–1.2)
Total Protein: 6.8 g/dL (ref 6.0–8.3)

## 2014-07-11 LAB — BASIC METABOLIC PANEL WITH GFR
BUN: 22 mg/dL (ref 6–23)
CO2: 26 mEq/L (ref 19–32)
Calcium: 10.1 mg/dL (ref 8.4–10.5)
Chloride: 105 mEq/L (ref 96–112)
Creat: 1.09 mg/dL (ref 0.50–1.35)
GFR, Est African American: 80 mL/min
GFR, Est Non African American: 69 mL/min
Glucose, Bld: 92 mg/dL (ref 70–99)
Potassium: 5 mEq/L (ref 3.5–5.3)
Sodium: 140 mEq/L (ref 135–145)

## 2014-07-16 ENCOUNTER — Ambulatory Visit: Payer: Medicare Other | Admitting: Physical Therapy

## 2014-07-16 DIAGNOSIS — B353 Tinea pedis: Secondary | ICD-10-CM | POA: Diagnosis not present

## 2014-07-16 DIAGNOSIS — I8310 Varicose veins of unspecified lower extremity with inflammation: Secondary | ICD-10-CM | POA: Diagnosis not present

## 2014-07-16 DIAGNOSIS — L821 Other seborrheic keratosis: Secondary | ICD-10-CM | POA: Diagnosis not present

## 2014-07-16 DIAGNOSIS — IMO0001 Reserved for inherently not codable concepts without codable children: Secondary | ICD-10-CM | POA: Diagnosis not present

## 2014-07-16 DIAGNOSIS — L738 Other specified follicular disorders: Secondary | ICD-10-CM | POA: Diagnosis not present

## 2014-07-17 ENCOUNTER — Ambulatory Visit: Payer: Medicare Other

## 2014-07-17 DIAGNOSIS — R339 Retention of urine, unspecified: Secondary | ICD-10-CM | POA: Diagnosis not present

## 2014-07-17 DIAGNOSIS — I10 Essential (primary) hypertension: Secondary | ICD-10-CM | POA: Diagnosis not present

## 2014-07-17 DIAGNOSIS — Z125 Encounter for screening for malignant neoplasm of prostate: Secondary | ICD-10-CM | POA: Diagnosis not present

## 2014-07-17 DIAGNOSIS — Z0000 Encounter for general adult medical examination without abnormal findings: Secondary | ICD-10-CM | POA: Diagnosis not present

## 2014-07-17 DIAGNOSIS — R3989 Other symptoms and signs involving the genitourinary system: Secondary | ICD-10-CM | POA: Diagnosis not present

## 2014-07-17 DIAGNOSIS — Z136 Encounter for screening for cardiovascular disorders: Secondary | ICD-10-CM | POA: Diagnosis not present

## 2014-07-17 DIAGNOSIS — I5030 Unspecified diastolic (congestive) heart failure: Secondary | ICD-10-CM | POA: Diagnosis not present

## 2014-07-21 ENCOUNTER — Ambulatory Visit: Payer: Medicare Other | Admitting: Physical Therapy

## 2014-07-21 DIAGNOSIS — IMO0001 Reserved for inherently not codable concepts without codable children: Secondary | ICD-10-CM | POA: Diagnosis not present

## 2014-07-22 ENCOUNTER — Ambulatory Visit: Payer: Medicare Other | Admitting: Physical Therapy

## 2014-07-28 ENCOUNTER — Ambulatory Visit: Payer: Medicare Other | Admitting: Physical Therapy

## 2014-07-28 DIAGNOSIS — IMO0001 Reserved for inherently not codable concepts without codable children: Secondary | ICD-10-CM | POA: Diagnosis not present

## 2014-07-30 ENCOUNTER — Ambulatory Visit: Payer: Medicare Other | Admitting: Physical Therapy

## 2014-07-30 DIAGNOSIS — IMO0001 Reserved for inherently not codable concepts without codable children: Secondary | ICD-10-CM | POA: Diagnosis not present

## 2014-08-04 ENCOUNTER — Ambulatory Visit: Payer: Medicare Other | Admitting: Physical Therapy

## 2014-08-04 DIAGNOSIS — Z96649 Presence of unspecified artificial hip joint: Secondary | ICD-10-CM | POA: Diagnosis not present

## 2014-08-06 ENCOUNTER — Ambulatory Visit: Payer: Medicare Other | Admitting: Physical Therapy

## 2014-08-06 DIAGNOSIS — IMO0001 Reserved for inherently not codable concepts without codable children: Secondary | ICD-10-CM | POA: Diagnosis not present

## 2014-08-11 ENCOUNTER — Ambulatory Visit: Payer: Medicare Other | Attending: Family Medicine | Admitting: Physical Therapy

## 2014-08-11 DIAGNOSIS — I129 Hypertensive chronic kidney disease with stage 1 through stage 4 chronic kidney disease, or unspecified chronic kidney disease: Secondary | ICD-10-CM | POA: Diagnosis not present

## 2014-08-11 DIAGNOSIS — R262 Difficulty in walking, not elsewhere classified: Secondary | ICD-10-CM | POA: Diagnosis not present

## 2014-08-11 DIAGNOSIS — Z96641 Presence of right artificial hip joint: Secondary | ICD-10-CM | POA: Diagnosis not present

## 2014-08-11 DIAGNOSIS — R5381 Other malaise: Secondary | ICD-10-CM | POA: Diagnosis not present

## 2014-08-11 DIAGNOSIS — M6281 Muscle weakness (generalized): Secondary | ICD-10-CM | POA: Insufficient documentation

## 2014-08-11 DIAGNOSIS — N19 Unspecified kidney failure: Secondary | ICD-10-CM | POA: Insufficient documentation

## 2014-08-11 DIAGNOSIS — R269 Unspecified abnormalities of gait and mobility: Secondary | ICD-10-CM | POA: Diagnosis not present

## 2014-08-11 DIAGNOSIS — E119 Type 2 diabetes mellitus without complications: Secondary | ICD-10-CM | POA: Insufficient documentation

## 2014-08-11 DIAGNOSIS — Z5189 Encounter for other specified aftercare: Secondary | ICD-10-CM | POA: Diagnosis not present

## 2014-08-14 ENCOUNTER — Encounter: Payer: Self-pay | Admitting: Cardiology

## 2014-08-14 ENCOUNTER — Ambulatory Visit (INDEPENDENT_AMBULATORY_CARE_PROVIDER_SITE_OTHER): Payer: Medicare Other | Admitting: Cardiology

## 2014-08-14 VITALS — BP 106/63 | HR 86 | Ht 72.0 in | Wt 256.4 lb

## 2014-08-14 DIAGNOSIS — I10 Essential (primary) hypertension: Secondary | ICD-10-CM | POA: Diagnosis not present

## 2014-08-14 DIAGNOSIS — I5031 Acute diastolic (congestive) heart failure: Secondary | ICD-10-CM

## 2014-08-14 MED ORDER — FUROSEMIDE 40 MG PO TABS: 80.0000 mg | ORAL_TABLET | Freq: Every day | ORAL | Status: DC

## 2014-08-14 MED ORDER — ASPIRIN 81 MG PO TABS: 81.0000 mg | ORAL_TABLET | Freq: Every day | ORAL | Status: DC

## 2014-08-14 NOTE — Patient Instructions (Signed)
Your physician wants you to follow-up in: [place] will receive a reminder letter in the mail two months in advance. If you don't receive a letter, please call our office to schedule the follow-up appointment.   INCREASE FUROSEMIDE TO 80 MG ONCE DAILY X 3 DAYS THEN REDUCE BACK TO 40 MG ONCE DAILY  TAKE EXTRA 40 MG OF FUROSEMIDE FOR WEIGHT GAIN  Your physician recommends that you return for lab work in: [place]

## 2014-08-14 NOTE — Assessment & Plan Note (Signed)
Blood pressure controlled. Continue present medications. 

## 2014-08-14 NOTE — Progress Notes (Signed)
HPI: FU diastolic CHF/edema. Had right hip arthroplasty 05/22/14. Lasix held and pt hydrated. Readmitted with volume overload. CR 0.88 to 3.78. Troponin normal. ProBNP 115. RLE doppler showed no DVT. Echo showed normal LV function, grade 1 diastolic dysfunction; mild LAE. Renal ultrasound negative. VQ very low prob. Patient treated with lasix and volume excess, renal insuff improved. Note patient readmitted again due to inability to void and CR again increased 1.05 to 2.43. Improved following foley placement. Patient following discharge was initially treated with an indwelling Foley catheter. He has seen urology and is now undergoing in and out catheterizations. When I saw him in 8/15, I felt problem was predominantly urinary retention. Since then His edema has slowly improved but persists. He is now urinating much better. There is no chest pain or syncope. Mild dyspnea on exertion.   Current Outpatient Prescriptions  Medication Sig Dispense Refill  . ammonium lactate (LAC-HYDRIN) 12 % lotion Apply 1 application topically 3 (three) times daily as needed for dry skin.      Marland Kitchen aspirin 325 MG tablet Take 325 mg by mouth 2 (two) times daily.      . beta carotene w/minerals (OCUVITE) tablet Take 1 tablet by mouth daily.      . ferrous gluconate (FERGON) 324 MG tablet Take 324 mg by mouth 2 (two) times daily with a meal.       . furosemide (LASIX) 40 MG tablet Take 40 mg by mouth daily. May add extra 20 mg , for weight gain      . glucosamine-chondroitin 500-400 MG tablet Take 1 tablet by mouth 2 (two) times daily.      Marland Kitchen losartan (COZAAR) 50 MG tablet Take 50 mg by mouth 2 (two) times daily.      . methocarbamol (ROBAXIN) 500 MG tablet Take 1 tablet (500 mg total) by mouth every 6 (six) hours as needed for muscle spasms.  40 tablet  1  . oxyCODONE-acetaminophen (PERCOCET/ROXICET) 5-325 MG per tablet Take 1 tablet by mouth every 4 (four) hours as needed for moderate pain or severe pain.      .  potassium chloride SA (K-DUR,KLOR-CON) 20 MEQ tablet Take 1 tablet daily and take an extra dose if you take extra lasix.  60 tablet  1  . Saw [place]metto 160 MG CAPS Take 160 mg by mouth daily.      . tamsulosin (FLOMAX) 0.4 MG CAPS capsule Take 0.4 mg by mouth daily.        No current facility-administered medications for this visit.     Past Medical History  Diagnosis Date  . Arthritis     BIL KNEES  . Eczema   . History of kidney stones   . Adrenal gland cyst   . Herpesviral conjunctivitis     hx of in past causing need for corneal transplant rt eye  . HTN (hypertension) 05/27/2014  . Diastolic heart failure, NYHA class 2     Past Surgical History  Procedure Laterality Date  . Pilonidal cyst excision  1974  . Corneal transplant  1986  . Hernia repair  1981 /2001 /2012    L ING HERNIA REPAIR X 6  . Total hip arthroplasty Right 05/22/2014    Procedure: RIGHT TOTAL HIP ARTHROPLASTY;  Surgeon: Tobi Bastos, MD;  Location: [facility];  Service: Orthopedics;  Laterality: Right;  . Tonsillectomy      History   Social History  . Marital Status: Married    Spouse  Name: N/A    Number of Children: 2  . Years of Education: N/A   Occupational History  .     Social History Main Topics  . Smoking status: Former Smoker    Quit date: 05/17/2011  . Smokeless tobacco: Never Used  . Alcohol Use: Yes     Comment: OCCASIONAL  . Drug Use: No  . Sexual Activity: No   Other Topics Concern  . Not on file   Social History Narrative  . No narrative on file    ROS: no fevers or chills, productive cough, hemoptysis, dysphasia, odynophagia, melena, hematochezia, dysuria, hematuria, rash, seizure activity, orthopnea, PND, pedal edema, claudication. Remaining systems are negative.  Physical Exam: Well-developed well-nourished in no acute distress.  Skin is warm and dry.  HEENT is normal.  Neck is supple.  Chest is clear to auscultation with normal expansion.  Cardiovascular exam is  regular rate and rhythm.  Abdominal exam nontender or distended. No masses palpated. Extremities show 1+ edema. neuro grossly intact  ECG Sinus rhythm at a rate of 89. No ST changes.

## 2014-08-14 NOTE — Assessment & Plan Note (Signed)
Patient is now urinating. His edema is slowly improving. I think his volume excess was related to urinary retention, excess volume given postoperatively superimposed on diastolic dysfunction. He continues with 1+ edema. Increase Lasix to 80 mg daily for 3 days and then resume 40 mg daily. Take an additional 40 mg daily for weight gain. Patient instructed on low sodium diet and fluid restriction. In one week we will check potassium, renal function and BNP. Note LV function is normal. When he improves we will plan a functional study for risk stratification.

## 2014-09-01 DIAGNOSIS — N401 Enlarged prostate with lower urinary tract symptoms: Secondary | ICD-10-CM | POA: Diagnosis not present

## 2014-09-01 DIAGNOSIS — R338 Other retention of urine: Secondary | ICD-10-CM | POA: Diagnosis not present

## 2014-09-01 DIAGNOSIS — N9989 Other postprocedural complications and disorders of genitourinary system: Secondary | ICD-10-CM | POA: Diagnosis not present

## 2014-09-17 DIAGNOSIS — I10 Essential (primary) hypertension: Secondary | ICD-10-CM | POA: Diagnosis not present

## 2014-09-17 DIAGNOSIS — D649 Anemia, unspecified: Secondary | ICD-10-CM | POA: Diagnosis not present

## 2014-09-17 DIAGNOSIS — I5030 Unspecified diastolic (congestive) heart failure: Secondary | ICD-10-CM | POA: Diagnosis not present

## 2014-09-17 DIAGNOSIS — R339 Retention of urine, unspecified: Secondary | ICD-10-CM | POA: Diagnosis not present

## 2014-09-17 DIAGNOSIS — E669 Obesity, unspecified: Secondary | ICD-10-CM | POA: Diagnosis not present

## 2014-12-08 DIAGNOSIS — Z9641 Presence of insulin pump (external) (internal): Secondary | ICD-10-CM | POA: Diagnosis not present

## 2014-12-08 DIAGNOSIS — Z471 Aftercare following joint replacement surgery: Secondary | ICD-10-CM | POA: Diagnosis not present

## 2015-01-02 DIAGNOSIS — I10 Essential (primary) hypertension: Secondary | ICD-10-CM | POA: Diagnosis not present

## 2015-01-02 DIAGNOSIS — I5031 Acute diastolic (congestive) heart failure: Secondary | ICD-10-CM | POA: Diagnosis not present

## 2015-01-03 LAB — BASIC METABOLIC PANEL WITH GFR
BUN: 31 mg/dL — ABNORMAL HIGH (ref 6–23)
CO2: 26 mEq/L (ref 19–32)
Calcium: 9.5 mg/dL (ref 8.4–10.5)
Chloride: 105 mEq/L (ref 96–112)
Creat: 1.03 mg/dL (ref 0.50–1.35)
GFR, Est African American: 85 mL/min
GFR, Est Non African American: 74 mL/min
Glucose, Bld: 94 mg/dL (ref 70–99)
Potassium: 4.7 mEq/L (ref 3.5–5.3)
Sodium: 138 mEq/L (ref 135–145)

## 2015-01-03 LAB — BRAIN NATRIURETIC PEPTIDE: Brain Natriuretic Peptide: <2 pg/mL (ref 0.0–100.0)

## 2015-01-08 ENCOUNTER — Encounter: Payer: Self-pay | Admitting: Cardiology

## 2015-01-08 ENCOUNTER — Ambulatory Visit (INDEPENDENT_AMBULATORY_CARE_PROVIDER_SITE_OTHER): Payer: Medicare Other | Admitting: Cardiology

## 2015-01-08 VITALS — BP 120/60 | HR 60 | Ht 71.0 in | Wt 255.2 lb

## 2015-01-08 DIAGNOSIS — I10 Essential (primary) hypertension: Secondary | ICD-10-CM

## 2015-01-08 DIAGNOSIS — R0600 Dyspnea, unspecified: Secondary | ICD-10-CM

## 2015-01-08 DIAGNOSIS — R0602 Shortness of breath: Secondary | ICD-10-CM

## 2015-01-08 DIAGNOSIS — I5031 Acute diastolic (congestive) heart failure: Secondary | ICD-10-CM

## 2015-01-08 NOTE — Assessment & Plan Note (Signed)
Patient's volume status is improved. Previous volume excess felt secondary to a combination of hydration, renal insufficiency with decreased urine output and diastolic congestive heart failure. He is now passing his urine with no difficulties. Volume has improved. Continue Lasix 40 mg daily with additional 40 mg for weight gain of 2 pounds or excess edema. I will arrange an exercise treadmill given his history of dyspnea to screen for coronary disease.

## 2015-01-08 NOTE — Patient Instructions (Addendum)
Your physician wants you to follow-up in: [place] will receive a reminder letter in the mail two months in advance. If you don't receive a letter, please call our office to schedule the follow-up appointment.   Your physician has requested that you have an exercise tolerance test. For further information please visit [email or web address]. Please also follow instruction sheet, as given.   Exercise Stress Electrocardiogram An exercise stress electrocardiogram is a test to check how blood flows to your heart. It is done to find areas of poor blood flow. You will need to walk on a treadmill for this test. The electrocardiogram will record your heartbeat when you are at rest and when you are exercising. BEFORE THE PROCEDURE  Do not have drinks with caffeine or foods with caffeine for 24 hours before the test, or as told by your doctor. This includes coffee, tea (even decaf tea), sodas, chocolate, and cocoa.  Follow your doctor's instructions about eating and drinking before the test.  Ask your doctor what medicines you should or should not take before the test. Take your medicines with water unless told by your doctor not to.  If you use an inhaler, bring it with you to the test.  Bring a snack to eat after the test.  Do not  smoke for 4 hours before the test.  Do not put lotions, powders, creams, or oils on your chest before the test.  Wear comfortable shoes and clothing. PROCEDURE  You will have patches put on your chest. Small areas of your chest may need to be shaved. Wires will be connected to the patches.  Your heart rate will be watched while you are resting and while you are exercising.  You will walk on the treadmill. The treadmill will slowly get faster to raise your heart rate.  The test will take about 1-2 hours. AFTER THE PROCEDURE  Your heart rate and blood pressure will be watched after the test.  You may return to your normal diet, activities, and  medicines or as told by your doctor. Document Released: 04/11/2008 Document Revised: 03/10/2014 Document Reviewed: 07/01/2013 [facility] Patient Information 2015 [place], [place]. This information is not intended to replace advice given to you by your health care provider. Make sure you discuss any questions you have with your health care provider.

## 2015-01-08 NOTE — Progress Notes (Signed)
HPI: FU diastolic CHF/edema. Had right hip arthroplasty 05/22/14. Lasix held and pt hydrated. Readmitted with volume overload. CR 0.88 to 3.78. Troponin normal. ProBNP 115. RLE doppler showed no DVT. Echo showed normal LV function, grade 1 diastolic dysfunction; mild LAE. Renal ultrasound negative. VQ very low prob. Patient treated with lasix and volume excess, renal insuff improved. Note patient readmitted again due to inability to void and CR again increased 1.05 to 2.43. Improved following foley placement. Patient following discharge was initially treated with an indwelling Foley catheter. Since I last saw him, he denies dyspnea, chest pain, palpitations or syncope. He has occasional mild pedal edema but much improved.  Current Outpatient Prescriptions  Medication Sig Dispense Refill  . aspirin 81 MG tablet Take 1 tablet (81 mg total) by mouth daily.    . beta carotene w/minerals (OCUVITE) tablet Take 1 tablet by mouth daily.    . furosemide (LASIX) 40 MG tablet Take 2 tablets (80 mg total) by mouth daily. May add extra 40 mg , for weight gain 90 tablet 6  . losartan (COZAAR) 50 MG tablet Take 50 mg by mouth 2 (two) times daily.    . meloxicam (MOBIC) 15 MG tablet Take 1 tablet by mouth daily.    . potassium chloride SA (K-DUR,KLOR-CON) 20 MEQ tablet Take 1 tablet daily and take an extra dose if you take extra lasix. 60 tablet 1  . pravastatin (PRAVACHOL) 20 MG tablet Take 1 tablet by mouth daily.    . tamsulosin (FLOMAX) 0.4 MG CAPS capsule Take 0.4 mg by mouth daily.      No current facility-administered medications for this visit.     Past Medical History  Diagnosis Date  . Arthritis     BIL KNEES  . Eczema   . History of kidney stones   . Adrenal gland cyst   . Herpesviral conjunctivitis     hx of in past causing need for corneal transplant rt eye  . HTN (hypertension) 05/27/2014  . Diastolic heart failure, NYHA class 2     Past Surgical History  Procedure Laterality  Date  . Pilonidal cyst excision  1974  . Corneal transplant  1986  . Hernia repair  1981 /2001 /2012    L ING HERNIA REPAIR X 6  . Total hip arthroplasty Right 05/22/2014    Procedure: RIGHT TOTAL HIP ARTHROPLASTY;  Surgeon: Tobi Bastos, MD;  Location: [facility];  Service: Orthopedics;  Laterality: Right;  . Tonsillectomy      History   Social History  . Marital Status: Married    Spouse Name: N/A  . Number of Children: 2  . Years of Education: N/A   Occupational History  .     Social History Main Topics  . Smoking status: Former Smoker    Quit date: 05/17/2011  . Smokeless tobacco: Never Used  . Alcohol Use: Yes     Comment: OCCASIONAL  . Drug Use: No  . Sexual Activity: No   Other Topics Concern  . Not on file   Social History Narrative    ROS: arthralgias but no fevers or chills, productive cough, hemoptysis, dysphasia, odynophagia, melena, hematochezia, dysuria, hematuria, rash, seizure activity, orthopnea, PND, claudication. Remaining systems are negative.  Physical Exam: Well-developed well-nourished in no acute distress.  Skin is warm and dry.  HEENT is normal.  Neck is supple.  Chest is clear to auscultation with normal expansion.  Cardiovascular exam is regular rate and rhythm.  Abdominal exam nontender or distended. No masses palpated. Extremities show trace to 1+ edema. neuro grossly intact

## 2015-01-08 NOTE — Assessment & Plan Note (Signed)
Dyspnea much improved now that volume status improved. Arrange exercise treadmill for risk stratification.

## 2015-01-08 NOTE — Assessment & Plan Note (Signed)
Blood pressure controlled. Continue present medications. 

## 2015-02-04 ENCOUNTER — Telehealth (HOSPITAL_COMMUNITY): Payer: Self-pay

## 2015-02-04 NOTE — Telephone Encounter (Signed)
Encounter complete. 

## 2015-02-06 ENCOUNTER — Ambulatory Visit (HOSPITAL_COMMUNITY)
Admission: RE | Admit: 2015-02-06 | Discharge: 2015-02-06 | Disposition: A | Discharge status: Home / Self Care | Payer: Medicare Other | Source: Ambulatory Visit | Attending: Cardiovascular Disease | Admitting: Cardiovascular Disease

## 2015-02-06 DIAGNOSIS — R0600 Dyspnea, unspecified: Secondary | ICD-10-CM | POA: Diagnosis not present

## 2015-02-06 NOTE — Procedures (Signed)
Exercise Treadmill Test   Test  Exercise Tolerance Test Ordering MD: Kirk Ruths, MD  Interpreting MD:   Unique Test No: 1 Treadmill:  1  Indication for ETT: exertional dyspnea  Contraindication to ETT: No   Stress Modality: exercise - treadmill  Cardiac Imaging Performed: non   Protocol: standard [place] - maximal  Max BP:  178/61  Max MPHR (bpm):  151 85% MPR (bpm):  128  MPHR obtained (bpm):  144 % MPHR obtained:  95  Reached 85% MPHR (min:sec):  3:45 Total Exercise Time (min-sec):  6:00  Workload in METS:  7.00 Borg Scale: Not recorded  Reason ETT Terminated:  dyspnea    ST Segment Analysis At Rest: normal ST segments - no evidence of significant ST depression With Exercise: no evidence of significant ST depression  Other Information Arrhythmia:  No Angina during ETT:  absent (0) Quality of ETT:  diagnostic  ETT Interpretation:  normal - no evidence of ischemia by ST analysis  Comments: The patient had an moderate exercise tolerance.  There was no chest pain.  There was an appropriate level of dyspnea.  There were no arrhythmias, a normal heart rate response and normal BP response.  There were no ischemic ST T wave changes and a normal heart rate recovery.  [facility] treadmill score was -7  Recommendations: Negative adequate stress test.

## 2015-05-08 DIAGNOSIS — R609 Edema, unspecified: Secondary | ICD-10-CM | POA: Diagnosis not present

## 2015-07-21 DIAGNOSIS — D692 Other nonthrombocytopenic purpura: Secondary | ICD-10-CM | POA: Diagnosis not present

## 2015-07-21 DIAGNOSIS — Z125 Encounter for screening for malignant neoplasm of prostate: Secondary | ICD-10-CM | POA: Diagnosis not present

## 2015-07-21 DIAGNOSIS — Z136 Encounter for screening for cardiovascular disorders: Secondary | ICD-10-CM | POA: Diagnosis not present

## 2015-07-21 DIAGNOSIS — Z1211 Encounter for screening for malignant neoplasm of colon: Secondary | ICD-10-CM | POA: Diagnosis not present

## 2015-07-21 DIAGNOSIS — D649 Anemia, unspecified: Secondary | ICD-10-CM | POA: Diagnosis not present

## 2015-07-21 DIAGNOSIS — E785 Hyperlipidemia, unspecified: Secondary | ICD-10-CM | POA: Diagnosis not present

## 2015-07-21 DIAGNOSIS — Z0000 Encounter for general adult medical examination without abnormal findings: Secondary | ICD-10-CM | POA: Diagnosis not present

## 2015-07-21 DIAGNOSIS — Z122 Encounter for screening for malignant neoplasm of respiratory organs: Secondary | ICD-10-CM | POA: Diagnosis not present

## 2015-07-21 DIAGNOSIS — I5022 Chronic systolic (congestive) heart failure: Secondary | ICD-10-CM | POA: Diagnosis not present

## 2015-07-22 ENCOUNTER — Other Ambulatory Visit (HOSPITAL_COMMUNITY): Payer: Self-pay | Admitting: Family Medicine

## 2015-07-22 DIAGNOSIS — Z122 Encounter for screening for malignant neoplasm of respiratory organs: Secondary | ICD-10-CM

## 2015-07-23 ENCOUNTER — Other Ambulatory Visit (HOSPITAL_COMMUNITY): Payer: Self-pay | Admitting: Family Medicine

## 2015-07-23 DIAGNOSIS — F17211 Nicotine dependence, cigarettes, in remission: Secondary | ICD-10-CM

## 2015-07-27 ENCOUNTER — Other Ambulatory Visit: Payer: Self-pay | Admitting: Family Medicine

## 2015-07-27 DIAGNOSIS — Z139 Encounter for screening, unspecified: Secondary | ICD-10-CM

## 2015-07-30 ENCOUNTER — Ambulatory Visit (HOSPITAL_COMMUNITY): Payer: Medicare Other

## 2015-08-04 ENCOUNTER — Ambulatory Visit (HOSPITAL_COMMUNITY)
Admission: RE | Admit: 2015-08-04 | Discharge: 2015-08-04 | Disposition: A | Discharge status: Home / Self Care | Payer: Medicare Other | Source: Ambulatory Visit | Attending: Family Medicine | Admitting: Family Medicine

## 2015-08-04 DIAGNOSIS — R911 Solitary pulmonary nodule: Secondary | ICD-10-CM | POA: Diagnosis not present

## 2015-08-04 DIAGNOSIS — I700 Atherosclerosis of aorta: Secondary | ICD-10-CM | POA: Insufficient documentation

## 2015-08-04 DIAGNOSIS — F17211 Nicotine dependence, cigarettes, in remission: Secondary | ICD-10-CM

## 2015-08-04 DIAGNOSIS — J432 Centrilobular emphysema: Secondary | ICD-10-CM | POA: Insufficient documentation

## 2015-08-04 DIAGNOSIS — Z122 Encounter for screening for malignant neoplasm of respiratory organs: Secondary | ICD-10-CM | POA: Insufficient documentation

## 2015-08-04 DIAGNOSIS — Z87891 Personal history of nicotine dependence: Secondary | ICD-10-CM | POA: Insufficient documentation

## 2015-08-04 DIAGNOSIS — I2510 Atherosclerotic heart disease of native coronary artery without angina pectoris: Secondary | ICD-10-CM | POA: Diagnosis not present

## 2016-01-29 DIAGNOSIS — Z1159 Encounter for screening for other viral diseases: Secondary | ICD-10-CM | POA: Diagnosis not present

## 2016-01-29 DIAGNOSIS — I10 Essential (primary) hypertension: Secondary | ICD-10-CM | POA: Diagnosis not present

## 2016-01-29 DIAGNOSIS — E785 Hyperlipidemia, unspecified: Secondary | ICD-10-CM | POA: Diagnosis not present

## 2016-01-29 DIAGNOSIS — I5030 Unspecified diastolic (congestive) heart failure: Secondary | ICD-10-CM | POA: Diagnosis not present

## 2016-02-01 NOTE — Progress Notes (Signed)
HPI: FU diastolic CHF/edema. Had right hip arthroplasty 05/22/14. Lasix held and pt hydrated. Readmitted with volume overload. CR 0.88 to 3.78. Troponin normal. ProBNP 115. RLE doppler showed no DVT. Echo showed normal LV function, grade 1 diastolic dysfunction; mild LAE. Renal ultrasound negative. VQ very low prob. Patient treated with lasix and volume excess, renal insuff improved. Note patient readmitted again due to inability to void and CR again increased 1.05 to 2.43. Improved following foley placement. Patient following discharge was initially treated with an indwelling Foley catheter. Exercise treadmill April 2016 negative. Since I last saw him, the patient has dyspnea with more extreme activities but not with routine activities. It is relieved with rest. It is not associated with chest pain. There is no orthopnea, PND. There is no syncope or palpitations. There is no exertional chest pain. Occasional mild pedal edema.   Current Outpatient Prescriptions  Medication Sig Dispense Refill  . aspirin 81 MG tablet Take 1 tablet (81 mg total) by mouth daily.    . beta carotene w/minerals (OCUVITE) tablet Take 1 tablet by mouth daily.    . furosemide (LASIX) 40 MG tablet Take 2 tablets (80 mg total) by mouth daily. May add extra 40 mg , for weight gain 90 tablet 6  . losartan (COZAAR) 50 MG tablet Take 50 mg by mouth 2 (two) times daily.    . meloxicam (MOBIC) 15 MG tablet Take 1 tablet by mouth daily.    . potassium chloride SA (K-DUR,KLOR-CON) 20 MEQ tablet Take 1 tablet daily and take an extra dose if you take extra lasix. 60 tablet 1  . pravastatin (PRAVACHOL) 20 MG tablet Take 1 tablet by mouth daily.    . tamsulosin (FLOMAX) 0.4 MG CAPS capsule Take 0.4 mg by mouth daily.     . vitamin C (ASCORBIC ACID) 500 MG tablet Take 500 mg by mouth 2 (two) times daily.     No current facility-administered medications for this visit.     Past Medical History  Diagnosis Date  . Arthritis    BIL KNEES  . Eczema   . History of kidney stones   . Adrenal gland cyst ([place])   . Herpesviral conjunctivitis     hx of in past causing need for corneal transplant rt eye  . HTN (hypertension) 05/27/2014  . Diastolic heart failure, NYHA class 2 ([place])     Past Surgical History  Procedure Laterality Date  . Pilonidal cyst excision  1974  . Corneal transplant  1986  . Hernia repair  1981 /2001 /2012    L ING HERNIA REPAIR X 6  . Total hip arthroplasty Right 05/22/2014    Procedure: RIGHT TOTAL HIP ARTHROPLASTY;  Surgeon: Tobi Bastos, MD;  Location: [facility];  Service: Orthopedics;  Laterality: Right;  . Tonsillectomy      Social History   Social History  . Marital Status: Married    Spouse Name: N/A  . Number of Children: 2  . Years of Education: N/A   Occupational History  .     Social History Main Topics  . Smoking status: Former Smoker    Quit date: 05/17/2011  . Smokeless tobacco: Never Used  . Alcohol Use: Yes     Comment: OCCASIONAL  . Drug Use: No  . Sexual Activity: No   Other Topics Concern  . Not on file   Social History Narrative    Family History  Problem Relation Age of Onset  . Alzheimer's  disease Mother   . Cancer - Other Father     cancer of the uvula  . Lung cancer Other     ROS: no fevers or chills, productive cough, hemoptysis, dysphasia, odynophagia, melena, hematochezia, dysuria, hematuria, rash, seizure activity, orthopnea, PND, claudication. Remaining systems are negative.  Physical Exam: Well-developed well-nourished in no acute distress.  Skin is warm and dry.  HEENT is normal.  Neck is supple.  Chest is clear to auscultation with normal expansion.  Cardiovascular exam is regular rate and rhythm.  Abdominal exam nontender or distended. No masses palpated. Positive bruit Extremities show trace edema. neuro grossly intact  ECG Sinus bradycardia at a rate of 57. Normal axis. No ST changes.

## 2016-02-04 ENCOUNTER — Ambulatory Visit (INDEPENDENT_AMBULATORY_CARE_PROVIDER_SITE_OTHER): Payer: Medicare Other | Admitting: Cardiology

## 2016-02-04 ENCOUNTER — Encounter: Payer: Self-pay | Admitting: Cardiology

## 2016-02-04 VITALS — BP 112/50 | HR 56 | Ht 69.0 in | Wt 259.0 lb

## 2016-02-04 DIAGNOSIS — I10 Essential (primary) hypertension: Secondary | ICD-10-CM | POA: Diagnosis not present

## 2016-02-04 DIAGNOSIS — I5032 Chronic diastolic (congestive) heart failure: Secondary | ICD-10-CM

## 2016-02-04 DIAGNOSIS — R0989 Other specified symptoms and signs involving the circulatory and respiratory systems: Secondary | ICD-10-CM | POA: Diagnosis not present

## 2016-02-04 DIAGNOSIS — Z87891 Personal history of nicotine dependence: Secondary | ICD-10-CM | POA: Diagnosis not present

## 2016-02-04 NOTE — Assessment & Plan Note (Signed)
Schedule Abdominal ultrasound to exclude aneurysm. 

## 2016-02-04 NOTE — Assessment & Plan Note (Signed)
Blood pressure controlled. Continue present medications. 

## 2016-02-04 NOTE — Patient Instructions (Addendum)
Your physician has requested that you have an abdominal aorta duplex. During this test, an ultrasound is used to evaluate the aorta. Allow 30 minutes for this exam. Do not eat after midnight the day before and avoid carbonated beverages   Your physician wants you to follow-up in: [place] will receive a reminder letter in the mail two months in advance. If you don't receive a letter, please call our office to schedule the follow-up appointment.   If you need a refill on your cardiac medications before your next appointment, please call your pharmacy.

## 2016-02-04 NOTE — Assessment & Plan Note (Addendum)
Previous volume excess felt secondary to a combination of hydration, renal insufficiency with decreased urine output and diastolic congestive heart failure. He is doing well with volume now. Continue Lasix 40 mg daily with additional 40 mg for weight gain of 2 pounds or excess edema. Obtain most recent laboratories from primary care.

## 2016-02-15 ENCOUNTER — Ambulatory Visit (HOSPITAL_COMMUNITY)
Admission: RE | Admit: 2016-02-15 | Discharge: 2016-02-15 | Disposition: A | Discharge status: Home / Self Care | Payer: Medicare Other | Source: Ambulatory Visit | Attending: Cardiology | Admitting: Cardiology

## 2016-02-15 DIAGNOSIS — R0989 Other specified symptoms and signs involving the circulatory and respiratory systems: Secondary | ICD-10-CM | POA: Diagnosis not present

## 2016-02-15 DIAGNOSIS — Z87891 Personal history of nicotine dependence: Secondary | ICD-10-CM | POA: Diagnosis not present

## 2016-02-15 DIAGNOSIS — I770 Arteriovenous fistula, acquired: Secondary | ICD-10-CM | POA: Diagnosis not present

## 2016-02-15 DIAGNOSIS — I10 Essential (primary) hypertension: Secondary | ICD-10-CM | POA: Diagnosis not present

## 2016-02-15 DIAGNOSIS — I714 Abdominal aortic aneurysm, without rupture: Secondary | ICD-10-CM | POA: Diagnosis not present

## 2016-02-15 DIAGNOSIS — Z136 Encounter for screening for cardiovascular disorders: Secondary | ICD-10-CM | POA: Diagnosis not present

## 2016-02-18 ENCOUNTER — Other Ambulatory Visit: Payer: Self-pay | Admitting: *Deleted

## 2016-02-18 DIAGNOSIS — Z87891 Personal history of nicotine dependence: Secondary | ICD-10-CM

## 2016-02-18 DIAGNOSIS — I714 Abdominal aortic aneurysm, without rupture, unspecified: Secondary | ICD-10-CM

## 2016-02-18 DIAGNOSIS — R0989 Other specified symptoms and signs involving the circulatory and respiratory systems: Secondary | ICD-10-CM

## 2016-04-20 IMAGING — US US RENAL
1 series · 14 of 25 positions shown · non-contrast
Comparison: None.

CLINICAL DATA: Increased BUN and creatinine, acute renal failure

EXAM:
RENAL/URINARY TRACT ULTRASOUND COMPLETE

[Series 1: us renal · 0.28mm/px · 14 of 45 slices shown]
[im 1/45]
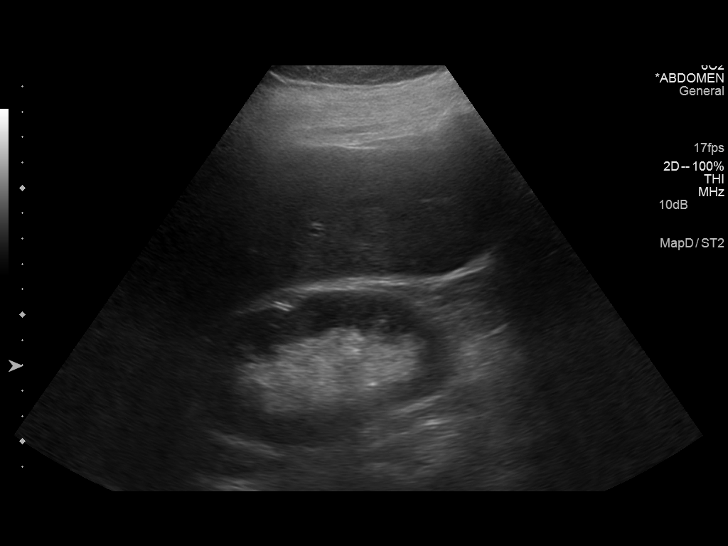
[im 4/45]
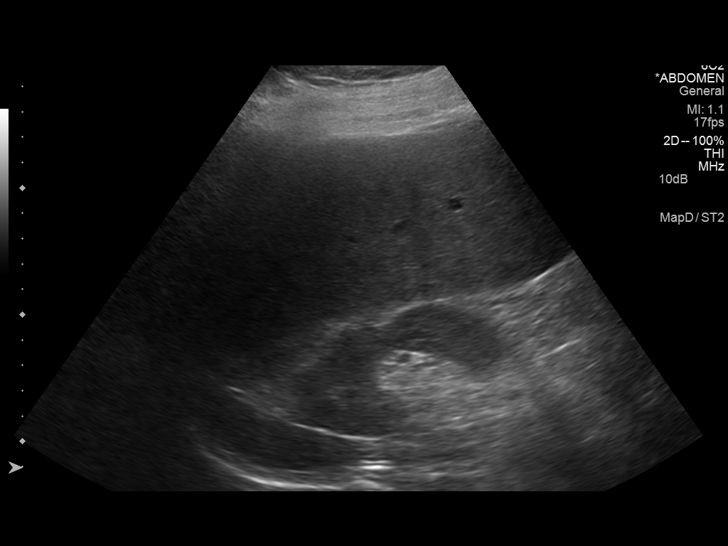
[im 8/45]
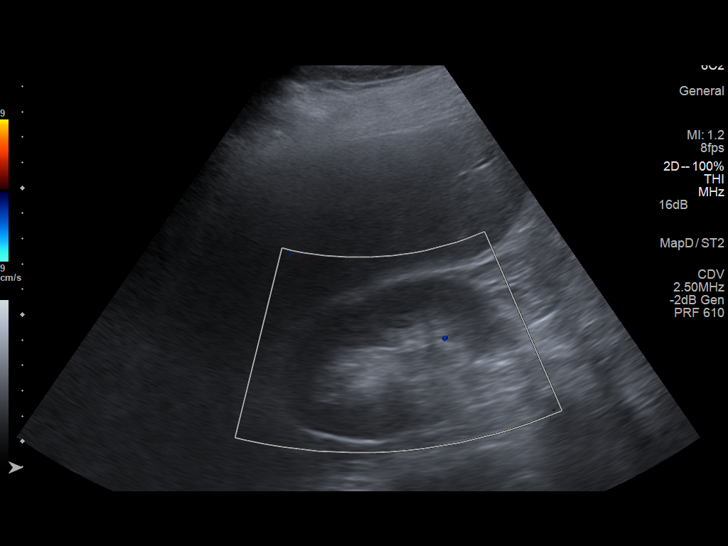
[im 12/45]
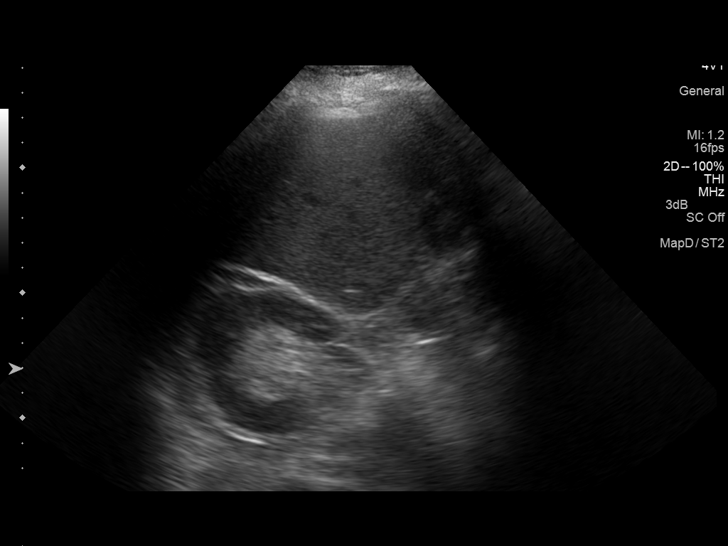
[im 15/45]
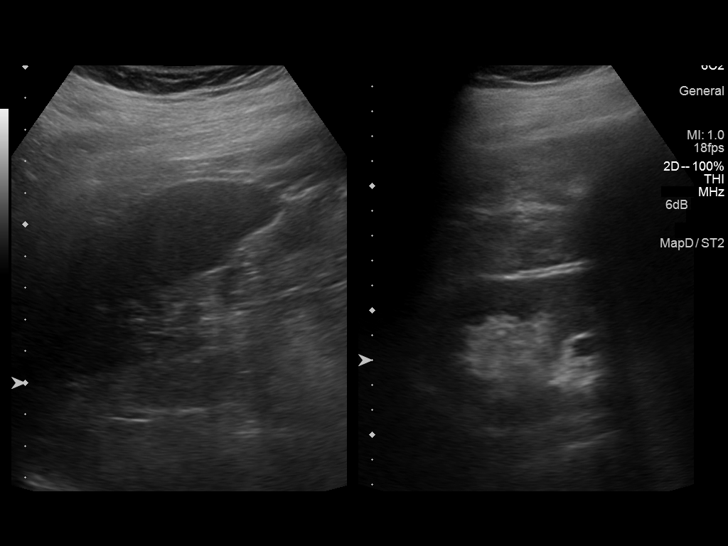
[im 17/45]
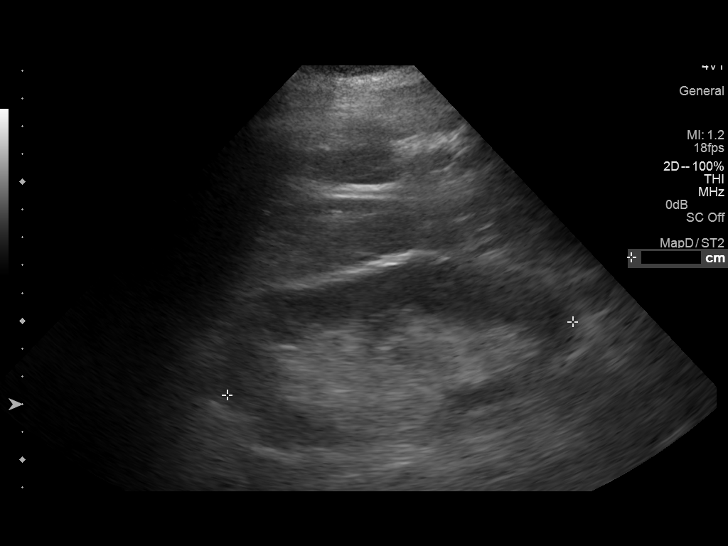
[im 21/45]
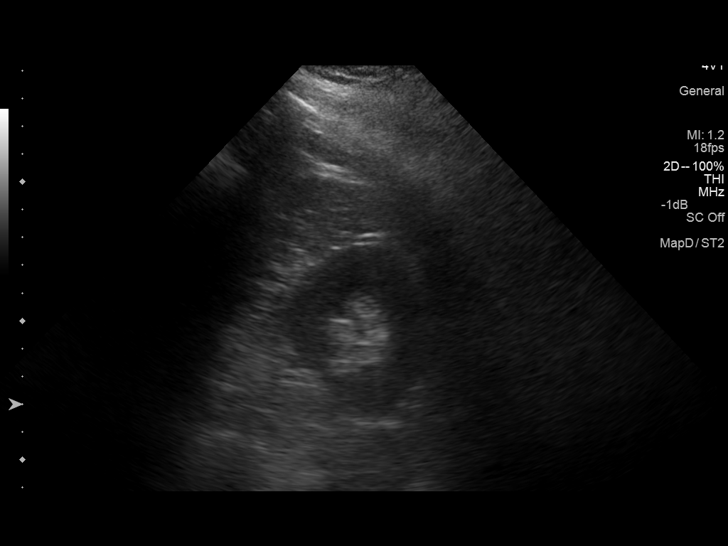
[im 24/45]
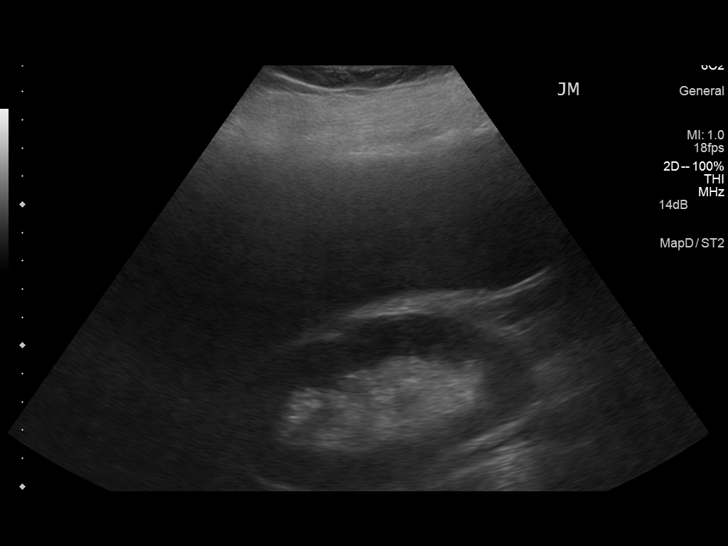
[im 28/45]
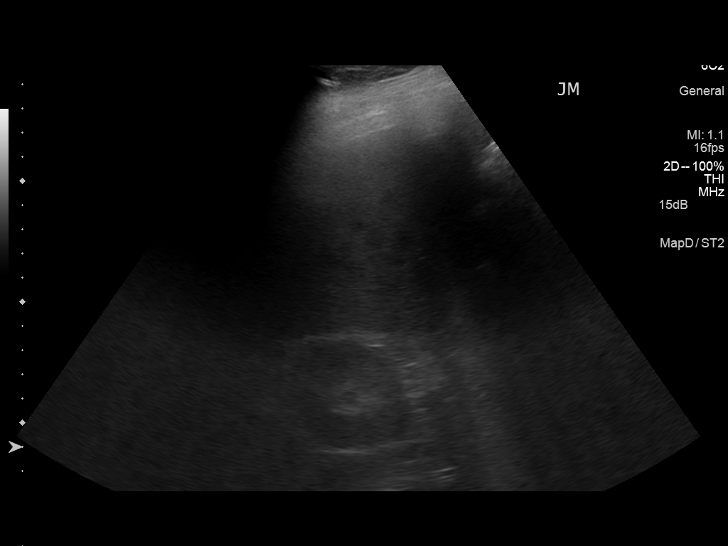
[im 30/45]
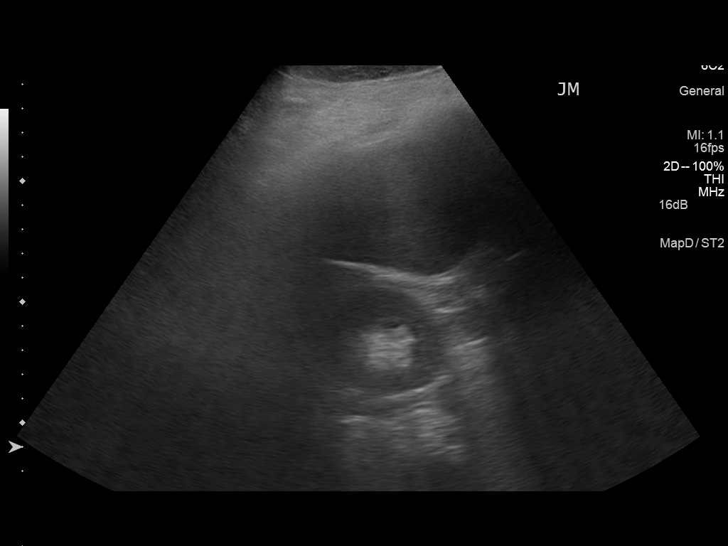
[im 34/45]
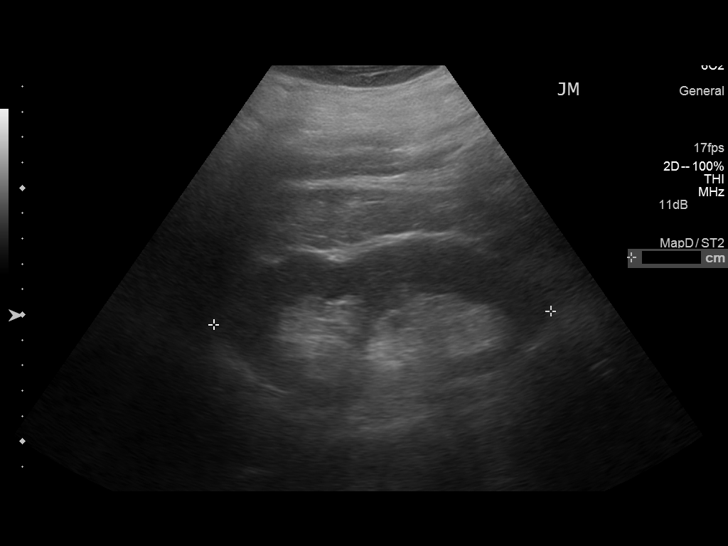
[im 37/45]
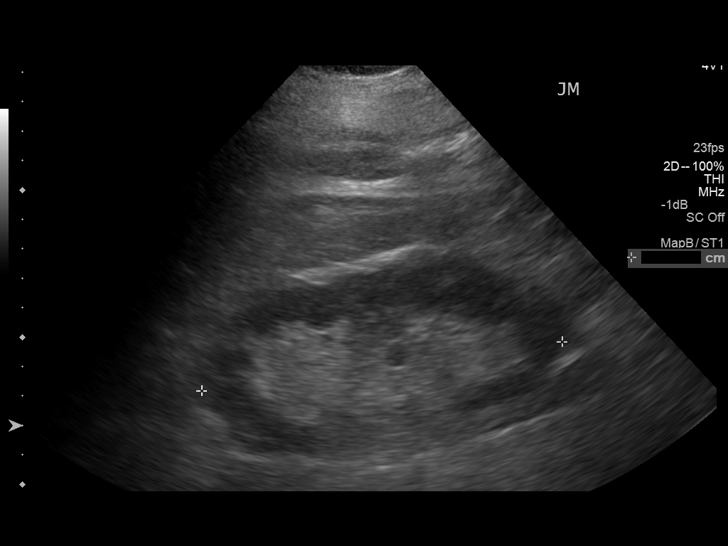
[im 41/45]
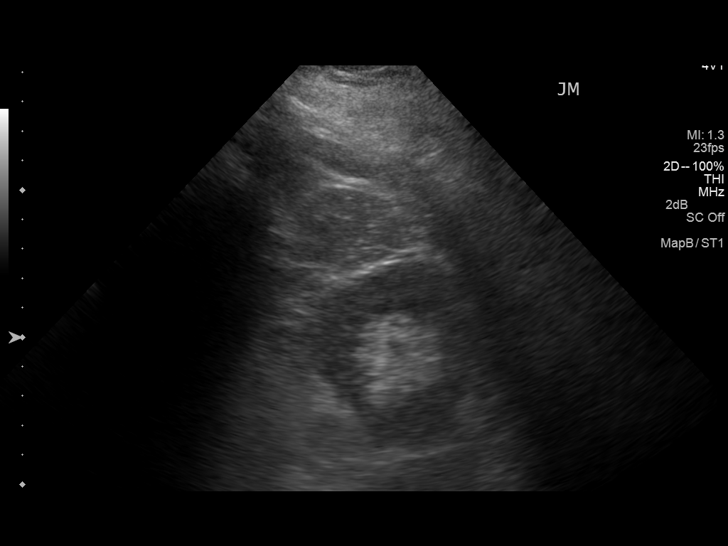
[im 45/45]
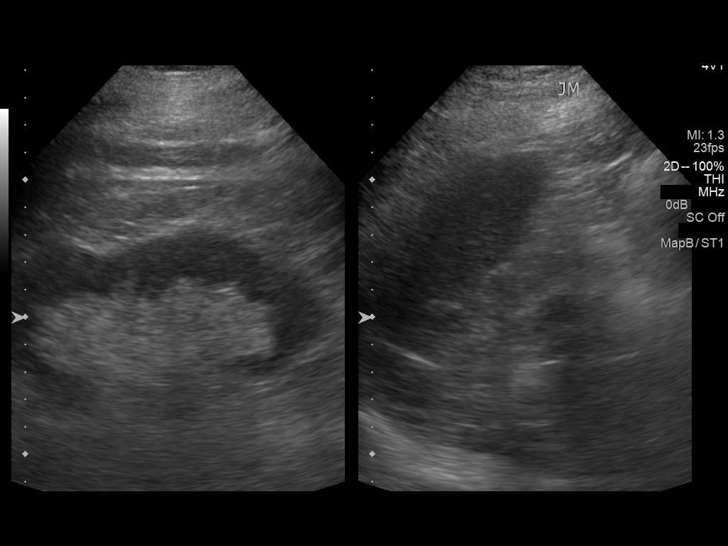

[14 of 25 positions shown; findings below may reference images not displayed]

FINDINGS: Right Kidney:

Length: 11.1. Echogenicity within normal limits. No mass or
hydronephrosis visualized.

Left Kidney:

Length: 13.3. Echogenicity within normal limits. No mass or
hydronephrosis visualized.

Bladder:

Non visualized due to Foley catheter.
IMPRESSION: 1. No hydronephrosis or diagnostic renal calculus.

## 2016-08-11 DIAGNOSIS — E785 Hyperlipidemia, unspecified: Secondary | ICD-10-CM | POA: Diagnosis not present

## 2016-08-11 DIAGNOSIS — D649 Anemia, unspecified: Secondary | ICD-10-CM | POA: Diagnosis not present

## 2016-08-12 DIAGNOSIS — Z0000 Encounter for general adult medical examination without abnormal findings: Secondary | ICD-10-CM | POA: Diagnosis not present

## 2016-08-12 DIAGNOSIS — I700 Atherosclerosis of aorta: Secondary | ICD-10-CM | POA: Diagnosis not present

## 2016-08-12 DIAGNOSIS — E785 Hyperlipidemia, unspecified: Secondary | ICD-10-CM | POA: Diagnosis not present

## 2016-11-28 DIAGNOSIS — Z23 Encounter for immunization: Secondary | ICD-10-CM | POA: Diagnosis not present

## 2017-02-27 DIAGNOSIS — S76311A Strain of muscle, fascia and tendon of the posterior muscle group at thigh level, right thigh, initial encounter: Secondary | ICD-10-CM | POA: Diagnosis not present

## 2017-03-01 ENCOUNTER — Ambulatory Visit (HOSPITAL_COMMUNITY)
Admission: RE | Admit: 2017-03-01 | Discharge: 2017-03-01 | Disposition: A | Discharge status: Home / Self Care | Payer: Medicare Other | Source: Ambulatory Visit | Attending: Cardiovascular Disease | Admitting: Cardiovascular Disease

## 2017-03-01 DIAGNOSIS — I714 Abdominal aortic aneurysm, without rupture, unspecified: Secondary | ICD-10-CM

## 2017-03-01 DIAGNOSIS — Z87891 Personal history of nicotine dependence: Secondary | ICD-10-CM

## 2017-03-01 DIAGNOSIS — R0989 Other specified symptoms and signs involving the circulatory and respiratory systems: Secondary | ICD-10-CM

## 2017-03-01 DIAGNOSIS — I700 Atherosclerosis of aorta: Secondary | ICD-10-CM | POA: Diagnosis not present

## 2017-03-29 ENCOUNTER — Ambulatory Visit (INDEPENDENT_AMBULATORY_CARE_PROVIDER_SITE_OTHER): Payer: Medicare Other | Admitting: Physician Assistant

## 2017-03-29 VITALS — BP 111/64 | HR 71 | Ht 72.0 in | Wt 261.0 lb

## 2017-03-29 DIAGNOSIS — I10 Essential (primary) hypertension: Secondary | ICD-10-CM | POA: Diagnosis not present

## 2017-03-29 DIAGNOSIS — Z8679 Personal history of other diseases of the circulatory system: Secondary | ICD-10-CM | POA: Diagnosis not present

## 2017-03-29 DIAGNOSIS — I5032 Chronic diastolic (congestive) heart failure: Secondary | ICD-10-CM

## 2017-03-29 NOTE — Patient Instructions (Signed)
Medication Instructions:  Your physician recommends that you continue on your current medications as directed. Please refer to the Current Medication list given to you today.  If you need a refill on your cardiac medications before your next appointment, please call your pharmacy.  Follow-Up: Your physician wants you to follow-up in: AFTER [place] IN April 2019 WITH DR Trellis Moment will receive a reminder letter in the mail two months in advance. If you don't receive a letter, please call our office AFTER [place] to schedule the DR [place] follow-up appointment.  Thank you for choosing [facility]!!

## 2017-03-29 NOTE — Progress Notes (Signed)
Cardiology Office Note    Date:  03/30/2017   ID:  Wilber Fini, DOB 1946/07/09, MRN [id number]  PCP:  Maurice Small, MD  Cardiologist:  Dr. Stanford Breed   Chief Complaint  Patient presents with  . Follow-up    seen for Dr. Stanford Breed. Annual visit    History of Present Illness:  [name] [name] is a 71 y.o. male with PMH of HTN, h/o kidney stones, and chronic diastolic HF. He underwent right hip arthroplasty on 05/22/2614. Lasix was held and he was hydrated. However he was readmitted with volume overload. Lower extremity venous Doppler obtained on 05/27/2014 was negative for DVT. VQ scan was negative. Echocardiogram obtained on 05/28/2014 showed EF 27-06%, grade 1 diastolic dysfunction, no regional wall motion abnormality. Renal ultrasound obtained on the same day showed no hydronephrosis or diagnostic renal calculus. He initially had inability to void and the creatinine went up to greater than 2, however symptoms improved after Foley placement. Exercise treadmill obtained in April 2016 was negative. His last office visit with Dr. Stanford Breed was on 02/04/2016, he was doing well at the time. He was found to have a infrarenal fusiform AAA on subsequent Doppler. Repeat Doppler obtained on 03/01/2017 showed increasing in the size of infrarenal fusiform AAA measuring 4.1 x 3.8 cm. Bilateral common iliac aneurysms. One year follow-up study was recommended.  He has been doing well since his last office visit. He denies any significant chest discomfort or shortness of breath. We will request lab work including lipid panel basic metabolic panel and a CBC from his primary care physician's office. Otherwise he seems to be euvolemic on physical exam. He denies any recent lower extremity edema, orthopnea or paroxysmal nocturnal dyspnea. He can follow-up in 1 year with repeat AAA doppler.    Past Medical History:  Diagnosis Date  . 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 Arthritis    BIL KNEES  . Diastolic heart failure, NYHA  class 2 ([place])   . Eczema   . Herpesviral conjunctivitis    hx of in past causing need for corneal transplant rt eye  . History of kidney stones   . HTN (hypertension) 05/27/2014    Past Surgical History:  Procedure Laterality Date  . CORNEAL TRANSPLANT  1986  . HERNIA REPAIR  1981 /2001 /2012   L ING HERNIA REPAIR X 6  . PILONIDAL CYST EXCISION  1974  . TONSILLECTOMY    . TOTAL HIP ARTHROPLASTY Right 05/22/2014   Procedure: RIGHT TOTAL HIP ARTHROPLASTY;  Surgeon: Tobi Bastos, MD;  Location: [facility];  Service: Orthopedics;  Laterality: Right;    Current Medications: Outpatient Medications Prior to Visit  Medication Sig Dispense Refill  . aspirin 81 MG tablet Take 1 tablet (81 mg total) by mouth daily.    . beta carotene w/minerals (OCUVITE) tablet Take 1 tablet by mouth daily.    . furosemide (LASIX) 40 MG tablet Take 2 tablets (80 mg total) by mouth daily. May add extra 40 mg , for weight gain 90 tablet 6  . losartan (COZAAR) 50 MG tablet Take 50 mg by mouth 2 (two) times daily.    . meloxicam (MOBIC) 15 MG tablet Take 1 tablet by mouth daily.    . potassium chloride SA (K-DUR,KLOR-CON) 20 MEQ tablet Take 1 tablet daily and take an extra dose if you take extra lasix. 60 tablet 1  . pravastatin (PRAVACHOL) 20 MG tablet Take 1 tablet by mouth daily.    . tamsulosin (FLOMAX) 0.4 MG  CAPS capsule Take 0.4 mg by mouth daily.     . vitamin C (ASCORBIC ACID) 500 MG tablet Take 500 mg by mouth 2 (two) times daily.     No facility-administered medications prior to visit.      Allergies:   Prednisone and Penicillins   Social History   Social History  . Marital status: Married    Spouse name: N/A  . Number of children: 2  . Years of education: N/A   Occupational History  .  [employer]   Social History Main Topics  . Smoking status: Former Smoker    Quit date: 05/17/2011  . Smokeless tobacco: Never Used  . Alcohol use Yes     Comment: OCCASIONAL  . Drug use: No   . Sexual activity: No   Other Topics Concern  . None   Social History Narrative  . None     Family History:  The patient's family history includes Alzheimer's disease in his mother; Cancer - Other in his father; Lung cancer in his other.   ROS:   Please see the history of present illness.    ROS All other systems reviewed and are negative.   PHYSICAL EXAM:   VS:  BP 111/64   Pulse 71   Ht 6' (1.829 m)   Wt 261 lb (118.4 kg)   BMI 35.40 kg/m    GEN: Well nourished, well developed, in no acute distress  HEENT: normal  Neck: no JVD, carotid bruits, or masses Cardiac: RRR; no murmurs, rubs, or gallops. Trace bilateral ankle edema  Respiratory:  clear to auscultation bilaterally, normal work of breathing GI: soft, nontender, nondistended, + BS MS: no deformity or atrophy  Skin: warm and dry, no rash Neuro:  Alert and Oriented x 3, Strength and sensation are intact Psych: euthymic mood, full affect  Wt Readings from Last 3 Encounters:  03/29/17 261 lb (118.4 kg)  02/04/16 259 lb (117.5 kg)  01/08/15 255 lb 3.2 oz (115.8 kg)      Studies/Labs Reviewed:   EKG:  EKG is ordered today.  The ekg ordered today demonstrates Sinus rhythm, heart rate 71, no obvious ST-T wave changes.  Recent Labs: No results found for requested labs within last 8760 hours.   Lipid Panel No results found for: CHOL, TRIG, HDL, CHOLHDL, VLDL, LDLCALC, LDLDIRECT  Additional studies/ records that were reviewed today include:   Echo 05/28/2014 LV EF: 55% -  60%  - Left ventricle: The cavity size was normal. Systolic function was normal. The estimated ejection fraction was in the range of 55% to 60%. Wall motion was normal; there were no regional wall motion abnormalities. There was an increased relative contribution of atrial contraction to ventricular filling. Doppler parameters are consistent with abnormal left ventricular relaxation (grade 1 diastolic dysfunction). - Left  atrium: The atrium was mildly dilated.   ASSESSMENT:    1. Chronic diastolic heart failure ([place])   2. History of abdominal aortic aneurysm (AAA)   3. Essential hypertension      PLAN:  In order of problems listed above:  1. Chronic diastolic heart failure: Euvolemic on physical exam. On Lasix 40 mg daily, may take an extra 40 mg as needed for weight gain. This dose has been working very well for him. He has only mild trace ankle edema which worsens throughout the day. I recommended conservative management with leg elevation at night.  2. AAA: Did have some increase in the size of his infrarenal AAA  on the recent Doppler, largest diameter 4.1 cm. Will need annual AAA Doppler prior to his next visit.  3. Hypertension: Blood pressure very well controlled on losartan.    Medication Adjustments/Labs and Tests Ordered: Current medicines are reviewed at length with the patient today.  Concerns regarding medicines are outlined above.  Medication changes, Labs and Tests ordered today are listed in the Patient Instructions below. Patient Instructions  Medication Instructions:  Your physician recommends that you continue on your current medications as directed. Please refer to the Current Medication list given to you today.  If you need a refill on your cardiac medications before your next appointment, please call your pharmacy.  Follow-Up: Your physician wants you to follow-up in: AFTER DOPPLER IN April 2019 WITH DR Trellis Moment will receive a reminder letter in the mail two months in advance. If you don't receive a letter, please call our office AFTER [place] to schedule the DR [place] follow-up appointment.  Thank you for choosing [facility] at [facility] [employer], [place]  03/30/2017 12:28 AM    [address], [place], [place]  [address] Phone: [phone]; Fax: [phone]

## 2017-03-30 ENCOUNTER — Encounter: Payer: Self-pay | Admitting: Physician Assistant

## 2017-05-15 DIAGNOSIS — H1711 Central corneal opacity, right eye: Secondary | ICD-10-CM | POA: Diagnosis not present

## 2017-06-19 DIAGNOSIS — I10 Essential (primary) hypertension: Secondary | ICD-10-CM | POA: Diagnosis not present

## 2017-06-19 DIAGNOSIS — E785 Hyperlipidemia, unspecified: Secondary | ICD-10-CM | POA: Diagnosis not present

## 2017-06-19 DIAGNOSIS — M160 Bilateral primary osteoarthritis of hip: Secondary | ICD-10-CM | POA: Diagnosis not present

## 2017-08-25 DIAGNOSIS — I714 Abdominal aortic aneurysm, without rupture: Secondary | ICD-10-CM | POA: Diagnosis not present

## 2017-08-25 DIAGNOSIS — M160 Bilateral primary osteoarthritis of hip: Secondary | ICD-10-CM | POA: Diagnosis not present

## 2017-08-25 DIAGNOSIS — Z0000 Encounter for general adult medical examination without abnormal findings: Secondary | ICD-10-CM | POA: Diagnosis not present

## 2017-08-25 DIAGNOSIS — Z136 Encounter for screening for cardiovascular disorders: Secondary | ICD-10-CM | POA: Diagnosis not present

## 2017-08-25 DIAGNOSIS — I10 Essential (primary) hypertension: Secondary | ICD-10-CM | POA: Diagnosis not present

## 2017-08-25 DIAGNOSIS — I700 Atherosclerosis of aorta: Secondary | ICD-10-CM | POA: Diagnosis not present

## 2017-08-25 DIAGNOSIS — Z125 Encounter for screening for malignant neoplasm of prostate: Secondary | ICD-10-CM | POA: Diagnosis not present

## 2017-08-25 DIAGNOSIS — R7303 Prediabetes: Secondary | ICD-10-CM | POA: Diagnosis not present

## 2017-08-25 DIAGNOSIS — R319 Hematuria, unspecified: Secondary | ICD-10-CM | POA: Diagnosis not present

## 2017-08-25 DIAGNOSIS — Z23 Encounter for immunization: Secondary | ICD-10-CM | POA: Diagnosis not present

## 2017-08-25 DIAGNOSIS — I5030 Unspecified diastolic (congestive) heart failure: Secondary | ICD-10-CM | POA: Diagnosis not present

## 2017-08-25 DIAGNOSIS — D649 Anemia, unspecified: Secondary | ICD-10-CM | POA: Diagnosis not present

## 2017-08-25 DIAGNOSIS — Z1211 Encounter for screening for malignant neoplasm of colon: Secondary | ICD-10-CM | POA: Diagnosis not present

## 2017-08-31 DIAGNOSIS — H903 Sensorineural hearing loss, bilateral: Secondary | ICD-10-CM | POA: Diagnosis not present

## 2017-09-22 DIAGNOSIS — R195 Other fecal abnormalities: Secondary | ICD-10-CM | POA: Diagnosis not present

## 2017-10-06 DIAGNOSIS — K640 First degree hemorrhoids: Secondary | ICD-10-CM | POA: Diagnosis not present

## 2017-10-06 DIAGNOSIS — R195 Other fecal abnormalities: Secondary | ICD-10-CM | POA: Diagnosis not present

## 2017-10-06 DIAGNOSIS — K5730 Diverticulosis of large intestine without perforation or abscess without bleeding: Secondary | ICD-10-CM | POA: Diagnosis not present

## 2017-10-14 ENCOUNTER — Emergency Department (HOSPITAL_BASED_OUTPATIENT_CLINIC_OR_DEPARTMENT_OTHER)
Admission: EM | Admit: 2017-10-14 | Discharge: 2017-10-15 | Disposition: A | Discharge status: Home / Self Care | Payer: Medicare Other | Attending: Emergency Medicine | Admitting: Emergency Medicine

## 2017-10-14 ENCOUNTER — Other Ambulatory Visit: Payer: Self-pay

## 2017-10-14 ENCOUNTER — Encounter (HOSPITAL_BASED_OUTPATIENT_CLINIC_OR_DEPARTMENT_OTHER): Payer: Self-pay | Admitting: Emergency Medicine

## 2017-10-14 DIAGNOSIS — Z79899 Other long term (current) drug therapy: Secondary | ICD-10-CM | POA: Insufficient documentation

## 2017-10-14 DIAGNOSIS — I83899 Varicose veins of unspecified lower extremities with other complications: Secondary | ICD-10-CM

## 2017-10-14 DIAGNOSIS — N501 Vascular disorders of male genital organs: Secondary | ICD-10-CM | POA: Diagnosis present

## 2017-10-14 DIAGNOSIS — I5032 Chronic diastolic (congestive) heart failure: Secondary | ICD-10-CM | POA: Insufficient documentation

## 2017-10-14 DIAGNOSIS — I868 Varicose veins of other specified sites: Secondary | ICD-10-CM | POA: Diagnosis not present

## 2017-10-14 DIAGNOSIS — Z23 Encounter for immunization: Secondary | ICD-10-CM | POA: Diagnosis not present

## 2017-10-14 DIAGNOSIS — Z7982 Long term (current) use of aspirin: Secondary | ICD-10-CM | POA: Insufficient documentation

## 2017-10-14 DIAGNOSIS — Z96641 Presence of right artificial hip joint: Secondary | ICD-10-CM | POA: Diagnosis not present

## 2017-10-14 DIAGNOSIS — Z87891 Personal history of nicotine dependence: Secondary | ICD-10-CM | POA: Insufficient documentation

## 2017-10-14 DIAGNOSIS — I110 Hypertensive heart disease with heart failure: Secondary | ICD-10-CM | POA: Diagnosis not present

## 2017-10-14 MED ORDER — TETANUS-DIPHTH-ACELL PERTUSSIS 5-2.5-18.5 LF-MCG/0.5 IM SUSP
0.5000 mL | Freq: Once | INTRAMUSCULAR | Status: AC
  Administered 2017-10-15: 0.5 mL via INTRAMUSCULAR
  Filled 2017-10-14: qty 0.5

## 2017-10-14 NOTE — Discharge Instructions (Addendum)
Please return to the ED if you notice any new or worsening symptoms including any persistent bleeding from the area.

## 2017-10-14 NOTE — ED Provider Notes (Signed)
11:50 PM Patient seen in conjunction with Couture PA-C. Small varicosity was bleeding. Now stopped. On ASA, naproxen. Wife is a Marine scientist. Counseled on wound care. Ready for d/c.   BP (!) 143/62 (BP Location: Left Arm)   Pulse 70   Temp 98.2 F (36.8 C) (Oral)   Resp 20   Ht 6' (1.829 m)   Wt 118.8 kg (262 lb)   SpO2 100%   BMI 35.53 kg/m     Carlisle Cater, PA-C 10/15/17 0004    Deno Etienne, DO 10/15/17 0122

## 2017-10-14 NOTE — ED Provider Notes (Signed)
[facility] EMERGENCY DEPARTMENT Provider Note   CSN: [id number] Arrival date & time: 10/14/17  2210     History   Chief Complaint No chief complaint on file.   HPI [name] [name] is a 71 y.o. male.  HPI   Pt presents to the ED c/o left testicular bleeding that he noticed around 6:15pm tonight after taking a shower and drying off with a towel. Pt states that he was bleeding bright red blood for about 3 hours. His wife estimates that he lost about 60cc of blood. She states that she applied ice and alum to stop the bleeding. Pt denies any pulsatile bleeding. He states that he has a h/o varicose veins to his testicles and has had scant bleeding in the past, but he has never bled this much. His last bleeding episode was about a month ago. He is currently on ASA and Naproxen, but denies being on any other blood thinners. He has no h/o bleeding disorders. He denies any other symptoms including any testicular pain, testicular swelling, penile discharge or bleeding, hematuria, bloody stools, lightheadedness or dizziness. Tetanus is not UTD.    Past Medical History:  Diagnosis Date  . 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 Arthritis    BIL KNEES  . Diastolic heart failure, NYHA class 2 ([place])   . Eczema   . Herpesviral conjunctivitis    hx of in past causing need for corneal transplant rt eye  . History of kidney stones   . HTN (hypertension) 05/27/2014    Patient Active Problem List   Diagnosis Date Noted  . Bruit 02/04/2016  . Chronic diastolic CHF (congestive heart failure), NYHA class 2 ([place]) 06/02/2014  . Urinary retention 06/01/2014  . Cardiorenal syndrome with renal failure 05/29/2014  . AKI (acute kidney injury) ([place]) 05/28/2014  . Acute renal failure ([place]) 05/27/2014  . Acute diastolic heart failure, NYHA class 2 ([place]) 05/27/2014  . Acidosis 05/27/2014  . HTN (hypertension) 05/27/2014  . Leukocytosis 05/27/2014  . ARF (acute renal failure) ([place]) 05/27/2014  . SOB  (shortness of breath) 05/27/2014  . Postoperative anemia due to acute blood loss 05/23/2014  . Osteoarthritis of right hip 05/22/2014  . History of total right hip arthroplasty 05/22/2014    Past Surgical History:  Procedure Laterality Date  . CORNEAL TRANSPLANT  1986  . HERNIA REPAIR  1981 /2001 /2012   L ING HERNIA REPAIR X 6  . PILONIDAL CYST EXCISION  1974  . TONSILLECTOMY    . TOTAL HIP ARTHROPLASTY Right 05/22/2014   Procedure: RIGHT TOTAL HIP ARTHROPLASTY;  Surgeon: Tobi Bastos, MD;  Location: [facility];  Service: Orthopedics;  Laterality: Right;       Home Medications    Prior to Admission medications   Medication Sig Start Date End Date Taking? Authorizing Provider  aspirin 81 MG tablet Take 1 tablet (81 mg total) by mouth daily. 08/14/14   Lelon Perla, MD  beta carotene w/minerals (OCUVITE) tablet Take 1 tablet by mouth daily.    [provider]  furosemide (LASIX) 40 MG tablet Take 2 tablets (80 mg total) by mouth daily. May add extra 40 mg , for weight gain 08/14/14   Lelon Perla, MD  losartan (COZAAR) 50 MG tablet Take 50 mg by mouth 2 (two) times daily.    [provider]  meloxicam (MOBIC) 15 MG tablet Take 1 tablet by mouth daily. 12/07/14   [provider]  methocarbamol (ROBAXIN) 500 MG tablet Take  500 mg by mouth daily as needed. 06/11/14   [provider]  potassium chloride SA (K-DUR,KLOR-CON) 20 MEQ tablet Take 1 tablet daily and take an extra dose if you take extra lasix. 06/04/14   Rama, Venetia Maxon, MD  pravastatin (PRAVACHOL) 20 MG tablet Take 1 tablet by mouth daily. 12/07/14   [provider]  tamsulosin (FLOMAX) 0.4 MG CAPS capsule Take 0.4 mg by mouth daily.  06/04/14   Rama, Venetia Maxon, MD  vitamin C (ASCORBIC ACID) 500 MG tablet Take 500 mg by mouth 2 (two) times daily.    [provider]    Family History Family History  Problem Relation Age of Onset  . Alzheimer's disease Mother   .  Cancer - Other Father        cancer of the uvula  . Lung cancer Other     Social History Social History   Tobacco Use  . Smoking status: Former Smoker    Last attempt to quit: 05/17/2011    Years since quitting: 6.4  . Smokeless tobacco: Never Used  Substance Use Topics  . Alcohol use: Yes    Comment: OCCASIONAL  . Drug use: No     Allergies   Prednisone and Penicillins   Review of Systems Review of Systems  Constitutional: Negative for chills and fever.  Respiratory: Negative for shortness of breath.   Cardiovascular: Negative for chest pain.  Gastrointestinal: Negative for abdominal pain, blood in stool, diarrhea, nausea and rectal pain.  Genitourinary: Negative for discharge, hematuria, scrotal swelling and testicular pain.       Bleeding from varicose vein on left testicle  Neurological: Negative for dizziness and light-headedness.  All other systems reviewed and are negative.    Physical Exam Updated Vital Signs BP (!) 143/62 (BP Location: Left Arm)   Pulse 70   Temp 98.2 F (36.8 C) (Oral)   Resp 20   Ht 6' (1.829 m)   Wt 118.8 kg (262 lb)   SpO2 100%   BMI 35.53 kg/m   Physical Exam  Constitutional: He is oriented to person, place, and time. He appears well-developed and well-nourished. No distress.  HENT:  Head: Normocephalic and atraumatic.  Cardiovascular: Normal rate.  Pulmonary/Chest: Effort normal.  Genitourinary: Penis normal. No penile tenderness.  Genitourinary Comments: Examination of the testicles and scrotum was within normal limits. A small amount of alum was noted to the left scrotum. After clearing the alum, there was no evidence of continued bleeding as well as no evidence of abrasions or other breaks in the skin. There was no erythema, warmth, or TTP to the testicles bilaterally.   Musculoskeletal: Normal range of motion.  Neurological: He is alert and oriented to person, place, and time.  Skin: Skin is warm and dry. No rash noted. No  erythema.     ED Treatments / Results  Labs (all labs ordered are listed, but only abnormal results are displayed) Labs Reviewed - No data to display  EKG  EKG Interpretation None       Radiology No results found.  Procedures Procedures (including critical care time)  Medications Ordered in ED Medications  Tdap (BOOSTRIX) injection 0.5 mL (0.5 mLs Intramuscular Given 10/15/17 0005)     Initial Impression / Assessment and Plan / ED Course  I have reviewed the triage vital signs and the nursing notes.  Pertinent labs & imaging results that were available during my care of the patient were reviewed by me and considered in my  medical decision making (see chart for details).   Evaluated patient in the ED. There is no bleeding noted to the scrotal area.    Reevaluated pt along with Rondel Oh, PA and Dr. Tyrone Nine. There continues to be no bleeding on exam. Discussed the plan to discharge after administering a Tdap. Patient and wife are in agreement with the plan for discharge. Advised pt to return to the ER if he experiences any continued bleeding, or any new or worsening symptoms. He and his wife voiced an understanding of the plan.   Final Clinical Impressions(s) / ED Diagnoses   Final diagnoses:  Bleeding from varicose vein   Pt presented to the ED c/o testicular bleeding secondary to varicose veins. Exam revealed no active bleeding and no abrasions to the skin. Pt showed no evidence concerning of of massive blood loss.  Pt appears to be stable for discharge as there is has been no continued bleeding and his vital signs have been stable throughout the visit.   ED Discharge Orders    None       Rodney Booze, PA-C 10/15/17 0028    Deno Etienne, DO 10/15/17 0121

## 2017-10-14 NOTE — ED Triage Notes (Signed)
Pt presents with c/o testicle bleeding. Pt states he has varicose veins and was too rough with a towel tonight. bleeding since 6pm.

## 2017-10-15 DIAGNOSIS — I868 Varicose veins of other specified sites: Secondary | ICD-10-CM | POA: Diagnosis not present

## 2017-10-18 DIAGNOSIS — R197 Diarrhea, unspecified: Secondary | ICD-10-CM | POA: Diagnosis not present

## 2018-02-14 ENCOUNTER — Other Ambulatory Visit: Payer: Self-pay | Admitting: Physician Assistant

## 2018-02-14 DIAGNOSIS — Z8679 Personal history of other diseases of the circulatory system: Secondary | ICD-10-CM

## 2018-02-14 DIAGNOSIS — I714 Abdominal aortic aneurysm, without rupture, unspecified: Secondary | ICD-10-CM

## 2018-02-26 DIAGNOSIS — L309 Dermatitis, unspecified: Secondary | ICD-10-CM | POA: Diagnosis not present

## 2018-02-26 DIAGNOSIS — E785 Hyperlipidemia, unspecified: Secondary | ICD-10-CM | POA: Diagnosis not present

## 2018-02-26 DIAGNOSIS — Z125 Encounter for screening for malignant neoplasm of prostate: Secondary | ICD-10-CM | POA: Diagnosis not present

## 2018-02-26 DIAGNOSIS — M160 Bilateral primary osteoarthritis of hip: Secondary | ICD-10-CM | POA: Diagnosis not present

## 2018-02-26 DIAGNOSIS — I10 Essential (primary) hypertension: Secondary | ICD-10-CM | POA: Diagnosis not present

## 2018-03-05 ENCOUNTER — Ambulatory Visit (HOSPITAL_COMMUNITY)
Admission: RE | Admit: 2018-03-05 | Discharge: 2018-03-05 | Disposition: A | Discharge status: Home / Self Care | Payer: Medicare Other | Source: Ambulatory Visit | Attending: Cardiovascular Disease | Admitting: Cardiovascular Disease

## 2018-03-05 DIAGNOSIS — Z8679 Personal history of other diseases of the circulatory system: Secondary | ICD-10-CM | POA: Insufficient documentation

## 2018-03-05 DIAGNOSIS — I714 Abdominal aortic aneurysm, without rupture, unspecified: Secondary | ICD-10-CM

## 2018-03-05 DIAGNOSIS — I708 Atherosclerosis of other arteries: Secondary | ICD-10-CM | POA: Diagnosis not present

## 2018-03-09 ENCOUNTER — Telehealth: Payer: Self-pay | Admitting: Physician Assistant

## 2018-03-09 NOTE — Telephone Encounter (Signed)
New message ° °Pt verbalized that he is returning call for RN °

## 2018-03-09 NOTE — Telephone Encounter (Signed)
Patient notified directly of doppler. See Result note.

## 2018-04-02 NOTE — Progress Notes (Signed)
HPI: FU diastolic CHF/edema. Echo 7/15 showed normal LV function, grade 1 diastolic dysfunction; mild LAE. Exercise treadmill April 2016 negative. Abd ultrasound 4/19 showed AAA 4.6 cm; fu recommended 6 months. Since I last saw him, patient denies dyspnea, chest pain, palpitations or syncope.  Mild pedal edema.   Current Outpatient Medications  Medication Sig Dispense Refill  . aspirin 81 MG tablet Take 1 tablet (81 mg total) by mouth daily.    . beta carotene w/minerals (OCUVITE) tablet Take 1 tablet by mouth daily.    . furosemide (LASIX) 40 MG tablet Take 2 tablets (80 mg total) by mouth daily. May add extra 40 mg , for weight gain 90 tablet 6  . losartan (COZAAR) 50 MG tablet Take 50 mg by mouth 2 (two) times daily.    . meloxicam (MOBIC) 15 MG tablet Take 1 tablet by mouth daily.    . potassium chloride SA (K-DUR,KLOR-CON) 20 MEQ tablet Take 1 tablet daily and take an extra dose if you take extra lasix. 60 tablet 1  . pravastatin (PRAVACHOL) 20 MG tablet Take 1 tablet by mouth daily.    . tamsulosin (FLOMAX) 0.4 MG CAPS capsule Take 0.4 mg by mouth daily.     . vitamin C (ASCORBIC ACID) 500 MG tablet Take 500 mg by mouth 2 (two) times daily.     No current facility-administered medications for this visit.      Past Medical History:  Diagnosis Date  . 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 Arthritis    BIL KNEES  . Diastolic heart failure, NYHA class 2 ([place])   . Eczema   . Herpesviral conjunctivitis    hx of in past causing need for corneal transplant rt eye  . History of kidney stones   . HTN (hypertension) 05/27/2014    Past Surgical History:  Procedure Laterality Date  . CORNEAL TRANSPLANT  1986  . HERNIA REPAIR  1981 /2001 /2012   L ING HERNIA REPAIR X 6  . PILONIDAL CYST EXCISION  1974  . TONSILLECTOMY    . TOTAL HIP ARTHROPLASTY Right 05/22/2014   Procedure: RIGHT TOTAL HIP ARTHROPLASTY;  Surgeon: Tobi Bastos, MD;  Location: [facility];  Service: Orthopedics;   Laterality: Right;    Social History   Socioeconomic History  . Marital status: Married    Spouse name: Not on file  . Number of children: 2  . Years of education: Not on file  . Highest education level: Not on file  Occupational History    Employer: Teacher, early years/pre  Social Needs  . Financial resource strain: Not on file  . Food insecurity:    Worry: Not on file    Inability: Not on file  . Transportation needs:    Medical: Not on file    Non-medical: Not on file  Tobacco Use  . Smoking status: Former Smoker    Last attempt to quit: 05/17/2011    Years since quitting: 6.8  . Smokeless tobacco: Never Used  Substance and Sexual Activity  . Alcohol use: Yes    Comment: OCCASIONAL  . Drug use: No  . Sexual activity: Never  Lifestyle  . Physical activity:    Days per week: Not on file    Minutes per session: Not on file  . Stress: Not on file  Relationships  . Social connections:    Talks on phone: Not on file    Gets together: Not on file  Attends religious service: Not on file    Active member of club or organization: Not on file    Attends meetings of clubs or organizations: Not on file    Relationship status: Not on file  . Intimate partner violence:    Fear of current or ex partner: Not on file    Emotionally abused: Not on file    Physically abused: Not on file    Forced sexual activity: Not on file  Other Topics Concern  . Not on file  Social History Narrative  . Not on file    Family History  Problem Relation Age of Onset  . Alzheimer's disease Mother   . Cancer - Other Father        cancer of the uvula  . Lung cancer Other     ROS: Arthralgias but no fevers or chills, productive cough, hemoptysis, dysphasia, odynophagia, melena, hematochezia, dysuria, hematuria, rash, seizure activity, orthopnea, PND, claudication. Remaining systems are negative.  Physical Exam: Well-developed well-nourished in no acute distress.  Skin is warm and dry.    HEENT is normal.  Neck is supple.  Chest is clear to auscultation with normal expansion.  Cardiovascular exam is regular rate and rhythm.  Abdominal exam nontender or distended. No masses palpated. Extremities show 1+ edema. neuro grossly intact  ECG-sinus bradycardia at a rate of 53.  No ST changes.  Personally reviewed  A/P  1 chronic diastolic congestive heart failure-patient appears to be euvolemic on examination.  Much of previous volume excess was felt secondary to renal insufficiency with decreased urine output.  Continue present dose of Lasix.  Continue low-sodium diet and fluid restriction.  Potassium and renal function monitored by primary care.  2 abdominal aortic aneurysm-plan follow-up abdominal ultrasound October 2019.  I discussed that he will potentially require repair in the future.  3 hypertension-pressure is controlled.  Continue present medications.  Kirk Ruths, MD

## 2018-04-05 ENCOUNTER — Ambulatory Visit (INDEPENDENT_AMBULATORY_CARE_PROVIDER_SITE_OTHER): Payer: Medicare Other | Admitting: Cardiology

## 2018-04-05 ENCOUNTER — Encounter: Payer: Self-pay | Admitting: Cardiology

## 2018-04-05 VITALS — BP 108/60 | Ht 71.0 in | Wt 257.0 lb

## 2018-04-05 DIAGNOSIS — I10 Essential (primary) hypertension: Secondary | ICD-10-CM | POA: Diagnosis not present

## 2018-04-05 DIAGNOSIS — I714 Abdominal aortic aneurysm, without rupture, unspecified: Secondary | ICD-10-CM

## 2018-04-05 DIAGNOSIS — I5032 Chronic diastolic (congestive) heart failure: Secondary | ICD-10-CM

## 2018-04-05 NOTE — Patient Instructions (Signed)
Your physician wants you to follow-up in: 6 MONTHS WITH DR CRENSHAW You will receive a reminder letter in the mail two months in advance. If you don't receive a letter, please call our office to schedule the follow-up appointment.   If you need a refill on your cardiac medications before your next appointment, please call your pharmacy.  

## 2018-04-13 ENCOUNTER — Other Ambulatory Visit: Payer: Self-pay

## 2018-04-13 DIAGNOSIS — I714 Abdominal aortic aneurysm, without rupture, unspecified: Secondary | ICD-10-CM

## 2018-08-27 ENCOUNTER — Ambulatory Visit (HOSPITAL_COMMUNITY)
Admission: RE | Admit: 2018-08-27 | Discharge: 2018-08-27 | Disposition: A | Discharge status: Home / Self Care | Payer: Medicare Other | Source: Ambulatory Visit | Attending: Cardiology | Admitting: Cardiology

## 2018-08-27 DIAGNOSIS — I714 Abdominal aortic aneurysm, without rupture, unspecified: Secondary | ICD-10-CM

## 2018-08-28 ENCOUNTER — Other Ambulatory Visit: Payer: Self-pay | Admitting: *Deleted

## 2018-08-28 DIAGNOSIS — I714 Abdominal aortic aneurysm, without rupture, unspecified: Secondary | ICD-10-CM

## 2018-09-17 ENCOUNTER — Encounter: Payer: Self-pay | Admitting: Cardiology

## 2018-09-24 DIAGNOSIS — D649 Anemia, unspecified: Secondary | ICD-10-CM | POA: Diagnosis not present

## 2018-09-24 DIAGNOSIS — I10 Essential (primary) hypertension: Secondary | ICD-10-CM | POA: Diagnosis not present

## 2018-09-24 DIAGNOSIS — R7303 Prediabetes: Secondary | ICD-10-CM | POA: Diagnosis not present

## 2018-09-24 DIAGNOSIS — E785 Hyperlipidemia, unspecified: Secondary | ICD-10-CM | POA: Diagnosis not present

## 2018-09-26 DIAGNOSIS — I5030 Unspecified diastolic (congestive) heart failure: Secondary | ICD-10-CM | POA: Diagnosis not present

## 2018-09-26 DIAGNOSIS — Z0000 Encounter for general adult medical examination without abnormal findings: Secondary | ICD-10-CM | POA: Diagnosis not present

## 2018-09-26 DIAGNOSIS — I714 Abdominal aortic aneurysm, without rupture: Secondary | ICD-10-CM | POA: Diagnosis not present

## 2018-09-26 DIAGNOSIS — I700 Atherosclerosis of aorta: Secondary | ICD-10-CM | POA: Diagnosis not present

## 2018-09-26 DIAGNOSIS — D692 Other nonthrombocytopenic purpura: Secondary | ICD-10-CM | POA: Diagnosis not present

## 2018-09-26 DIAGNOSIS — E785 Hyperlipidemia, unspecified: Secondary | ICD-10-CM | POA: Diagnosis not present

## 2018-09-26 DIAGNOSIS — I10 Essential (primary) hypertension: Secondary | ICD-10-CM | POA: Diagnosis not present

## 2018-09-26 DIAGNOSIS — Z23 Encounter for immunization: Secondary | ICD-10-CM | POA: Diagnosis not present

## 2018-09-27 NOTE — Progress Notes (Signed)
HPI: FU diastolic CHF/edema. Echo 7/15 showed normal LV function, grade 1 diastolic dysfunction; mild LAE. Exercise treadmill April 2016 negative. Abdominal ultrasound October 2019 showed 4.2 cm abdominal aortic aneurysm.  Follow-up recommended 12 months.  Since I last saw him,  there is no dyspnea, chest pain, palpitations or syncope.  Mild pedal edema.  Current Outpatient Medications  Medication Sig Dispense Refill  . aspirin 81 MG tablet Take 1 tablet (81 mg total) by mouth daily.    . beta carotene w/minerals (OCUVITE) tablet Take 1 tablet by mouth daily.    . furosemide (LASIX) 40 MG tablet Take 2 tablets (80 mg total) by mouth daily. May add extra 40 mg , for weight gain (Patient taking differently: Take 40 mg by mouth daily. May add extra 40 mg , for weight gain) 90 tablet 6  . losartan (COZAAR) 50 MG tablet Take 50 mg by mouth 2 (two) times daily.    . meloxicam (MOBIC) 15 MG tablet Take 1 tablet by mouth daily.    . potassium chloride SA (K-DUR,KLOR-CON) 20 MEQ tablet Take 1 tablet daily and take an extra dose if you take extra lasix. 60 tablet 1  . pravastatin (PRAVACHOL) 20 MG tablet Take 1 tablet by mouth daily.    . tamsulosin (FLOMAX) 0.4 MG CAPS capsule Take 0.4 mg by mouth daily.     . vitamin C (ASCORBIC ACID) 500 MG tablet Take 500 mg by mouth 2 (two) times daily.     No current facility-administered medications for this visit.      Past Medical History:  Diagnosis Date  . Adrenal gland cyst ([place])   . Arthritis    BIL KNEES  . Diastolic heart failure, NYHA class 2 ([place])   . Eczema   . Herpesviral conjunctivitis    hx of in past causing need for corneal transplant rt eye  . History of kidney stones   . HTN (hypertension) 05/27/2014    Past Surgical History:  Procedure Laterality Date  . CORNEAL TRANSPLANT  1986  . HERNIA REPAIR  1981 /2001 /2012   L ING HERNIA REPAIR X 6  . PILONIDAL CYST EXCISION  1974  . TONSILLECTOMY    . TOTAL HIP ARTHROPLASTY Right  05/22/2014   Procedure: RIGHT TOTAL HIP ARTHROPLASTY;  Surgeon: Tobi Bastos, MD;  Location: [facility];  Service: Orthopedics;  Laterality: Right;    Social History   Socioeconomic History  . Marital status: Married    Spouse name: Not on file  . Number of children: 2  . Years of education: Not on file  . Highest education level: Not on file  Occupational History    Employer: Teacher, early years/pre  Social Needs  . Financial resource strain: Not on file  . Food insecurity:    Worry: Not on file    Inability: Not on file  . Transportation needs:    Medical: Not on file    Non-medical: Not on file  Tobacco Use  . Smoking status: Former Smoker    Last attempt to quit: 05/17/2011    Years since quitting: 7.4  . Smokeless tobacco: Never Used  Substance and Sexual Activity  . Alcohol use: Yes    Comment: OCCASIONAL  . Drug use: No  . Sexual activity: Never  Lifestyle  . Physical activity:    Days per week: Not on file    Minutes per session: Not on file  . Stress: Not on file  Relationships  .  Social connections:    Talks on phone: Not on file    Gets together: Not on file    Attends religious service: Not on file    Active member of club or organization: Not on file    Attends meetings of clubs or organizations: Not on file    Relationship status: Not on file  . Intimate partner violence:    Fear of current or ex partner: Not on file    Emotionally abused: Not on file    Physically abused: Not on file    Forced sexual activity: Not on file  Other Topics Concern  . Not on file  Social History Narrative  . Not on file    Family History  Problem Relation Age of Onset  . Alzheimer's disease Mother   . Cancer - Other Father        cancer of the uvula  . Lung cancer Other     ROS: no fevers or chills, productive cough, hemoptysis, dysphasia, odynophagia, melena, hematochezia, dysuria, hematuria, rash, seizure activity, orthopnea, PND, claudication. Remaining  systems are negative.  Physical Exam: Well-developed well-nourished in no acute distress.  Skin is warm and dry.  HEENT is normal.  Neck is supple.  Chest is clear to auscultation with normal expansion.  Cardiovascular exam is regular rate and rhythm.  Abdominal exam nontender or distended. No masses palpated. Extremities show 1+ ankle edema. neuro grossly intact  A/P  1 chronic diastolic congestive heart failure-patient doing well from a symptomatic standpoint.  He appears to be euvolemic today.  Much of previous volume excess was felt secondary to renal insufficiency/decreased urine output.  Continue present dose of Lasix.  We discussed fluid restriction and low-sodium diet. Obtain most recent BMET from primary care.  2 abdominal aortic aneurysm-plan follow-up abdominal ultrasound October 2020.  3 hypertension-blood pressure is controlled.  Continue present medications and follow.  Kirk Ruths, MD

## 2018-10-08 ENCOUNTER — Encounter: Payer: Self-pay | Admitting: Cardiology

## 2018-10-08 ENCOUNTER — Ambulatory Visit (INDEPENDENT_AMBULATORY_CARE_PROVIDER_SITE_OTHER): Payer: Medicare Other | Admitting: Cardiology

## 2018-10-08 VITALS — BP 102/52 | HR 66 | Ht 72.0 in | Wt 258.0 lb

## 2018-10-08 DIAGNOSIS — I5032 Chronic diastolic (congestive) heart failure: Secondary | ICD-10-CM

## 2018-10-08 DIAGNOSIS — I714 Abdominal aortic aneurysm, without rupture, unspecified: Secondary | ICD-10-CM

## 2018-10-08 DIAGNOSIS — I10 Essential (primary) hypertension: Secondary | ICD-10-CM

## 2018-10-08 NOTE — Patient Instructions (Signed)

## 2018-12-11 DIAGNOSIS — J0100 Acute maxillary sinusitis, unspecified: Secondary | ICD-10-CM | POA: Diagnosis not present

## 2019-04-19 ENCOUNTER — Telehealth: Payer: Self-pay

## 2019-04-19 NOTE — Telephone Encounter (Signed)
Patient called on cell number and home number, left VM to return call back to [phone] between the hours 0700-1900 Monday through Friday to schedule covid test. Detailed message left per DPR,   COVID testing Received: Today Message Contents  Olivet Callas, NP  [facility] Testing Pool        Patient needing COVID-19 testing, please. Thank you

## 2019-04-19 NOTE — Telephone Encounter (Signed)
Thank you!  [name] [name]

## 2019-04-19 NOTE — Telephone Encounter (Signed)
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

## 2019-08-28 ENCOUNTER — Other Ambulatory Visit: Payer: Self-pay

## 2019-08-28 ENCOUNTER — Ambulatory Visit (HOSPITAL_COMMUNITY)
Admission: RE | Admit: 2019-08-28 | Discharge: 2019-08-28 | Disposition: A | Discharge status: Home / Self Care | Payer: Medicare Other | Source: Ambulatory Visit | Attending: Internal Medicine | Admitting: Internal Medicine

## 2019-08-28 ENCOUNTER — Other Ambulatory Visit (HOSPITAL_COMMUNITY): Payer: Self-pay | Admitting: Cardiology

## 2019-08-28 DIAGNOSIS — I714 Abdominal aortic aneurysm, without rupture, unspecified: Secondary | ICD-10-CM

## 2019-08-29 ENCOUNTER — Encounter: Payer: Self-pay | Admitting: *Deleted

## 2019-08-29 ENCOUNTER — Other Ambulatory Visit: Payer: Self-pay | Admitting: *Deleted

## 2019-08-29 DIAGNOSIS — I714 Abdominal aortic aneurysm, without rupture, unspecified: Secondary | ICD-10-CM

## 2019-10-28 NOTE — Progress Notes (Signed)
HPI: FU diastolic CHF/edema. Echo7/15showed normal LV function, grade 1 diastolic dysfunction; mild LAE. Exercise treadmill April 2016 negative. Abdominal ultrasound October 2020 showed 4.2 cm abdominal aortic aneurysm.  Follow-up recommended 12 months.  Since I last saw him, he denies dyspnea, chest pain, palpitations or syncope.  Mild pedal edema.  Current Outpatient Medications  Medication Sig Dispense Refill  . aspirin 81 MG tablet Take 1 tablet (81 mg total) by mouth daily.    . beta carotene w/minerals (OCUVITE) tablet Take 1 tablet by mouth daily.    . furosemide (LASIX) 40 MG tablet Take 2 tablets (80 mg total) by mouth daily. May add extra 40 mg , for weight gain (Patient taking differently: Take 40 mg by mouth daily. May add extra 40 mg , for weight gain) 90 tablet 6  . losartan (COZAAR) 50 MG tablet Take 50 mg by mouth 2 (two) times daily.    . meloxicam (MOBIC) 15 MG tablet Take 1 tablet by mouth daily.    . potassium chloride SA (K-DUR,KLOR-CON) 20 MEQ tablet Take 1 tablet daily and take an extra dose if you take extra lasix. 60 tablet 1  . pravastatin (PRAVACHOL) 20 MG tablet Take 1 tablet by mouth daily.    . tamsulosin (FLOMAX) 0.4 MG CAPS capsule Take 0.4 mg by mouth daily.     . vitamin C (ASCORBIC ACID) 500 MG tablet Take 500 mg by mouth 2 (two) times daily.     No current facility-administered medications for this visit.     Past Medical History:  Diagnosis Date  . Adrenal gland cyst ([place])   . Arthritis    BIL KNEES  . Diastolic heart failure, NYHA class 2 ([place])   . Eczema   . Herpesviral conjunctivitis    hx of in past causing need for corneal transplant rt eye  . History of kidney stones   . HTN (hypertension) 05/27/2014    Past Surgical History:  Procedure Laterality Date  . CORNEAL TRANSPLANT  1986  . HERNIA REPAIR  1981 /2001 /2012   L ING HERNIA REPAIR X 6  . PILONIDAL CYST EXCISION  1974  . TONSILLECTOMY    . TOTAL HIP ARTHROPLASTY Right  05/22/2014   Procedure: RIGHT TOTAL HIP ARTHROPLASTY;  Surgeon: Tobi Bastos, MD;  Location: [facility];  Service: Orthopedics;  Laterality: Right;    Social History   Socioeconomic History  . Marital status: Married    Spouse name: Not on file  . Number of children: 2  . Years of education: Not on file  . Highest education level: Not on file  Occupational History    Employer: [employer]  Tobacco Use  . Smoking status: Former Smoker    Quit date: 05/17/2011    Years since quitting: 8.4  . Smokeless tobacco: Never Used  Substance and Sexual Activity  . Alcohol use: Yes    Comment: OCCASIONAL  . Drug use: No  . Sexual activity: Never  Other Topics Concern  . Not on file  Social History Narrative  . Not on file   Social Determinants of Health   Financial Resource Strain:   . Difficulty of Paying Living Expenses: Not on file  Food Insecurity:   . Worried About Charity fundraiser in the Last Year: Not on file  . Ran Out of Food in the Last Year: Not on file  Transportation Needs:   . Lack of Transportation (Medical): Not on file  .  Lack of Transportation (Non-Medical): Not on file  Physical Activity:   . Days of Exercise per Week: Not on file  . Minutes of Exercise per Session: Not on file  Stress:   . Feeling of Stress : Not on file  Social Connections:   . Frequency of Communication with Friends and Family: Not on file  . Frequency of Social Gatherings with Friends and Family: Not on file  . Attends Religious Services: Not on file  . Active Member of Clubs or Organizations: Not on file  . Attends Archivist Meetings: Not on file  . Marital Status: Not on file  Intimate Partner Violence:   . Fear of Current or Ex-Partner: Not on file  . Emotionally Abused: Not on file  . Physically Abused: Not on file  . Sexually Abused: Not on file    Family History  Problem Relation Age of Onset  . Alzheimer's disease Mother   . Cancer - Other Father         cancer of the uvula  . Lung cancer Other     ROS: Knee arthralgias but no fevers or chills, productive cough, hemoptysis, dysphasia, odynophagia, melena, hematochezia, dysuria, hematuria, rash, seizure activity, orthopnea, PND, claudication. Remaining systems are negative.  Physical Exam: Well-developed well-nourished in no acute distress.  Skin is warm and dry.  HEENT is normal.  Neck is supple.  Chest is clear to auscultation with normal expansion.  Cardiovascular exam is regular rate and rhythm.  Abdominal exam nontender or distended. No masses palpated. Extremities show 1+ edema; chronic skin changes. neuro grossly intact  ECG-normal sinus rhythm at a rate of 68, no ST changes.  Personally reviewed  A/P  1 chronic diastolic congestive heart failure-patient appears to be euvolemic.  As outlined in previous notes much of previous volume excess was felt secondary to renal insufficiency/decreased urine output.  Continue Lasix.  Continue fluid restriction and low-sodium diet.  Check potassium, renal function and BNP.  2 hypertension-blood pressure controlled.  Continue present medical regimen.  3 abdominal aortic aneurysm-plan follow-up ultrasound in October 2021.  4 hyperlipidemia-continue statin.  Check lipids and liver.  Kirk Ruths, MD

## 2019-10-31 ENCOUNTER — Encounter: Payer: Self-pay | Admitting: Cardiology

## 2019-10-31 ENCOUNTER — Ambulatory Visit (INDEPENDENT_AMBULATORY_CARE_PROVIDER_SITE_OTHER): Payer: Medicare Other | Admitting: Cardiology

## 2019-10-31 ENCOUNTER — Other Ambulatory Visit: Payer: Self-pay

## 2019-10-31 VITALS — BP 104/56 | HR 68 | Temp 97.2°F | Ht 69.0 in | Wt 253.6 lb

## 2019-10-31 DIAGNOSIS — R601 Generalized edema: Secondary | ICD-10-CM

## 2019-10-31 DIAGNOSIS — I10 Essential (primary) hypertension: Secondary | ICD-10-CM | POA: Diagnosis not present

## 2019-10-31 DIAGNOSIS — I5032 Chronic diastolic (congestive) heart failure: Secondary | ICD-10-CM | POA: Diagnosis not present

## 2019-10-31 DIAGNOSIS — I714 Abdominal aortic aneurysm, without rupture, unspecified: Secondary | ICD-10-CM

## 2019-10-31 DIAGNOSIS — E7800 Pure hypercholesterolemia, unspecified: Secondary | ICD-10-CM | POA: Diagnosis not present

## 2019-10-31 NOTE — Patient Instructions (Signed)
Medication Instructions:  NO CHANGE *If you need a refill on your cardiac medications before your next appointment, please call your pharmacy*  Lab Work: Your physician recommends that you HAVE LAB WORK TODAY If you have labs (blood work) drawn today and your tests are completely normal, you will receive your results only by: . MyChart Message (if you have MyChart) OR . A paper copy in the mail If you have any lab test that is abnormal or we need to change your treatment, we will call you to review the results.  Follow-Up: At [facility], you and your health needs are our priority.  As part of our continuing mission to provide you with exceptional heart care, we have created designated Provider Care Teams.  These Care Teams include your primary Cardiologist (physician) and Advanced Practice Providers (APPs -  Physician Assistants and Nurse Practitioners) who all work together to provide you with the care you need, when you need it.  Your next appointment:   12 month(s)  The format for your next appointment:   Either In Person or Virtual  Provider:   You may see BRIAN CRENSHAW MD or one of the following Advanced Practice Providers on your designated Care Team:    Luke Kilroy, PA-C  Callie Goodrich, PA-C  Jesse Cleaver, FNP    

## 2019-11-01 LAB — LIPID PANEL
Chol/HDL Ratio: 3.2 ratio (ref 0.0–5.0)
Cholesterol, Total: 177 mg/dL (ref 100–199)
HDL: 56 mg/dL (ref >39)
LDL Chol Calc (NIH): 110 mg/dL — ABNORMAL HIGH (ref 0–99)
Triglycerides: 59 mg/dL (ref 0–149)
VLDL Cholesterol Cal: 11 mg/dL (ref 5–40)

## 2019-11-01 LAB — COMPREHENSIVE METABOLIC PANEL
ALT: 23 IU/L (ref 0–44)
AST: 20 IU/L (ref 0–40)
Albumin/Globulin Ratio: 2.4 — ABNORMAL HIGH (ref 1.2–2.2)
Albumin: 4.5 g/dL (ref 3.7–4.7)
Alkaline Phosphatase: 60 IU/L (ref 39–117)
BUN/Creatinine Ratio: 29 — ABNORMAL HIGH (ref 10–24)
BUN: 36 mg/dL — ABNORMAL HIGH (ref 8–27)
Bilirubin Total: 0.8 mg/dL (ref 0.0–1.2)
CO2: 18 mmol/L — ABNORMAL LOW (ref 20–29)
Calcium: 9.5 mg/dL (ref 8.6–10.2)
Chloride: 103 mmol/L (ref 96–106)
Creatinine, Ser: 1.23 mg/dL (ref 0.76–1.27)
GFR calc Af Amer: 67 mL/min/{1.73_m2} (ref >59)
GFR calc non Af Amer: 58 mL/min/{1.73_m2} — ABNORMAL LOW (ref >59)
Globulin, Total: 1.9 g/dL (ref 1.5–4.5)
Glucose: 90 mg/dL (ref 65–99)
Potassium: 4.4 mmol/L (ref 3.5–5.2)
Sodium: 138 mmol/L (ref 134–144)
Total Protein: 6.4 g/dL (ref 6.0–8.5)

## 2019-11-01 LAB — PRO B NATRIURETIC PEPTIDE: NT-Pro BNP: 23 pg/mL (ref 0–376)

## 2019-11-04 ENCOUNTER — Telehealth: Payer: Self-pay

## 2019-11-04 DIAGNOSIS — Z0000 Encounter for general adult medical examination without abnormal findings: Secondary | ICD-10-CM | POA: Diagnosis not present

## 2019-11-04 DIAGNOSIS — E785 Hyperlipidemia, unspecified: Secondary | ICD-10-CM | POA: Diagnosis not present

## 2019-11-04 DIAGNOSIS — E7800 Pure hypercholesterolemia, unspecified: Secondary | ICD-10-CM

## 2019-11-04 DIAGNOSIS — I10 Essential (primary) hypertension: Secondary | ICD-10-CM | POA: Diagnosis not present

## 2019-11-04 DIAGNOSIS — I714 Abdominal aortic aneurysm, without rupture: Secondary | ICD-10-CM | POA: Diagnosis not present

## 2019-11-04 DIAGNOSIS — I700 Atherosclerosis of aorta: Secondary | ICD-10-CM | POA: Diagnosis not present

## 2019-11-04 DIAGNOSIS — I5030 Unspecified diastolic (congestive) heart failure: Secondary | ICD-10-CM | POA: Diagnosis not present

## 2019-11-04 DIAGNOSIS — D692 Other nonthrombocytopenic purpura: Secondary | ICD-10-CM | POA: Diagnosis not present

## 2019-11-04 MED ORDER — ROSUVASTATIN CALCIUM 20 MG PO TABS: 20.0000 mg | ORAL_TABLET | Freq: Every day | ORAL | 3 refills | Status: AC

## 2019-11-04 NOTE — Telephone Encounter (Signed)
Results reviewed via telephone with patient. Patient to start crestor 20mg  daily. Discontinued pravastatin. Patient to return for lipid and liver labs in 12 weeks, lab slips mailed to patient. Patient verbalized understanding.

## 2019-11-04 NOTE — Telephone Encounter (Signed)
-----   Message from Lelon Perla, MD sent at 11/01/2019  5:41 AM EST ----- DC pravastatin; crestor 20 mg daily; lipids and liver 12 weeks Kirk Ruths

## 2020-01-16 DIAGNOSIS — M170 Bilateral primary osteoarthritis of knee: Secondary | ICD-10-CM | POA: Diagnosis not present

## 2020-01-16 DIAGNOSIS — M25561 Pain in right knee: Secondary | ICD-10-CM | POA: Diagnosis not present

## 2020-01-16 DIAGNOSIS — M25562 Pain in left knee: Secondary | ICD-10-CM | POA: Diagnosis not present

## 2020-03-11 DIAGNOSIS — M25561 Pain in right knee: Secondary | ICD-10-CM | POA: Diagnosis not present

## 2020-03-11 DIAGNOSIS — M170 Bilateral primary osteoarthritis of knee: Secondary | ICD-10-CM | POA: Diagnosis not present

## 2020-03-11 DIAGNOSIS — M25562 Pain in left knee: Secondary | ICD-10-CM | POA: Diagnosis not present

## 2020-03-18 DIAGNOSIS — M170 Bilateral primary osteoarthritis of knee: Secondary | ICD-10-CM | POA: Diagnosis not present

## 2020-03-18 DIAGNOSIS — M25562 Pain in left knee: Secondary | ICD-10-CM | POA: Diagnosis not present

## 2020-03-18 DIAGNOSIS — M25561 Pain in right knee: Secondary | ICD-10-CM | POA: Diagnosis not present

## 2020-03-24 DIAGNOSIS — M170 Bilateral primary osteoarthritis of knee: Secondary | ICD-10-CM | POA: Diagnosis not present

## 2020-03-24 DIAGNOSIS — M25561 Pain in right knee: Secondary | ICD-10-CM | POA: Diagnosis not present

## 2020-03-24 DIAGNOSIS — M25562 Pain in left knee: Secondary | ICD-10-CM | POA: Diagnosis not present

## 2020-05-19 DIAGNOSIS — M25561 Pain in right knee: Secondary | ICD-10-CM | POA: Diagnosis not present

## 2020-05-19 DIAGNOSIS — M25562 Pain in left knee: Secondary | ICD-10-CM | POA: Diagnosis not present

## 2020-06-17 DIAGNOSIS — M170 Bilateral primary osteoarthritis of knee: Secondary | ICD-10-CM | POA: Diagnosis not present

## 2020-08-28 ENCOUNTER — Encounter: Payer: Self-pay | Admitting: *Deleted

## 2020-08-28 ENCOUNTER — Other Ambulatory Visit (HOSPITAL_COMMUNITY): Payer: Self-pay | Admitting: Cardiology

## 2020-08-28 ENCOUNTER — Ambulatory Visit (HOSPITAL_COMMUNITY)
Admission: RE | Admit: 2020-08-28 | Discharge: 2020-08-28 | Disposition: A | Discharge status: Home / Self Care | Payer: BC Managed Care – PPO | Source: Ambulatory Visit | Attending: Cardiology | Admitting: Cardiology

## 2020-08-28 ENCOUNTER — Other Ambulatory Visit: Payer: Self-pay

## 2020-08-28 DIAGNOSIS — I714 Abdominal aortic aneurysm, without rupture, unspecified: Secondary | ICD-10-CM

## 2020-11-05 NOTE — Progress Notes (Signed)
HPI: FU diastolic CHF/edema. Echo7/15showed normal LV function, grade 1 diastolic dysfunction; mild LAE. Exercise treadmill April 2016 negative. Abdominal ultrasound October 2021 showed 4.6 cm abdominal aortic aneurysm which had increased in size from 4.2 cm previously.  Follow-up recommended 6 months.  Since I last saw him,  patient denies dyspnea, chest pain, palpitations or syncope.  Current Outpatient Medications  Medication Sig Dispense Refill  . aspirin 81 MG tablet Take 1 tablet (81 mg total) by mouth daily.    . beta carotene w/minerals (OCUVITE) tablet Take 1 tablet by mouth daily.    . clindamycin (CLEOCIN) 150 MG capsule Take 600 mg by mouth as needed (prior to dental procedure).    . ferrous gluconate (FERGON) 324 MG tablet Take by mouth.    . furosemide (LASIX) 40 MG tablet Take 2 tablets (80 mg total) by mouth daily. May add extra 40 mg , for weight gain (Patient taking differently: Take 40 mg by mouth daily. May add extra 40 mg , for weight gain) 90 tablet 6  . losartan (COZAAR) 50 MG tablet Take 50 mg by mouth 2 (two) times daily.    . meloxicam (MOBIC) 15 MG tablet Take 1 tablet by mouth daily.    . potassium chloride SA (K-DUR,KLOR-CON) 20 MEQ tablet Take 1 tablet daily and take an extra dose if you take extra lasix. 60 tablet 1  . rosuvastatin (CRESTOR) 20 MG tablet Take 1 tablet (20 mg total) by mouth daily. 90 tablet 3  . tamsulosin (FLOMAX) 0.4 MG CAPS capsule Take 0.4 mg by mouth daily.     . vitamin C (ASCORBIC ACID) 500 MG tablet Take 500 mg by mouth 2 (two) times daily.     No current facility-administered medications for this visit.     Past Medical History:  Diagnosis Date  . Adrenal gland cyst ([place])   . Arthritis    BIL KNEES  . Diastolic heart failure, NYHA class 2 ([place])   . Eczema   . Herpesviral conjunctivitis    hx of in past causing need for corneal transplant rt eye  . History of kidney stones   . HTN (hypertension) 05/27/2014    Past  Surgical History:  Procedure Laterality Date  . CORNEAL TRANSPLANT  1986  . HERNIA REPAIR  1981 /2001 /2012   L ING HERNIA REPAIR X 6  . PILONIDAL CYST EXCISION  1974  . TONSILLECTOMY    . TOTAL HIP ARTHROPLASTY Right 05/22/2014   Procedure: RIGHT TOTAL HIP ARTHROPLASTY;  Surgeon: Tobi Bastos, MD;  Location: [facility];  Service: Orthopedics;  Laterality: Right;    Social History   Socioeconomic History  . Marital status: Married    Spouse name: Not on file  . Number of children: 2  . Years of education: Not on file  . Highest education level: Not on file  Occupational History    Employer: [employer]  Tobacco Use  . Smoking status: Former Smoker    Quit date: 05/17/2011    Years since quitting: 9.4  . Smokeless tobacco: Never Used  Vaping Use  . Vaping Use: Never used  Substance and Sexual Activity  . Alcohol use: Yes    Comment: OCCASIONAL  . Drug use: No  . Sexual activity: Never  Other Topics Concern  . Not on file  Social History Narrative  . Not on file   Social Determinants of Health   Financial Resource Strain: Not on file  Food  Insecurity: Not on file  Transportation Needs: Not on file  Physical Activity: Not on file  Stress: Not on file  Social Connections: Not on file  Intimate Partner Violence: Not on file    Family History  Problem Relation Age of Onset  . Alzheimer's disease Mother   . Cancer - Other Father        cancer of the uvula  . Lung cancer Other     ROS: Knee arthralgias but no fevers or chills, productive cough, hemoptysis, dysphasia, odynophagia, melena, hematochezia, dysuria, hematuria, rash, seizure activity, orthopnea, PND, pedal edema, claudication. Remaining systems are negative.  Physical Exam: Well-developed well-nourished in no acute distress.  Skin is warm and dry.  HEENT is normal.  Neck is supple.  Chest is clear to auscultation with normal expansion.  Cardiovascular exam is regular rate and rhythm.   Abdominal exam nontender or distended. No masses palpated. Extremities show no edema. neuro grossly intact  ECG-sinus rhythm at a rate of 85, RV conduction delay.  Personally reviewed  A/P  1 chronic diastolic congestive heart failure-patient is euvolemic today on examination.  Continue Lasix at present dose.    2 abdominal aortic aneurysm-plan follow-up abdominal ultrasound April 2022.  3 hypertension-patient's blood pressure is controlled today.  Continue present medications and follow.  4 hyperlipidemia-continue statin.   Olga Millers, MD

## 2020-11-12 ENCOUNTER — Ambulatory Visit (INDEPENDENT_AMBULATORY_CARE_PROVIDER_SITE_OTHER): Payer: BC Managed Care – PPO | Admitting: Cardiology

## 2020-11-12 ENCOUNTER — Encounter: Payer: Self-pay | Admitting: Cardiology

## 2020-11-12 ENCOUNTER — Other Ambulatory Visit: Payer: Self-pay

## 2020-11-12 VITALS — BP 100/60 | HR 85 | Ht 72.0 in | Wt 244.6 lb

## 2020-11-12 DIAGNOSIS — I5032 Chronic diastolic (congestive) heart failure: Secondary | ICD-10-CM | POA: Diagnosis not present

## 2020-11-12 DIAGNOSIS — I10 Essential (primary) hypertension: Secondary | ICD-10-CM | POA: Diagnosis not present

## 2020-11-12 DIAGNOSIS — E7800 Pure hypercholesterolemia, unspecified: Secondary | ICD-10-CM

## 2020-11-12 DIAGNOSIS — I714 Abdominal aortic aneurysm, without rupture, unspecified: Secondary | ICD-10-CM

## 2020-11-12 NOTE — Patient Instructions (Signed)

## 2021-01-15 DIAGNOSIS — E785 Hyperlipidemia, unspecified: Secondary | ICD-10-CM | POA: Diagnosis not present

## 2021-01-15 DIAGNOSIS — I700 Atherosclerosis of aorta: Secondary | ICD-10-CM | POA: Diagnosis not present

## 2021-01-15 DIAGNOSIS — R7303 Prediabetes: Secondary | ICD-10-CM | POA: Diagnosis not present

## 2021-01-15 DIAGNOSIS — D692 Other nonthrombocytopenic purpura: Secondary | ICD-10-CM | POA: Diagnosis not present

## 2021-01-15 DIAGNOSIS — I5030 Unspecified diastolic (congestive) heart failure: Secondary | ICD-10-CM | POA: Diagnosis not present

## 2021-01-15 DIAGNOSIS — I10 Essential (primary) hypertension: Secondary | ICD-10-CM | POA: Diagnosis not present

## 2021-01-15 DIAGNOSIS — Z0000 Encounter for general adult medical examination without abnormal findings: Secondary | ICD-10-CM | POA: Diagnosis not present

## 2021-01-15 DIAGNOSIS — I714 Abdominal aortic aneurysm, without rupture: Secondary | ICD-10-CM | POA: Diagnosis not present

## 2021-02-01 ENCOUNTER — Other Ambulatory Visit: Payer: Self-pay | Admitting: *Deleted

## 2021-02-01 DIAGNOSIS — I714 Abdominal aortic aneurysm, without rupture, unspecified: Secondary | ICD-10-CM

## 2021-02-01 NOTE — Progress Notes (Signed)
vas 

## 2021-02-26 ENCOUNTER — Encounter: Payer: Self-pay | Admitting: *Deleted

## 2021-02-26 ENCOUNTER — Other Ambulatory Visit: Payer: Self-pay

## 2021-02-26 ENCOUNTER — Ambulatory Visit (HOSPITAL_COMMUNITY)
Admission: RE | Admit: 2021-02-26 | Discharge: 2021-02-26 | Disposition: A | Discharge status: Home / Self Care | Payer: BC Managed Care – PPO | Source: Ambulatory Visit | Attending: Cardiology | Admitting: Cardiology

## 2021-02-26 ENCOUNTER — Other Ambulatory Visit (HOSPITAL_COMMUNITY): Payer: Self-pay | Admitting: Cardiology

## 2021-02-26 DIAGNOSIS — I714 Abdominal aortic aneurysm, without rupture, unspecified: Secondary | ICD-10-CM

## 2021-06-11 DIAGNOSIS — H1711 Central corneal opacity, right eye: Secondary | ICD-10-CM | POA: Diagnosis not present

## 2021-09-02 NOTE — Progress Notes (Signed)
HPI: FU diastolic CHF/edema. Echo 7/15 showed normal LV function, grade 1 diastolic dysfunction; mild LAE. Exercise treadmill April 2016 negative. Abdominal ultrasound October 2022 showed 4.4 cm abdominal aortic aneurysm, bilateral common iliac artery ectasia with 1.8 cm right and 1.7 cm left.  Since I last saw him, he denies dyspnea, chest pain, palpitations or syncope.  Occasional minimal pedal edema.  Current Outpatient Medications  Medication Sig Dispense Refill   aspirin 81 MG tablet Take 1 tablet (81 mg total) by mouth daily.     beta carotene w/minerals (OCUVITE) tablet Take 1 tablet by mouth daily.     clindamycin (CLEOCIN) 150 MG capsule Take 600 mg by mouth as needed (prior to dental procedure).     clobetasol cream (TEMOVATE) 0.05 %      ferrous gluconate (FERGON) 324 MG tablet Take by mouth.     furosemide (LASIX) 40 MG tablet Take 2 tablets (80 mg total) by mouth daily. May add extra 40 mg , for weight gain (Patient taking differently: Take 40 mg by mouth daily. May add extra 40 mg , for weight gain) 90 tablet 6   losartan (COZAAR) 50 MG tablet Take 50 mg by mouth 2 (two) times daily.     meloxicam (MOBIC) 15 MG tablet Take 1 tablet by mouth daily.     potassium chloride SA (K-DUR,KLOR-CON) 20 MEQ tablet Take 1 tablet daily and take an extra dose if you take extra lasix. 60 tablet 1   tamsulosin (FLOMAX) 0.4 MG CAPS capsule Take 0.4 mg by mouth daily.      vitamin C (ASCORBIC ACID) 250 MG tablet 1 tablet     vitamin C (ASCORBIC ACID) 500 MG tablet Take 500 mg by mouth 2 (two) times daily.     rosuvastatin (CRESTOR) 20 MG tablet Take 1 tablet (20 mg total) by mouth daily. 90 tablet 3   No current facility-administered medications for this visit.     Past Medical History:  Diagnosis Date   Adrenal gland cyst ([facility])    Arthritis    BIL KNEES   Diastolic heart failure, NYHA class 2 ([facility])    Eczema    Herpesviral conjunctivitis    hx of in past causing need for corneal  transplant rt eye   History of kidney stones    HTN (hypertension) 05/27/2014    Past Surgical History:  Procedure Laterality Date   [place] /2001 /2012   L ING HERNIA REPAIR X 6   PILONIDAL CYST EXCISION  1974   TONSILLECTOMY     TOTAL HIP ARTHROPLASTY Right 05/22/2014   Procedure: RIGHT TOTAL HIP ARTHROPLASTY;  Surgeon: Tobi Bastos, MD;  Location: [facility];  Service: Orthopedics;  Laterality: Right;    Social History   Socioeconomic History   Marital status: Married    Spouse name: Not on file   Number of children: 2   Years of education: Not on file   Highest education level: Not on file  Occupational History    Employer: [employer]  Tobacco Use   Smoking status: Former    Types: Cigarettes    Quit date: 05/17/2011    Years since quitting: 10.3   Smokeless tobacco: Never  Vaping Use   Vaping Use: Never used  Substance and Sexual Activity   Alcohol use: Yes    Comment: OCCASIONAL   Drug use: No   Sexual activity: Never  Other Topics  Concern   Not on file  Social History Narrative   Not on file   Social Determinants of Health   Financial Resource Strain: Not on file  Food Insecurity: Not on file  Transportation Needs: Not on file  Physical Activity: Not on file  Stress: Not on file  Social Connections: Not on file  Intimate Partner Violence: Not on file    Family History  Problem Relation Age of Onset   Alzheimer's disease Mother    Cancer - Other Father        cancer of the uvula   Lung cancer Other     ROS: no fevers or chills, productive cough, hemoptysis, dysphasia, odynophagia, melena, hematochezia, dysuria, hematuria, rash, seizure activity, orthopnea, PND, pedal edema, claudication. Remaining systems are negative.  Physical Exam: Well-developed well-nourished in no acute distress.  Skin is warm and dry.  HEENT is normal.  Neck is supple.  Chest is clear to auscultation with normal  expansion.  Cardiovascular exam is regular rate and rhythm.  Abdominal exam nontender or distended. No masses palpated. Extremities show trace to 1+ edema. neuro grossly intact  ECG-sinus bradycardia at a rate of 50, no ST changes.  Personally reviewed  A/P  1 chronic diastolic congestive heart failure-he appears to be euvolemic.  We will continue Lasix.  Check potassium and renal function.   2 abdominal aortic aneurysm-we will arrange follow-up ultrasound 4/23.  3 hypertension-blood pressure controlled.  Continue present medical regimen.  4 hyperlipidemia-continue statin.  Check lipids and liver.  We will also check hemoglobin.  Kirk Ruths, MD

## 2021-09-03 ENCOUNTER — Ambulatory Visit (HOSPITAL_COMMUNITY)
Admission: RE | Admit: 2021-09-03 | Discharge: 2021-09-03 | Disposition: A | Discharge status: Home / Self Care | Payer: BC Managed Care – PPO | Source: Ambulatory Visit | Attending: Cardiovascular Disease | Admitting: Cardiovascular Disease

## 2021-09-03 ENCOUNTER — Other Ambulatory Visit: Payer: Self-pay

## 2021-09-03 DIAGNOSIS — I714 Abdominal aortic aneurysm, without rupture, unspecified: Secondary | ICD-10-CM | POA: Diagnosis not present

## 2021-09-03 DIAGNOSIS — I700 Atherosclerosis of aorta: Secondary | ICD-10-CM | POA: Diagnosis not present

## 2021-09-06 ENCOUNTER — Encounter: Payer: Self-pay | Admitting: *Deleted

## 2021-09-10 ENCOUNTER — Other Ambulatory Visit: Payer: Self-pay

## 2021-09-10 ENCOUNTER — Ambulatory Visit (INDEPENDENT_AMBULATORY_CARE_PROVIDER_SITE_OTHER): Payer: BC Managed Care – PPO | Admitting: Cardiology

## 2021-09-10 ENCOUNTER — Encounter: Payer: Self-pay | Admitting: Cardiology

## 2021-09-10 VITALS — BP 108/59 | HR 58 | Ht 71.0 in | Wt 243.6 lb

## 2021-09-10 DIAGNOSIS — I5032 Chronic diastolic (congestive) heart failure: Secondary | ICD-10-CM | POA: Diagnosis not present

## 2021-09-10 DIAGNOSIS — I10 Essential (primary) hypertension: Secondary | ICD-10-CM

## 2021-09-10 DIAGNOSIS — E7800 Pure hypercholesterolemia, unspecified: Secondary | ICD-10-CM

## 2021-09-10 DIAGNOSIS — I714 Abdominal aortic aneurysm, without rupture, unspecified: Secondary | ICD-10-CM | POA: Diagnosis not present

## 2021-09-10 NOTE — Patient Instructions (Signed)

## 2021-09-11 LAB — COMPREHENSIVE METABOLIC PANEL
ALT: 17 IU/L (ref 0–44)
AST: 17 IU/L (ref 0–40)
Albumin/Globulin Ratio: 2.6 — ABNORMAL HIGH (ref 1.2–2.2)
Albumin: 4.5 g/dL (ref 3.7–4.7)
Alkaline Phosphatase: 59 IU/L (ref 44–121)
BUN/Creatinine Ratio: 27 — ABNORMAL HIGH (ref 10–24)
BUN: 40 mg/dL — ABNORMAL HIGH (ref 8–27)
Bilirubin Total: 0.9 mg/dL (ref 0.0–1.2)
CO2: 23 mmol/L (ref 20–29)
Calcium: 9.2 mg/dL (ref 8.6–10.2)
Chloride: 104 mmol/L (ref 96–106)
Creatinine, Ser: 1.46 mg/dL — ABNORMAL HIGH (ref 0.76–1.27)
Globulin, Total: 1.7 g/dL (ref 1.5–4.5)
Glucose: 94 mg/dL (ref 70–99)
Potassium: 4.6 mmol/L (ref 3.5–5.2)
Sodium: 139 mmol/L (ref 134–144)
Total Protein: 6.2 g/dL (ref 6.0–8.5)
eGFR: 50 mL/min/{1.73_m2} — ABNORMAL LOW (ref >59)

## 2021-09-11 LAB — CBC
Hematocrit: 35.3 % — ABNORMAL LOW (ref 37.5–51.0)
Hemoglobin: 11.7 g/dL — ABNORMAL LOW (ref 13.0–17.7)
MCH: 31.4 pg (ref 26.6–33.0)
MCHC: 33.1 g/dL (ref 31.5–35.7)
MCV: 95 fL (ref 79–97)
Platelets: 88 10*3/uL — CL (ref 150–450)
RBC: 3.73 x10E6/uL — ABNORMAL LOW (ref 4.14–5.80)
RDW: 13.3 % (ref 11.6–15.4)
WBC: 14.5 10*3/uL — ABNORMAL HIGH (ref 3.4–10.8)

## 2021-09-11 LAB — LIPID PANEL
Chol/HDL Ratio: 2.3 ratio (ref 0.0–5.0)
Cholesterol, Total: 119 mg/dL (ref 100–199)
HDL: 52 mg/dL (ref >39)
LDL Chol Calc (NIH): 55 mg/dL (ref 0–99)
Triglycerides: 52 mg/dL (ref 0–149)
VLDL Cholesterol Cal: 12 mg/dL (ref 5–40)

## 2021-09-13 ENCOUNTER — Telehealth: Payer: Self-pay | Admitting: *Deleted

## 2021-09-13 DIAGNOSIS — R7989 Other specified abnormal findings of blood chemistry: Secondary | ICD-10-CM

## 2021-09-13 NOTE — Telephone Encounter (Signed)
pt aware of results  Referral placed

## 2021-09-13 NOTE — Telephone Encounter (Signed)
-----   Message from Lelon Perla, MD sent at 09/13/2021  7:09 AM EST ----- Would arrange hematology evaluation Kirk Ruths

## 2021-09-15 ENCOUNTER — Telehealth: Payer: Self-pay | Admitting: *Deleted

## 2021-09-16 ENCOUNTER — Telehealth: Payer: Self-pay | Admitting: *Deleted

## 2021-09-17 ENCOUNTER — Encounter: Payer: Self-pay | Admitting: *Deleted

## 2021-09-17 DIAGNOSIS — D696 Thrombocytopenia, unspecified: Secondary | ICD-10-CM

## 2021-09-17 NOTE — Progress Notes (Signed)
PATIENT NAVIGATOR PROGRESS NOTE  Name: [name] [name] Date: 09/17/2021 MRN: [id number]  DOB: 12-29-1945   Reason for visit:  Introductory phone call from referral  Comments:   Spoke with pt re: referral from Dr Stanford Breed to evaluate low platelet count. [name] stated that he had his yearly physical and that he has never had abnormal platelet counts. We discussed CBC results in general and have requested historical lab results from PCP office. He would like to be seen here for evaluation and appt made for 12/16 at 11:15 with Ned Card NP with labs performed same day. He is only available to come on Fridays due to work schedule    Time spent counseling/coordinating care: 15-30 minutes

## 2021-10-22 ENCOUNTER — Encounter: Payer: Self-pay | Admitting: Nurse Practitioner

## 2021-10-22 ENCOUNTER — Inpatient Hospital Stay: Payer: BC Managed Care – PPO | Attending: Nurse Practitioner | Admitting: Nurse Practitioner

## 2021-10-22 ENCOUNTER — Inpatient Hospital Stay: Payer: BC Managed Care – PPO

## 2021-10-22 ENCOUNTER — Other Ambulatory Visit: Payer: Self-pay

## 2021-10-22 VITALS — BP 120/56 | HR 60 | Temp 97.8°F | Resp 18 | Wt 244.0 lb

## 2021-10-22 DIAGNOSIS — D696 Thrombocytopenia, unspecified: Secondary | ICD-10-CM

## 2021-10-22 DIAGNOSIS — I714 Abdominal aortic aneurysm, without rupture, unspecified: Secondary | ICD-10-CM | POA: Diagnosis not present

## 2021-10-22 DIAGNOSIS — Z79899 Other long term (current) drug therapy: Secondary | ICD-10-CM | POA: Insufficient documentation

## 2021-10-22 DIAGNOSIS — E7800 Pure hypercholesterolemia, unspecified: Secondary | ICD-10-CM | POA: Diagnosis not present

## 2021-10-22 DIAGNOSIS — D72820 Lymphocytosis (symptomatic): Secondary | ICD-10-CM | POA: Insufficient documentation

## 2021-10-22 DIAGNOSIS — C9110 Chronic lymphocytic leukemia of B-cell type not having achieved remission: Secondary | ICD-10-CM | POA: Insufficient documentation

## 2021-10-22 DIAGNOSIS — D649 Anemia, unspecified: Secondary | ICD-10-CM | POA: Diagnosis not present

## 2021-10-22 DIAGNOSIS — I10 Essential (primary) hypertension: Secondary | ICD-10-CM | POA: Insufficient documentation

## 2021-10-22 LAB — CBC WITH DIFFERENTIAL (CANCER CENTER ONLY)
Abs Immature Granulocytes: 0.03 10*3/uL (ref 0.00–0.07)
Basophils Absolute: 0 10*3/uL (ref 0.0–0.1)
Basophils Relative: 0 %
Eosinophils Absolute: 0.2 10*3/uL (ref 0.0–0.5)
Eosinophils Relative: 1 %
HCT: 35.7 % — ABNORMAL LOW (ref 39.0–52.0)
Hemoglobin: 11.7 g/dL — ABNORMAL LOW (ref 13.0–17.0)
Immature Granulocytes: 0 %
Lymphocytes Relative: 70 %
Lymphs Abs: 11.5 10*3/uL — ABNORMAL HIGH (ref 0.7–4.0)
MCH: 31 pg (ref 26.0–34.0)
MCHC: 32.8 g/dL (ref 30.0–36.0)
MCV: 94.7 fL (ref 80.0–100.0)
Monocytes Absolute: 1.2 10*3/uL — ABNORMAL HIGH (ref 0.1–1.0)
Monocytes Relative: 7 %
Neutro Abs: 3.7 10*3/uL (ref 1.7–7.7)
Neutrophils Relative %: 22 %
Platelet Count: 100 10*3/uL — ABNORMAL LOW (ref 150–400)
RBC: 3.77 MIL/uL — ABNORMAL LOW (ref 4.22–5.81)
RDW: 14.1 % (ref 11.5–15.5)
WBC Count: 16.6 10*3/uL — ABNORMAL HIGH (ref 4.0–10.5)
nRBC: 0 % (ref 0.0–0.2)

## 2021-10-22 LAB — CMP (CANCER CENTER ONLY)
ALT: 18 U/L (ref 0–44)
AST: 20 U/L (ref 15–41)
Albumin: 4.5 g/dL (ref 3.5–5.0)
Alkaline Phosphatase: 46 U/L (ref 38–126)
Anion gap: 8 (ref 5–15)
BUN: 39 mg/dL — ABNORMAL HIGH (ref 8–23)
CO2: 24 mmol/L (ref 22–32)
Calcium: 9.4 mg/dL (ref 8.9–10.3)
Chloride: 108 mmol/L (ref 98–111)
Creatinine: 1.22 mg/dL (ref 0.61–1.24)
GFR, Estimated: >60 mL/min (ref >60)
Glucose, Bld: 100 mg/dL — ABNORMAL HIGH (ref 70–99)
Potassium: 4.5 mmol/L (ref 3.5–5.1)
Sodium: 140 mmol/L (ref 135–145)
Total Bilirubin: 1.4 mg/dL — ABNORMAL HIGH (ref 0.3–1.2)
Total Protein: 6.4 g/dL — ABNORMAL LOW (ref 6.5–8.1)

## 2021-10-22 LAB — SAVE SMEAR(SSMR), FOR PROVIDER SLIDE REVIEW

## 2021-10-22 LAB — LACTATE DEHYDROGENASE: LDH: 174 U/L (ref 98–192)

## 2021-10-22 NOTE — Progress Notes (Signed)
New Hematology/Oncology Consult   Requesting MD: Dr. Kirk Ruths  [phone]  Reason for Consult: Abnormal CBC  HPI: [name] [name] is a 75 year old man referred for evaluation of CBC abnormalities.  He saw Dr. Stanford Breed for follow-up of diastolic CHF/edema on 82/07/5620.  CBC on that date returned with a hemoglobin of 11.7, MCV 95, total white count 4.5, platelet count of 88,000 with platelet aggregation noted.    Comparison CBCs from several hospitalizations in July 2015-hemoglobin 8-12 range, white count 6.9 -22 range, platelet count 134,000-352,000 range.  He was initially hospitalized 05/22/2014 for right hip surgery.  The preoperative CBC from 05/16/2014 showed hemoglobin 12.2, white count 6.9 and platelet count 147,000.  He was discharged home 05/24/2014 with CBC at that time showing hemoglobin of 9.4, white count 11.7 and platelet count 143,000.  He was readmitted 05/27/2014 through 05/31/2014 with cardiorenal syndrome and 06/01/2014 through 06/04/2014 with acute renal failure secondary to acute urinary retention.   Past Medical History:  Diagnosis Date   Adrenal gland cyst (HCC)    Arthritis    BIL KNEES   Diastolic heart failure, NYHA class 2 (HCC)    Eczema    Herpesviral conjunctivitis    hx of in past causing need for corneal transplant rt eye   History of kidney stones    HTN (hypertension) 05/27/2014  :   Past Surgical History:  Procedure Laterality Date   [place] /2001 /2012   L ING HERNIA REPAIR X 6   PILONIDAL CYST EXCISION  1974   TONSILLECTOMY     TOTAL HIP ARTHROPLASTY Right 05/22/2014   Procedure: RIGHT TOTAL HIP ARTHROPLASTY;  Surgeon: Tobi Bastos, MD;  Location: [facility];  Service: Orthopedics;  Laterality: Right;  :   Current Outpatient Medications:    aspirin 81 MG tablet, Take 1 tablet (81 mg total) by mouth daily., Disp: , Rfl:    beta carotene w/minerals (OCUVITE) tablet, Take 1 tablet by mouth daily., Disp: , Rfl:     ferrous gluconate (FERGON) 324 MG tablet, Take by mouth., Disp: , Rfl:    furosemide (LASIX) 40 MG tablet, Take 2 tablets (80 mg total) by mouth daily. May add extra 40 mg , for weight gain (Patient taking differently: Take 40 mg by mouth daily. May add extra 40 mg , for weight gain), Disp: 90 tablet, Rfl: 6   losartan (COZAAR) 50 MG tablet, Take 50 mg by mouth 2 (two) times daily., Disp: , Rfl:    meloxicam (MOBIC) 15 MG tablet, Take 1 tablet by mouth daily., Disp: , Rfl:    Potassium Chloride ER 20 MEQ TBCR, Take 1 tablet by mouth daily., Disp: , Rfl:    potassium chloride SA (K-DUR,KLOR-CON) 20 MEQ tablet, Take 1 tablet daily and take an extra dose if you take extra lasix., Disp: 60 tablet, Rfl: 1   tamsulosin (FLOMAX) 0.4 MG CAPS capsule, Take 0.4 mg by mouth daily. , Disp: , Rfl:    vitamin C (ASCORBIC ACID) 250 MG tablet, 1 tablet, Disp: , Rfl:    vitamin C (ASCORBIC ACID) 500 MG tablet, Take 500 mg by mouth 2 (two) times daily., Disp: , Rfl:    clindamycin (CLEOCIN) 150 MG capsule, Take 600 mg by mouth as needed (prior to dental procedure). (Patient not taking: Reported on 10/22/2021), Disp: , Rfl:    clobetasol cream (TEMOVATE) 0.05 %, , Disp: , Rfl:    rosuvastatin (CRESTOR) 20 MG tablet, Take 1  tablet (20 mg total) by mouth daily., Disp: 90 tablet, Rfl: 3:    Allergies  Allergen Reactions   Prednisone     Leg cramps   Penicillins Rash    FH: Father and paternal grandfather deceased with head and neck cancer.  SOCIAL HISTORY: He lives in [place].  He is married.  He has 2 children reported to be in good health.  He works as a English as a second language teacher.  He reports exposure to acids, metals, zinc.  Review of Systems: No fevers or sweats.  He has a good appetite.  No weight loss.  Bilateral knee pain.  No bleeding.  Specifically no epistaxis, gum bleeding, bloody or black bowel movements, hematuria.  No unusual headaches.  No visual disturbance.  History of herpes conjunctivitis right eye  requiring corneal transplant.  No dysphagia.  No cough or shortness of breath.  No chest pain.  No change in bowel habits.  No urinary symptoms.  No numbness or tingling in the hands or feet.  Physical Exam:  Blood pressure (!) 120/56, pulse 60, temperature 97.8 F (36.6 C), temperature source Tympanic, resp. rate 18, weight 244 lb (110.7 kg), SpO2 98 %.  HEENT: No thrush or ulcers. Lungs: Lungs clear bilaterally. Cardiac: Regular rate and rhythm. Abdomen: No hepatosplenomegaly. Vascular: Trace edema lower leg bilaterally.  Chronic stasis change lower leg bilaterally. Lymph nodes: No palpable cervical, supraclavicular or inguinal lymph nodes.  Bilateral axillary lymph node versus prominent fat pad. He may have a 1.5 cm left axillary node.  Neurologic: Alert and oriented. Skin: Warm and dry.  LABS:   Recent Labs    10/22/21 1101  WBC 16.6*  HGB 11.7*  HCT 35.7*  PLT 100*  Peripheral blood smear: White blood cells-monotonous population mature appearing lymphoid cells, also mature appearing neutrophils and monocytes; platelets appear mildly decreased; few teardrops, ovalocytes, polychromasia not increased.  RADIOLOGY:  No results found.  Assessment and Plan:   Anemia, thrombocytopenia, lymphocytosis Diastolic CHF followed by Dr. Stanford Breed Hypertension Abdominal aortic aneurysm Hypercholesterolemia  Mr. [name] was referred for evaluation of anemia, thrombocytopenia, lymphocytosis.  He appears to have CLL.  Dr. Benay Spice reviewed the diagnosis, prognosis and treatment options with him at today's visit.  He has no B symptoms and no bulky lymphadenopathy.  He understands there is no indication for treatment at present.  We will obtain confirmatory testing with peripheral blood flow cytometry.  We will also obtain a CLL FISH panel, chemistry panel, LDH.  He understands he is at increased risk for infections.  We discussed the importance of staying up-to-date on vaccines.  He may need the  Pneumovax vaccine.  We also discussed Evusheld.  He will return for a CBC and follow-up visit in 3 months.  I reviewed the above with his wife by phone.  She will make sure he gets the Pneumovax vaccine.  They will let us know about Evusheld.  Patient seen with Dr. Benay Spice.    Ned Card, NP 10/22/2021, 12:04 PM   This was a shared visit with Ned Card.  Mr. Clydene Fake was interviewed and examined.  I reviewed the peripheral blood smear.  He is referred for evaluation of anemia/thrombocytopenia and lymphocytosis.  The peripheral blood smear is consistent with a diagnosis of CLL.  We will submit the peripheral blood for flow cytometry to confirm the CLL diagnosis.  There is no indication for treating the CLL at present.  We discussed indications for treating CLL with Mr Dieter.  He is an increased risk for  infection.  He will seek medical attention for symptoms of infection.  He will remain up-to-date on the influenza, COVID-19, and pneumococcal vaccines.  I was present for greater than 50% of today's visit.  I performed medical decision making.

## 2021-10-29 ENCOUNTER — Other Ambulatory Visit: Payer: BC Managed Care – PPO

## 2021-10-29 ENCOUNTER — Inpatient Hospital Stay: Payer: BC Managed Care – PPO

## 2021-10-29 ENCOUNTER — Other Ambulatory Visit: Payer: Self-pay

## 2021-10-29 DIAGNOSIS — D72820 Lymphocytosis (symptomatic): Secondary | ICD-10-CM | POA: Diagnosis not present

## 2021-10-29 DIAGNOSIS — C9110 Chronic lymphocytic leukemia of B-cell type not having achieved remission: Secondary | ICD-10-CM | POA: Diagnosis not present

## 2021-10-29 DIAGNOSIS — D649 Anemia, unspecified: Secondary | ICD-10-CM

## 2021-10-29 DIAGNOSIS — D696 Thrombocytopenia, unspecified: Secondary | ICD-10-CM

## 2021-10-29 DIAGNOSIS — I10 Essential (primary) hypertension: Secondary | ICD-10-CM | POA: Diagnosis not present

## 2021-10-29 DIAGNOSIS — I714 Abdominal aortic aneurysm, without rupture, unspecified: Secondary | ICD-10-CM | POA: Diagnosis not present

## 2021-10-29 LAB — MULTIPLE MYELOMA PANEL, SERUM
Albumin SerPl Elph-Mcnc: 4 g/dL (ref 2.9–4.4)
Albumin/Glob SerPl: 1.7 (ref 0.7–1.7)
Alpha 1: 0.2 g/dL (ref 0.0–0.4)
Alpha2 Glob SerPl Elph-Mcnc: 0.8 g/dL (ref 0.4–1.0)
B-Globulin SerPl Elph-Mcnc: 1 g/dL (ref 0.7–1.3)
Gamma Glob SerPl Elph-Mcnc: 0.5 g/dL (ref 0.4–1.8)
Globulin, Total: 2.4 g/dL (ref 2.2–3.9)
IgA: 85 mg/dL (ref 61–437)
IgG (Immunoglobin G), Serum: 582 mg/dL — ABNORMAL LOW (ref 603–1613)
IgM (Immunoglobulin M), Srm: 10 mg/dL — ABNORMAL LOW (ref 15–143)
Total Protein ELP: 6.4 g/dL (ref 6.0–8.5)

## 2021-11-11 ENCOUNTER — Other Ambulatory Visit: Payer: Self-pay | Admitting: Nurse Practitioner

## 2021-11-11 DIAGNOSIS — D72820 Lymphocytosis (symptomatic): Secondary | ICD-10-CM

## 2021-11-15 LAB — SURGICAL PATHOLOGY

## 2021-11-16 LAB — FLOW CYTOMETRY

## 2021-11-17 ENCOUNTER — Telehealth: Payer: Self-pay | Admitting: Cardiology

## 2021-11-17 ENCOUNTER — Telehealth: Payer: Self-pay | Admitting: *Deleted

## 2021-11-17 NOTE — Telephone Encounter (Signed)
Was informed on 11/16/21 by lab that [place] sample was collected on 12/23, but not received for testing until 12/27, which made sample too old to test. He has not been charged for the test. Dr. Benay Spice notified and feels OK to wait to re-collect on 01/18/22 visit. Patient notified and he agrees to wait. Also wanted MD aware that his brother, who is a physician told him that their father had CLL as well and never had treatment. MD notified.

## 2021-11-17 NOTE — Telephone Encounter (Signed)
Called [t gave him the number for [facility] [facility] to find a PCP. He thanked me for calling him.

## 2021-11-17 NOTE — Telephone Encounter (Signed)
Patient would like to know if there is a PCP Dr. Stanford Breed can recommend to him.

## 2021-11-21 ENCOUNTER — Encounter: Payer: Self-pay | Admitting: Nurse Practitioner

## 2021-12-10 DIAGNOSIS — M1990 Unspecified osteoarthritis, unspecified site: Secondary | ICD-10-CM | POA: Diagnosis not present

## 2021-12-10 DIAGNOSIS — E785 Hyperlipidemia, unspecified: Secondary | ICD-10-CM | POA: Diagnosis not present

## 2021-12-10 DIAGNOSIS — C9110 Chronic lymphocytic leukemia of B-cell type not having achieved remission: Secondary | ICD-10-CM | POA: Diagnosis not present

## 2021-12-10 DIAGNOSIS — I5030 Unspecified diastolic (congestive) heart failure: Secondary | ICD-10-CM | POA: Diagnosis not present

## 2021-12-10 DIAGNOSIS — I714 Abdominal aortic aneurysm, without rupture, unspecified: Secondary | ICD-10-CM | POA: Diagnosis not present

## 2021-12-10 DIAGNOSIS — N183 Chronic kidney disease, stage 3 unspecified: Secondary | ICD-10-CM | POA: Diagnosis not present

## 2021-12-10 DIAGNOSIS — I10 Essential (primary) hypertension: Secondary | ICD-10-CM | POA: Diagnosis not present

## 2021-12-10 DIAGNOSIS — R7303 Prediabetes: Secondary | ICD-10-CM | POA: Diagnosis not present

## 2021-12-31 ENCOUNTER — Encounter: Payer: Self-pay | Admitting: Podiatry

## 2021-12-31 ENCOUNTER — Ambulatory Visit (INDEPENDENT_AMBULATORY_CARE_PROVIDER_SITE_OTHER): Payer: BC Managed Care – PPO | Admitting: Podiatry

## 2021-12-31 ENCOUNTER — Other Ambulatory Visit: Payer: Self-pay

## 2021-12-31 DIAGNOSIS — L600 Ingrowing nail: Secondary | ICD-10-CM | POA: Diagnosis not present

## 2021-12-31 DIAGNOSIS — B351 Tinea unguium: Secondary | ICD-10-CM | POA: Diagnosis not present

## 2021-12-31 NOTE — Progress Notes (Signed)
Subjective:   Patient ID: [name] [name], male   DOB: 76 y.o.   MRN: [id number]   HPI Patient presents stating he has severe damage of the left big toenail and states that it is partially loose and he like it removed.  The right hallux nails also thickened several other nails are thickened but they do not bother him to the same degree and they are not traumatized the way this nail was.  Patient does not smoke does like to be active   Review of Systems  All other systems reviewed and are negative.      Objective:  Physical Exam Vitals and nursing note reviewed.  Constitutional:      Appearance: He is well-developed.  Pulmonary:     Effort: Pulmonary effort is normal.  Musculoskeletal:        General: Normal range of motion.  Skin:    General: Skin is warm.  Neurological:     Mental Status: He is alert.    Neurovascular status found to be intact muscle strength was found to be adequate range of motion adequate.  Patient is noted to have a severely thickened dystrophic painful left hallux nail partially detached and the right hallux nail is also partially thickened along with other nails that have some thickness.  Patient has good digital perfusion well oriented x3     Assessment:  Severe nail disease with trauma and partial detachment left hallux nail with thickened damaged nailbeds on other nails     Plan:  H&P reviewed all conditions and I do think the left hallux nail should be removed due to its partial detachment and have it done permanently due to the underlying damage to the bed and may have to consider this for other nails.  I educated him on this versus fungus I did debride the rest of the nails temporarily and for this when I did allow him to sign consent form for removal of nail.  I infiltrated the left hallux 60 mg like Marcaine mixture sterile prep done using sterile instrumentation I remove the nailbed I move the matrix applied phenol 5 applications 30 seconds followed by  alcohol lavage sterile dressing gave instructions on soaks and to take dressing off earlier if needed but if not leave on 24 hours and encouraged him to call questions concerns and the possibility exists for removal of adjacent nails in future

## 2021-12-31 NOTE — Patient Instructions (Signed)

## 2022-01-11 ENCOUNTER — Telehealth: Payer: Self-pay | Admitting: Podiatry

## 2022-01-11 NOTE — Telephone Encounter (Signed)
Patient states toe is not healing properly,  there is a lot of blood and drainage when he changes the bandage.  His wife checked it its not infected (she is a Marine scientist).  Please advise next steps

## 2022-01-21 ENCOUNTER — Other Ambulatory Visit: Payer: Self-pay

## 2022-01-21 ENCOUNTER — Inpatient Hospital Stay: Payer: BC Managed Care – PPO

## 2022-01-21 ENCOUNTER — Inpatient Hospital Stay: Payer: BC Managed Care – PPO | Attending: Nurse Practitioner | Admitting: Oncology

## 2022-01-21 VITALS — BP 134/49 | HR 70 | Temp 98.1°F | Resp 18 | Ht 71.0 in | Wt 240.6 lb

## 2022-01-21 DIAGNOSIS — I5030 Unspecified diastolic (congestive) heart failure: Secondary | ICD-10-CM | POA: Insufficient documentation

## 2022-01-21 DIAGNOSIS — D72820 Lymphocytosis (symptomatic): Secondary | ICD-10-CM

## 2022-01-21 DIAGNOSIS — C9110 Chronic lymphocytic leukemia of B-cell type not having achieved remission: Secondary | ICD-10-CM | POA: Insufficient documentation

## 2022-01-21 DIAGNOSIS — I110 Hypertensive heart disease with heart failure: Secondary | ICD-10-CM | POA: Diagnosis not present

## 2022-01-21 DIAGNOSIS — E7800 Pure hypercholesterolemia, unspecified: Secondary | ICD-10-CM | POA: Insufficient documentation

## 2022-01-21 DIAGNOSIS — I714 Abdominal aortic aneurysm, without rupture, unspecified: Secondary | ICD-10-CM | POA: Insufficient documentation

## 2022-01-21 DIAGNOSIS — D696 Thrombocytopenia, unspecified: Secondary | ICD-10-CM

## 2022-01-21 DIAGNOSIS — D649 Anemia, unspecified: Secondary | ICD-10-CM

## 2022-01-21 LAB — CBC WITH DIFFERENTIAL (CANCER CENTER ONLY)
Abs Immature Granulocytes: 0.02 K/uL (ref 0.00–0.07)
Basophils Absolute: 0.1 K/uL (ref 0.0–0.1)
Basophils Relative: 1 %
Eosinophils Absolute: 0.2 K/uL (ref 0.0–0.5)
Eosinophils Relative: 2 %
HCT: 37 % — ABNORMAL LOW (ref 39.0–52.0)
Hemoglobin: 12 g/dL — ABNORMAL LOW (ref 13.0–17.0)
Immature Granulocytes: 0 %
Lymphocytes Relative: 70 %
Lymphs Abs: 8.8 K/uL — ABNORMAL HIGH (ref 0.7–4.0)
MCH: 30.8 pg (ref 26.0–34.0)
MCHC: 32.4 g/dL (ref 30.0–36.0)
MCV: 94.9 fL (ref 80.0–100.0)
Monocytes Absolute: 0.7 K/uL (ref 0.1–1.0)
Monocytes Relative: 5 %
Neutro Abs: 2.7 K/uL (ref 1.7–7.7)
Neutrophils Relative %: 22 %
Platelet Count: 100 K/uL — ABNORMAL LOW (ref 150–400)
RBC: 3.9 MIL/uL — ABNORMAL LOW (ref 4.22–5.81)
RDW: 13.9 % (ref 11.5–15.5)
WBC Count: 12.5 K/uL — ABNORMAL HIGH (ref 4.0–10.5)
nRBC: 0 % (ref 0.0–0.2)

## 2022-01-21 NOTE — Progress Notes (Signed)
?  [facility] ?OFFICE PROGRESS NOTE ? ? ?Diagnosis: CLL ? ?INTERVAL HISTORY:  ? ?Mr Wence returns as scheduled.  Good appetite.  No fever or night sweats.  No new complaint. ? ?Objective: ? ?Vital signs in last 24 hours: ? ?Blood pressure (!) 134/49, pulse 70, temperature 98.1 ?F (36.7 ?C), temperature source Oral, resp. rate 18, height '5\' 11"'$  (1.803 m), weight 240 lb 9.6 oz (109.1 kg), SpO2 98 %. ?  ? ?Lymphatics: No cervical, supraclavicular, axillary, or inguinal nodes.  Prominent right greater than left axillary fat pad ?Resp: Lungs clear bilaterally ?Cardio: Regular rate and rhythm ?GI: No hepatosplenomegaly ?Vascular: Stasis change and pitting edema at the left greater than right lower leg ? ? ?Lab Results: ? ?Lab Results  ?Component Value Date  ? WBC 16.6 (H) 10/22/2021  ? HGB 11.7 (L) 10/22/2021  ? HCT 35.7 (L) 10/22/2021  ? MCV 94.7 10/22/2021  ? PLT 100 (L) 10/22/2021  ? NEUTROABS 3.7 10/22/2021  ? ? ?CMP  ?Lab Results  ?Component Value Date  ? NA 140 10/22/2021  ? K 4.5 10/22/2021  ? CL 108 10/22/2021  ? CO2 24 10/22/2021  ? GLUCOSE 100 (H) 10/22/2021  ? BUN 39 (H) 10/22/2021  ? CREATININE 1.22 10/22/2021  ? CALCIUM 9.4 10/22/2021  ? PROT 6.4 (L) 10/22/2021  ? ALBUMIN 4.5 10/22/2021  ? AST 20 10/22/2021  ? ALT 18 10/22/2021  ? ALKPHOS 46 10/22/2021  ? BILITOT 1.4 (H) 10/22/2021  ? GFRNONAA >60 10/22/2021  ? GFRAA 67 10/31/2019  ? ? ?No results found for: CEA1, CEA, [id number], CA125 ? ?Lab Results  ?Component Value Date  ? INR 1.71 (H) 05/27/2014  ? LABPROT 20.1 (H) 05/27/2014  ? ? ?Imaging: ? ?No results found. ? ?Medications: I have reviewed the patient's current medications. ? ? ?Assessment/Plan: ?Chronic lymphocytic leukemia presenting with anemia, thrombocytopenia, lymphocytosis ?Peripheral blood flow cytometry 10/29/2021-monoclonal B-cell population with expression of CD5CD19, CD200, lambda light chain ?Diastolic CHF followed by Dr. Stanford Breed ?Hypertension ?Abdominal aortic  aneurysm ?Hypercholesterolemia ?Family history (father) of CLL ? ? ? ?Disposition: ?Mr Hume is stable from a hematologic standpoint.  He has mild anemia/thrombocytopenia and lymphocytosis.  The clinical presentation and flow cytometry are consistent with a diagnosis of CLL.  There is no indication for treating the CLL.  We discussed indications for treatment. ?He will remain up-to-date on influenza and pneumonia vaccines.  He will return for an office visit in 6 months.  We submitted a peripheral blood for a CLL molecular prognostic panel today. ? ?Betsy Coder, MD ? ?01/21/2022  ?11:18 AM ? ? ?

## 2022-01-26 ENCOUNTER — Other Ambulatory Visit: Payer: Self-pay | Admitting: *Deleted

## 2022-01-26 DIAGNOSIS — I714 Abdominal aortic aneurysm, without rupture, unspecified: Secondary | ICD-10-CM

## 2022-01-28 LAB — FISH,CLL PROGNOSTIC PANEL

## 2022-01-31 ENCOUNTER — Telehealth: Payer: Self-pay

## 2022-01-31 NOTE — Telephone Encounter (Signed)
-----   Message from Ladell Pier, MD sent at 01/28/2022  4:28 PM EDT ----- ?Please call patient, the CLL molecular panel does not show alterations associated with a poor prognosis, follow-up as scheduled ? ?

## 2022-01-31 NOTE — Telephone Encounter (Signed)
Patient gave verbal understanding and denied any further questions ?

## 2022-03-04 ENCOUNTER — Ambulatory Visit (HOSPITAL_COMMUNITY)
Admission: RE | Admit: 2022-03-04 | Discharge: 2022-03-04 | Disposition: A | Discharge status: Home / Self Care | Payer: BC Managed Care – PPO | Source: Ambulatory Visit | Attending: Cardiology | Admitting: Cardiology

## 2022-03-04 DIAGNOSIS — I7142 Juxtarenal abdominal aortic aneurysm, without rupture: Secondary | ICD-10-CM | POA: Diagnosis not present

## 2022-03-04 DIAGNOSIS — I714 Abdominal aortic aneurysm, without rupture, unspecified: Secondary | ICD-10-CM | POA: Diagnosis not present

## 2022-03-10 ENCOUNTER — Encounter: Payer: Self-pay | Admitting: *Deleted

## 2022-05-06 DIAGNOSIS — Z791 Long term (current) use of non-steroidal anti-inflammatories (NSAID): Secondary | ICD-10-CM | POA: Diagnosis not present

## 2022-05-06 DIAGNOSIS — N1831 Chronic kidney disease, stage 3a: Secondary | ICD-10-CM | POA: Diagnosis not present

## 2022-05-06 DIAGNOSIS — R031 Nonspecific low blood-pressure reading: Secondary | ICD-10-CM | POA: Diagnosis not present

## 2022-05-06 DIAGNOSIS — R7303 Prediabetes: Secondary | ICD-10-CM | POA: Diagnosis not present

## 2022-05-06 DIAGNOSIS — M1710 Unilateral primary osteoarthritis, unspecified knee: Secondary | ICD-10-CM | POA: Diagnosis not present

## 2022-05-13 ENCOUNTER — Telehealth: Payer: Self-pay | Admitting: Cardiology

## 2022-05-13 ENCOUNTER — Other Ambulatory Visit: Payer: Self-pay

## 2022-05-13 MED ORDER — LOSARTAN POTASSIUM 25 MG PO TABS: 25.0000 mg | ORAL_TABLET | Freq: Two times a day (BID) | ORAL | Status: AC

## 2022-05-13 NOTE — Telephone Encounter (Signed)
Office called to say his bp was low and NP Amedeo Plenty lower his dosage for losartan (COZAAR) 50 MG tablet to '25mg'$  twice a day. Please advise

## 2022-05-13 NOTE — Telephone Encounter (Signed)
Verified losartan order with Candice at [facility] in [place]. Windy Carina, NP changed losartan to '25mg'$  daily. On 05/06/22 patient's BP in office was 85/45, then sitting 110/43, P 68. Verified this prescription with patient's [facility].

## 2022-05-13 NOTE — Telephone Encounter (Signed)
Spoke with pt, aware dr Stanford Breed agrees with med change

## 2022-06-24 ENCOUNTER — Inpatient Hospital Stay (HOSPITAL_BASED_OUTPATIENT_CLINIC_OR_DEPARTMENT_OTHER): Payer: BLUE CROSS/BLUE SHIELD | Admitting: Oncology

## 2022-06-24 ENCOUNTER — Inpatient Hospital Stay: Payer: BLUE CROSS/BLUE SHIELD | Attending: Oncology

## 2022-06-24 VITALS — BP 130/58 | HR 67 | Temp 98.2°F | Resp 20 | Ht 71.0 in | Wt 239.2 lb

## 2022-06-24 DIAGNOSIS — C9110 Chronic lymphocytic leukemia of B-cell type not having achieved remission: Secondary | ICD-10-CM

## 2022-06-24 DIAGNOSIS — D72820 Lymphocytosis (symptomatic): Secondary | ICD-10-CM

## 2022-06-24 LAB — CBC WITH DIFFERENTIAL (CANCER CENTER ONLY)
Abs Immature Granulocytes: 0.03 10*3/uL (ref 0.00–0.07)
Basophils Absolute: 0.2 10*3/uL — ABNORMAL HIGH (ref 0.0–0.1)
Basophils Relative: 1 %
Eosinophils Absolute: 0.2 10*3/uL (ref 0.0–0.5)
Eosinophils Relative: 1 %
HCT: 34.7 % — ABNORMAL LOW (ref 39.0–52.0)
Hemoglobin: 11.6 g/dL — ABNORMAL LOW (ref 13.0–17.0)
Immature Granulocytes: 0 %
Lymphocytes Relative: 73 %
Lymphs Abs: 10.2 10*3/uL — ABNORMAL HIGH (ref 0.7–4.0)
MCH: 31.9 pg (ref 26.0–34.0)
MCHC: 33.4 g/dL (ref 30.0–36.0)
MCV: 95.3 fL (ref 80.0–100.0)
Monocytes Absolute: 0.5 10*3/uL (ref 0.1–1.0)
Monocytes Relative: 4 %
Neutro Abs: 2.9 10*3/uL (ref 1.7–7.7)
Neutrophils Relative %: 21 %
Platelet Count: 89 10*3/uL — ABNORMAL LOW (ref 150–400)
RBC: 3.64 MIL/uL — ABNORMAL LOW (ref 4.22–5.81)
RDW: 14.2 % (ref 11.5–15.5)
WBC Count: 13.9 10*3/uL — ABNORMAL HIGH (ref 4.0–10.5)
nRBC: 0 % (ref 0.0–0.2)

## 2022-06-24 NOTE — Progress Notes (Signed)
  [place] OFFICE PROGRESS NOTE   Diagnosis: CLL  INTERVAL HISTORY:   Mr. Kroon returns as scheduled.  He feels well.  No fever, night sweats, anorexia, or recent infection.  Objective:  Vital signs in last 24 hours:  Blood pressure (!) 130/58, pulse 67, temperature 98.2 F (36.8 C), resp. rate 20, height $RemoveBe'5\' 11"'uYsHLtJhN$  (1.803 m), weight 239 lb 3.2 oz (108.5 kg), SpO2 98 %.    Lymphatics: No cervical, supraclavicular, axillary, or inguinal nodes.  Prominent bilateral axillary fat pads. Resp: Lungs clear bilaterally Cardio: Regular rate and rhythm GI: No hepatosplenomegaly Vascular: Trace lower leg edema bilaterally   Lab Results:  Lab Results  Component Value Date   WBC 12.5 (H) 01/21/2022   HGB 12.0 (L) 01/21/2022   HCT 37.0 (L) 01/21/2022   MCV 94.9 01/21/2022   PLT 100 (L) 01/21/2022   NEUTROABS 2.7 01/21/2022    CMP  Lab Results  Component Value Date   NA 140 10/22/2021   K 4.5 10/22/2021   CL 108 10/22/2021   CO2 24 10/22/2021   GLUCOSE 100 (H) 10/22/2021   BUN 39 (H) 10/22/2021   CREATININE 1.22 10/22/2021   CALCIUM 9.4 10/22/2021   PROT 6.4 (L) 10/22/2021   ALBUMIN 4.5 10/22/2021   AST 20 10/22/2021   ALT 18 10/22/2021   ALKPHOS 46 10/22/2021   BILITOT 1.4 (H) 10/22/2021   GFRNONAA >60 10/22/2021   GFRAA 67 10/31/2019   Medications: I have reviewed the patient's current medications.   Assessment/Plan: Chronic lymphocytic leukemia presenting with anemia, thrombocytopenia, lymphocytosis Peripheral blood flow cytometry 10/29/2021-monoclonal B-cell population with expression of CD5CD19, CD200, lambda light chain CLL FISH prognostic panel 01/21/2022-deletion 13q14.3, no p53, trisomy 12, or ATM alteration Diastolic CHF followed by Dr. Stanford Breed Hypertension Abdominal aortic aneurysm Hypercholesterolemia Family history (father) of CLL    Disposition: Mr Pinson appears stable.  He is asymptomatic from CLL.  He is stable from a hematologic  standpoint.  The plan is to continue observation.  He will call for bleeding or other new symptoms.  He will return for an office and lab visit in 6 months. He should remain up-to-date on influenza, COVID-19, and pneumonia vaccines. Betsy Coder, MD  06/24/2022  11:47 AM

## 2022-06-28 ENCOUNTER — Other Ambulatory Visit: Payer: Self-pay | Admitting: *Deleted

## 2022-06-28 DIAGNOSIS — C9110 Chronic lymphocytic leukemia of B-cell type not having achieved remission: Secondary | ICD-10-CM

## 2022-06-28 NOTE — Progress Notes (Signed)
6 month lab orders placed.

## 2022-08-29 ENCOUNTER — Other Ambulatory Visit: Payer: Self-pay

## 2022-08-29 ENCOUNTER — Telehealth: Payer: Self-pay | Admitting: Cardiology

## 2022-08-29 DIAGNOSIS — I714 Abdominal aortic aneurysm, without rupture, unspecified: Secondary | ICD-10-CM

## 2022-08-29 NOTE — Telephone Encounter (Signed)
Patient is requesting an order for an AAA Duplex to be completed prior to 3/14 appointment with Dr. Stanford Breed.

## 2022-08-29 NOTE — Telephone Encounter (Signed)
Called patient spoke with wife, advised order was placed, advised wife they would call to get him scheduled.  Wife verbalized understanding

## 2022-09-09 ENCOUNTER — Ambulatory Visit (HOSPITAL_COMMUNITY)
Admission: RE | Admit: 2022-09-09 | Discharge: 2022-09-09 | Disposition: A | Discharge status: Home / Self Care | Payer: BLUE CROSS/BLUE SHIELD | Source: Ambulatory Visit | Attending: Cardiology | Admitting: Cardiology

## 2022-09-09 DIAGNOSIS — I714 Abdominal aortic aneurysm, without rupture, unspecified: Secondary | ICD-10-CM | POA: Insufficient documentation

## 2022-09-09 DIAGNOSIS — I7143 Infrarenal abdominal aortic aneurysm, without rupture: Secondary | ICD-10-CM | POA: Diagnosis not present

## 2022-09-14 ENCOUNTER — Encounter: Payer: Self-pay | Admitting: *Deleted

## 2022-09-16 DIAGNOSIS — H1711 Central corneal opacity, right eye: Secondary | ICD-10-CM | POA: Diagnosis not present

## 2022-09-23 DIAGNOSIS — I714 Abdominal aortic aneurysm, without rupture, unspecified: Secondary | ICD-10-CM | POA: Diagnosis not present

## 2022-09-23 DIAGNOSIS — M1990 Unspecified osteoarthritis, unspecified site: Secondary | ICD-10-CM | POA: Diagnosis not present

## 2022-09-23 DIAGNOSIS — I10 Essential (primary) hypertension: Secondary | ICD-10-CM | POA: Diagnosis not present

## 2022-09-23 DIAGNOSIS — N183 Chronic kidney disease, stage 3 unspecified: Secondary | ICD-10-CM | POA: Diagnosis not present

## 2022-09-23 DIAGNOSIS — C9110 Chronic lymphocytic leukemia of B-cell type not having achieved remission: Secondary | ICD-10-CM | POA: Diagnosis not present

## 2022-09-23 DIAGNOSIS — R17 Unspecified jaundice: Secondary | ICD-10-CM | POA: Diagnosis not present

## 2022-12-23 ENCOUNTER — Inpatient Hospital Stay (HOSPITAL_BASED_OUTPATIENT_CLINIC_OR_DEPARTMENT_OTHER): Payer: BLUE CROSS/BLUE SHIELD | Admitting: Oncology

## 2022-12-23 ENCOUNTER — Inpatient Hospital Stay: Payer: BLUE CROSS/BLUE SHIELD | Attending: Oncology

## 2022-12-23 ENCOUNTER — Encounter: Payer: Self-pay | Admitting: Oncology

## 2022-12-23 VITALS — BP 127/58 | HR 69 | Temp 98.1°F | Resp 18 | Ht 71.0 in | Wt 242.0 lb

## 2022-12-23 DIAGNOSIS — E7800 Pure hypercholesterolemia, unspecified: Secondary | ICD-10-CM | POA: Diagnosis not present

## 2022-12-23 DIAGNOSIS — C9110 Chronic lymphocytic leukemia of B-cell type not having achieved remission: Secondary | ICD-10-CM | POA: Diagnosis not present

## 2022-12-23 DIAGNOSIS — I5030 Unspecified diastolic (congestive) heart failure: Secondary | ICD-10-CM | POA: Insufficient documentation

## 2022-12-23 DIAGNOSIS — D649 Anemia, unspecified: Secondary | ICD-10-CM | POA: Insufficient documentation

## 2022-12-23 DIAGNOSIS — I110 Hypertensive heart disease with heart failure: Secondary | ICD-10-CM | POA: Diagnosis not present

## 2022-12-23 DIAGNOSIS — I714 Abdominal aortic aneurysm, without rupture, unspecified: Secondary | ICD-10-CM | POA: Insufficient documentation

## 2022-12-23 DIAGNOSIS — D696 Thrombocytopenia, unspecified: Secondary | ICD-10-CM | POA: Insufficient documentation

## 2022-12-23 LAB — CBC WITH DIFFERENTIAL (CANCER CENTER ONLY)
Abs Immature Granulocytes: 0.03 10*3/uL (ref 0.00–0.07)
Basophils Absolute: 0.2 10*3/uL — ABNORMAL HIGH (ref 0.0–0.1)
Basophils Relative: 1 %
Eosinophils Absolute: 0.2 10*3/uL (ref 0.0–0.5)
Eosinophils Relative: 2 %
HCT: 36.7 % — ABNORMAL LOW (ref 39.0–52.0)
Hemoglobin: 12 g/dL — ABNORMAL LOW (ref 13.0–17.0)
Immature Granulocytes: 0 %
Lymphocytes Relative: 73 %
Lymphs Abs: 9.7 10*3/uL — ABNORMAL HIGH (ref 0.7–4.0)
MCH: 31.3 pg (ref 26.0–34.0)
MCHC: 32.7 g/dL (ref 30.0–36.0)
MCV: 95.8 fL (ref 80.0–100.0)
Monocytes Absolute: 0.6 10*3/uL (ref 0.1–1.0)
Monocytes Relative: 5 %
Neutro Abs: 2.5 10*3/uL (ref 1.7–7.7)
Neutrophils Relative %: 19 %
Platelet Count: 80 10*3/uL — ABNORMAL LOW (ref 150–400)
RBC: 3.83 MIL/uL — ABNORMAL LOW (ref 4.22–5.81)
RDW: 14.4 % (ref 11.5–15.5)
WBC Count: 13.3 10*3/uL — ABNORMAL HIGH (ref 4.0–10.5)
nRBC: 0 % (ref 0.0–0.2)

## 2022-12-23 NOTE — Progress Notes (Signed)
  [facility] OFFICE PROGRESS NOTE   Diagnosis: CLL  INTERVAL HISTORY:   Mr Chagolla returns as scheduled.  He feels well.  No fever, night sweats, or anorexia.  No recent infection.  He reports being up-to-date on influenza and pneumonia vaccines.  Objective:  Vital signs in last 24 hours:  Blood pressure (!) 127/58, pulse 69, temperature 98.1 F (36.7 C), temperature source Oral, resp. rate 18, height 5' 11"$  (1.803 m), weight 242 lb (109.8 kg), SpO2 96 %.     Lymphatics: No cervical, supraclavicular, axillary, or inguinal nodes.  Prominent bilateral axillary fat pad, 1-2 cm high left axillary node? Resp: Lungs clear bilaterally Cardio: Regular rate and rhythm GI: No hepatosplenomegaly Vascular: Trace pitting edema to lower leg bilaterally   Lab Results:  Lab Results  Component Value Date   WBC 13.3 (H) 12/23/2022   HGB 12.0 (L) 12/23/2022   HCT 36.7 (L) 12/23/2022   MCV 95.8 12/23/2022   PLT 80 (L) 12/23/2022   NEUTROABS 2.5 12/23/2022    CMP  Lab Results  Component Value Date   NA 140 10/22/2021   K 4.5 10/22/2021   CL 108 10/22/2021   CO2 24 10/22/2021   GLUCOSE 100 (H) 10/22/2021   BUN 39 (H) 10/22/2021   CREATININE 1.22 10/22/2021   CALCIUM 9.4 10/22/2021   PROT 6.4 (L) 10/22/2021   ALBUMIN 4.5 10/22/2021   AST 20 10/22/2021   ALT 18 10/22/2021   ALKPHOS 46 10/22/2021   BILITOT 1.4 (H) 10/22/2021   GFRNONAA >60 10/22/2021   GFRAA 67 10/31/2019     Medications: I have reviewed the patient's current medications.   Assessment/Plan: Chronic lymphocytic leukemia presenting with anemia, thrombocytopenia, lymphocytosis Peripheral blood flow cytometry 10/29/2021-monoclonal B-cell population with expression of CD5CD19, CD200, lambda light chain CLL FISH prognostic panel 01/21/2022-deletion 13q14.3, no p53, trisomy 12, or ATM alteration Diastolic CHF followed by Dr. Stanford Breed Hypertension Abdominal aortic aneurysm Hypercholesterolemia Family  history (father) of CLL     Disposition: Mr. Desocio appears stable from a hematologic standpoint.  He has stable mild anemia and thrombocytopenia.  He is asymptomatic from CLL.  He will return for an office and lab visit in 8 months.  He will call in the interim for new symptoms.  He will call for bleeding.  Betsy Coder, MD  12/23/2022  8:40 AM

## 2023-01-12 NOTE — Progress Notes (Signed)
HPI: FU diastolic CHF/edema. Echo 7/15 showed normal LV function, grade 1 diastolic dysfunction; mild LAE. Exercise treadmill April 2016 negative. Abdominal ultrasound November 2023 showed 4.5 cm abdominal aortic aneurysm.  Since I last saw him, the patient has dyspnea with more extreme activities but not with routine activities. It is relieved with rest. It is not associated with chest pain. There is no orthopnea, PND or pedal edema. There is no syncope or palpitations. There is no exertional chest pain.   Current Outpatient Medications  Medication Sig Dispense Refill   aspirin 81 MG tablet Take 1 tablet (81 mg total) by mouth daily.     beta carotene w/minerals (OCUVITE) tablet Take 1 tablet by mouth daily.     clindamycin (CLEOCIN) 150 MG capsule Take 600 mg by mouth as needed (prior to dental procedure).     clobetasol cream (TEMOVATE) 0.05 %      ferrous gluconate (FERGON) 324 MG tablet Take by mouth.     furosemide (LASIX) 40 MG tablet Take 2 tablets (80 mg total) by mouth daily. May add extra 40 mg , for weight gain (Patient taking differently: Take 40 mg by mouth daily. May add extra 40 mg , for weight gain) 90 tablet 6   losartan (COZAAR) 25 MG tablet Take 1 tablet (25 mg total) by mouth 2 (two) times daily.     meloxicam (MOBIC) 15 MG tablet Take 0.5 tablets by mouth daily. Pt taking 7.5 mg     Potassium Chloride ER 20 MEQ TBCR Take 1 tablet by mouth daily.     tamsulosin (FLOMAX) 0.4 MG CAPS capsule Take 0.4 mg by mouth daily.      vitamin C (ASCORBIC ACID) 250 MG tablet 500 mg 2 (two) times daily.     rosuvastatin (CRESTOR) 20 MG tablet Take 1 tablet (20 mg total) by mouth daily. 90 tablet 3   No current facility-administered medications for this visit.     Past Medical History:  Diagnosis Date   Adrenal gland cyst (HCC)    Arthritis    BIL KNEES   Diastolic heart failure, NYHA class 2 (HCC)    Eczema    Herpesviral conjunctivitis    hx of in past causing need for  corneal transplant rt eye   History of kidney stones    HTN (hypertension) 05/27/2014    Past Surgical History:  Procedure Laterality Date   [place] /2001 /2012   L ING HERNIA REPAIR X 6   PILONIDAL CYST EXCISION  1974   TONSILLECTOMY     TOTAL HIP ARTHROPLASTY Right 05/22/2014   Procedure: RIGHT TOTAL HIP ARTHROPLASTY;  Surgeon: Tobi Bastos, MD;  Location: [facility];  Service: Orthopedics;  Laterality: Right;    Social History   Socioeconomic History   Marital status: Married    Spouse name: Not on file   Number of children: 2   Years of education: Not on file   Highest education level: Not on file  Occupational History    Employer: [employer]  Tobacco Use   Smoking status: Former    Types: Cigarettes    Quit date: 05/17/2011    Years since quitting: 11.6   Smokeless tobacco: Never  Vaping Use   Vaping Use: Never used  Substance and Sexual Activity   Alcohol use: Yes    Comment: OCCASIONAL   Drug use: No   Sexual activity: Never  Other Topics  Concern   Not on file  Social History Narrative   Not on file   Social Determinants of Health   Financial Resource Strain: Not on file  Food Insecurity: Not on file  Transportation Needs: Not on file  Physical Activity: Not on file  Stress: Not on file  Social Connections: Not on file  Intimate Partner Violence: Not on file    Family History  Problem Relation Age of Onset   Alzheimer's disease Mother    Cancer - Other Father        cancer of the uvula   Lung cancer Other     ROS: no fevers or chills, productive cough, hemoptysis, dysphasia, odynophagia, melena, hematochezia, dysuria, hematuria, rash, seizure activity, orthopnea, PND, pedal edema, claudication. Remaining systems are negative.  Physical Exam: Well-developed well-nourished in no acute distress.  Skin is warm and dry.  HEENT is normal.  Neck is supple.  Chest is clear to auscultation with  normal expansion.  Cardiovascular exam is regular rate and rhythm.  Abdominal exam nontender or distended. No masses palpated. Extremities show no edema. neuro grossly intact  ECG-normal sinus rhythm at a rate of 79, no ST changes.  Personally reviewed  A/P  1 chronic diastolic congestive heart failure-patient remains euvolemic.  Continue Lasix at present dose.  Potassium and renal function monitored by primary care.  2 abdominal aortic aneurysm-plan follow-up ultrasound May 2024.  3 hyperlipidemia-continue statin.  Lipids and liver monitored by primary care.  4 hypertension-patient's blood pressure is controlled.  Continue present medications and follow.  Kirk Ruths, MD

## 2023-01-19 ENCOUNTER — Ambulatory Visit: Payer: BLUE CROSS/BLUE SHIELD | Attending: Cardiology | Admitting: Cardiology

## 2023-01-19 ENCOUNTER — Encounter: Payer: Self-pay | Admitting: Cardiology

## 2023-01-19 VITALS — BP 106/50 | HR 79 | Ht 71.0 in | Wt 246.0 lb

## 2023-01-19 DIAGNOSIS — I714 Abdominal aortic aneurysm, without rupture, unspecified: Secondary | ICD-10-CM

## 2023-01-19 DIAGNOSIS — I10 Essential (primary) hypertension: Secondary | ICD-10-CM | POA: Diagnosis not present

## 2023-01-19 NOTE — Patient Instructions (Signed)
  Testing/Procedures:  Your physician has requested that you have an abdominal aorta duplex. During this test, an ultrasound is used to evaluate the aorta. Allow 30 minutes for this exam. Do not eat after midnight the day before and avoid carbonated beverages [facility] OFFICE-SCHEDULE IN MAY   Follow-Up: At [facility], you and your health needs are our priority.  As part of our continuing mission to provide you with exceptional heart care, we have created designated Provider Care Teams.  These Care Teams include your primary Cardiologist (physician) and Advanced Practice Providers (APPs -  Physician Assistants and Nurse Practitioners) who all work together to provide you with the care you need, when you need it.  We recommend signing up for the patient portal called "MyChart".  Sign up information is provided on this After Visit Summary.  MyChart is used to connect with patients for Virtual Visits (Telemedicine).  Patients are able to view lab/test results, encounter notes, upcoming appointments, etc.  Non-urgent messages can be sent to your provider as well.   To learn more about what you can do with MyChart, go to [email or web address].    Your next appointment:   12 month(s)  Provider:   Kirk Ruths MD

## 2023-03-24 ENCOUNTER — Other Ambulatory Visit (HOSPITAL_COMMUNITY): Payer: Self-pay | Admitting: Cardiology

## 2023-03-24 ENCOUNTER — Ambulatory Visit (HOSPITAL_COMMUNITY)
Admission: RE | Admit: 2023-03-24 | Discharge: 2023-03-24 | Disposition: A | Discharge status: Home / Self Care | Payer: BLUE CROSS/BLUE SHIELD | Source: Ambulatory Visit | Attending: Cardiology | Admitting: Cardiology

## 2023-03-24 DIAGNOSIS — I714 Abdominal aortic aneurysm, without rupture, unspecified: Secondary | ICD-10-CM | POA: Diagnosis present

## 2023-03-24 DIAGNOSIS — I7143 Infrarenal abdominal aortic aneurysm, without rupture: Secondary | ICD-10-CM | POA: Diagnosis present

## 2023-03-28 ENCOUNTER — Encounter: Payer: Self-pay | Admitting: *Deleted

## 2023-03-28 ENCOUNTER — Other Ambulatory Visit: Payer: Self-pay | Admitting: *Deleted

## 2023-03-28 DIAGNOSIS — I714 Abdominal aortic aneurysm, without rupture, unspecified: Secondary | ICD-10-CM

## 2023-08-21 NOTE — Telephone Encounter (Signed)
error 

## 2023-08-25 ENCOUNTER — Other Ambulatory Visit: Payer: Medicare Other

## 2023-08-25 ENCOUNTER — Ambulatory Visit: Payer: Medicare Other | Admitting: Oncology

## 2023-09-01 ENCOUNTER — Inpatient Hospital Stay: Payer: Medicare Other | Admitting: Oncology

## 2023-09-01 ENCOUNTER — Encounter: Payer: Self-pay | Admitting: Oncology

## 2023-09-01 ENCOUNTER — Inpatient Hospital Stay: Payer: Medicare Other | Attending: Oncology

## 2023-09-01 VITALS — BP 117/61 | HR 59 | Temp 98.1°F | Resp 18 | Wt 234.5 lb

## 2023-09-01 DIAGNOSIS — C9110 Chronic lymphocytic leukemia of B-cell type not having achieved remission: Secondary | ICD-10-CM | POA: Insufficient documentation

## 2023-09-01 DIAGNOSIS — E7800 Pure hypercholesterolemia, unspecified: Secondary | ICD-10-CM | POA: Insufficient documentation

## 2023-09-01 DIAGNOSIS — I714 Abdominal aortic aneurysm, without rupture, unspecified: Secondary | ICD-10-CM | POA: Diagnosis not present

## 2023-09-01 DIAGNOSIS — Z807 Family history of other malignant neoplasms of lymphoid, hematopoietic and related tissues: Secondary | ICD-10-CM | POA: Insufficient documentation

## 2023-09-01 DIAGNOSIS — I5030 Unspecified diastolic (congestive) heart failure: Secondary | ICD-10-CM | POA: Diagnosis not present

## 2023-09-01 DIAGNOSIS — Z79899 Other long term (current) drug therapy: Secondary | ICD-10-CM | POA: Diagnosis not present

## 2023-09-01 DIAGNOSIS — I110 Hypertensive heart disease with heart failure: Secondary | ICD-10-CM | POA: Diagnosis not present

## 2023-09-01 LAB — CBC WITH DIFFERENTIAL (CANCER CENTER ONLY)
Abs Immature Granulocytes: 0.03 10*3/uL (ref 0.00–0.07)
Basophils Absolute: 0.2 10*3/uL — ABNORMAL HIGH (ref 0.0–0.1)
Basophils Relative: 1 %
Eosinophils Absolute: 0.3 10*3/uL (ref 0.0–0.5)
Eosinophils Relative: 2 %
HCT: 36.2 % — ABNORMAL LOW (ref 39.0–52.0)
Hemoglobin: 12 g/dL — ABNORMAL LOW (ref 13.0–17.0)
Immature Granulocytes: 0 %
Lymphocytes Relative: 65 %
Lymphs Abs: 8.1 10*3/uL — ABNORMAL HIGH (ref 0.7–4.0)
MCH: 31.4 pg (ref 26.0–34.0)
MCHC: 33.1 g/dL (ref 30.0–36.0)
MCV: 94.8 fL (ref 80.0–100.0)
Monocytes Absolute: 0.6 10*3/uL (ref 0.1–1.0)
Monocytes Relative: 5 %
Neutro Abs: 3.4 10*3/uL (ref 1.7–7.7)
Neutrophils Relative %: 27 %
Platelet Count: 82 10*3/uL — ABNORMAL LOW (ref 150–400)
RBC: 3.82 MIL/uL — ABNORMAL LOW (ref 4.22–5.81)
RDW: 14.2 % (ref 11.5–15.5)
WBC Count: 12.5 10*3/uL — ABNORMAL HIGH (ref 4.0–10.5)
nRBC: 0 % (ref 0.0–0.2)

## 2023-09-01 NOTE — Progress Notes (Signed)
  [facility] OFFICE PROGRESS NOTE   Diagnosis: CLL  INTERVAL HISTORY:   [name] [name] returns as scheduled.  He feels well.  Good appetite.  No fever or night sweats.  No recent infection.  Objective:  Vital signs in last 24 hours:  There were no vitals taken for this visit.    Lymphatics: No cervical, supraclavicular, axillary, or inguinal nodes Resp: Lungs clear bilaterally Cardio: Regular rate and rhythm GI: No hepatosplenomegaly Vascular: No leg edema   Lab Results:  Lab Results  Component Value Date   WBC 12.5 (H) 09/01/2023   HGB 12.0 (L) 09/01/2023   HCT 36.2 (L) 09/01/2023   MCV 94.8 09/01/2023   PLT 82 (L) 09/01/2023   NEUTROABS 3.4 09/01/2023    CMP  Lab Results  Component Value Date   NA 140 10/22/2021   K 4.5 10/22/2021   CL 108 10/22/2021   CO2 24 10/22/2021   GLUCOSE 100 (H) 10/22/2021   BUN 39 (H) 10/22/2021   CREATININE 1.22 10/22/2021   CALCIUM 9.4 10/22/2021   PROT 6.4 (L) 10/22/2021   ALBUMIN 4.5 10/22/2021   AST 20 10/22/2021   ALT 18 10/22/2021   ALKPHOS 46 10/22/2021   BILITOT 1.4 (H) 10/22/2021   GFRNONAA >60 10/22/2021   GFRAA 67 10/31/2019     Medications: I have reviewed the patient's current medications.   Assessment/Plan: Chronic lymphocytic leukemia presenting with anemia, thrombocytopenia, lymphocytosis Peripheral blood flow cytometry 10/29/2021-monoclonal B-cell population with expression of CD5CD19, CD200, lambda light chain CLL FISH prognostic panel 01/21/2022-deletion 13q14.3, no p53, trisomy 12, or ATM alteration Diastolic CHF followed by Dr. Jens Som Hypertension Abdominal aortic aneurysm Hypercholesterolemia Family history (father) of CLL    Disposition: [name] [name] is able from a hematologic standpoint.  He is asymptomatic from CLL.  There is no indication for treating the CLL.  He will return for an office visit and CBC in 8 months.  He will remain up-to-date on influenza and pneumonia vaccines.  He  will call for bleeding or bruising.  Thornton Papas, MD  09/01/2023  8:34 AM

## 2023-09-20 DIAGNOSIS — H40003 Preglaucoma, unspecified, bilateral: Secondary | ICD-10-CM | POA: Diagnosis not present

## 2023-09-27 NOTE — Telephone Encounter (Signed)
Telephone call  

## 2023-10-17 ENCOUNTER — Ambulatory Visit (HOSPITAL_COMMUNITY)
Admission: RE | Admit: 2023-10-17 | Discharge: 2023-10-17 | Disposition: A | Discharge status: Home / Self Care | Payer: Medicare Other | Source: Ambulatory Visit | Attending: Cardiovascular Disease | Admitting: Cardiovascular Disease

## 2023-10-17 DIAGNOSIS — I7143 Infrarenal abdominal aortic aneurysm, without rupture: Secondary | ICD-10-CM | POA: Diagnosis not present

## 2023-10-17 DIAGNOSIS — I714 Abdominal aortic aneurysm, without rupture, unspecified: Secondary | ICD-10-CM | POA: Diagnosis not present

## 2023-10-24 ENCOUNTER — Other Ambulatory Visit: Payer: Self-pay | Admitting: *Deleted

## 2023-10-24 DIAGNOSIS — I714 Abdominal aortic aneurysm, without rupture, unspecified: Secondary | ICD-10-CM

## 2023-10-26 ENCOUNTER — Encounter: Payer: Self-pay | Admitting: *Deleted

## 2023-12-26 DIAGNOSIS — Z791 Long term (current) use of non-steroidal anti-inflammatories (NSAID): Secondary | ICD-10-CM | POA: Diagnosis not present

## 2023-12-26 DIAGNOSIS — N1831 Chronic kidney disease, stage 3a: Secondary | ICD-10-CM | POA: Diagnosis not present

## 2023-12-26 DIAGNOSIS — M179 Osteoarthritis of knee, unspecified: Secondary | ICD-10-CM | POA: Diagnosis not present

## 2023-12-26 DIAGNOSIS — Z125 Encounter for screening for malignant neoplasm of prostate: Secondary | ICD-10-CM | POA: Diagnosis not present

## 2024-01-24 ENCOUNTER — Other Ambulatory Visit (INDEPENDENT_AMBULATORY_CARE_PROVIDER_SITE_OTHER): Payer: Self-pay

## 2024-01-24 ENCOUNTER — Ambulatory Visit: Payer: Medicare Other | Admitting: Orthopaedic Surgery

## 2024-01-24 ENCOUNTER — Encounter: Payer: Self-pay | Admitting: Orthopaedic Surgery

## 2024-01-24 VITALS — Ht 71.0 in | Wt 247.0 lb

## 2024-01-24 DIAGNOSIS — M25561 Pain in right knee: Secondary | ICD-10-CM

## 2024-01-24 DIAGNOSIS — G8929 Other chronic pain: Secondary | ICD-10-CM

## 2024-01-24 DIAGNOSIS — M1712 Unilateral primary osteoarthritis, left knee: Secondary | ICD-10-CM | POA: Diagnosis not present

## 2024-01-24 DIAGNOSIS — M1711 Unilateral primary osteoarthritis, right knee: Secondary | ICD-10-CM | POA: Diagnosis not present

## 2024-01-24 DIAGNOSIS — M25562 Pain in left knee: Secondary | ICD-10-CM

## 2024-01-24 NOTE — Progress Notes (Signed)
 The patient is a very pleasant 78 year old gentleman that I am seeing for the first time.  He is accompanied by his wife as well.  He has been dealing with bilateral knee pain for many years now.  He has had steroid injections in his knees as well as hyaluronic acid.  At this point none of those injections have helped.  His knee pain is daily and his right hurts worse than his left.  Although he does have significant pain in his left knee as well.  At this point his knee pain is detrimentally affecting his mobility, his quality of life and his actives daily living.  His wife is a retired Engineer, civil (consulting) as well.  He does see cardiologist and has a abdominal aneurysm that is being followed.  He also has a recent PSA this been elevated.  He sees Dr. Mancel Bale with oncology for chronic lymphocytic leukemia.  I was able to review his past medical history and medications within epic.  He is not on blood thinning medications.  Examination both knees shows varus malalignment that is not correctable.  He has patellofemoral crepitation and slight flexion contracture with both knees.  He has significant medial joint line tenderness and global tenderness in both knees.  There is a lot of patellofemoral crepitation with flexing and extending his knees.   X-rays of both knees show significant varus malalignment with bone-on-bone wear of the medial compartment and patellofemoral joint of both knees.  There are osteophytes in all 3 compartments.  We had a long and thorough discussion about knee replacement surgery.  I showed him a knee replacement model and went over his x-rays.  We discussed what to expect from an intraoperative and postoperative standpoint.  He needs clearance from cardiology as well as needing to follow-up with urology due to an elevated PSA.  I have recommended several of my colleagues with [facility] here in town.  He said they would like to wait till surgery after May 9 when a daughter is graduating  with her masters degree.  We will work on getting on the schedule primarily and await findings from urology as well as a cardiology approval for surgery in terms of clearance.  We can go ahead and work on getting him in a spot.  He wishes to proceed with surgery if allowed to.

## 2024-01-24 NOTE — Progress Notes (Signed)
 HPI: FU diastolic CHF/edema. Echo 7/15 showed normal LV function, grade 1 diastolic dysfunction; mild LAE. Exercise treadmill April 2016 negative. Abdominal ultrasound December 2024 showed abdominal aortic aneurysm measuring 4.7 cm and follow-up recommended in 12 months.  Since I last saw him, he denies dyspnea, chest pain, palpitations or syncope.  He can ambulate at least 1 mile without having dyspnea or chest pain.  Current Outpatient Medications  Medication Sig Dispense Refill   aspirin 81 MG tablet Take 1 tablet (81 mg total) by mouth daily.     beta carotene w/minerals (OCUVITE) tablet Take 1 tablet by mouth daily.     ferrous gluconate (FERGON) 324 MG tablet Take by mouth.     furosemide (LASIX) 40 MG tablet Take 2 tablets (80 mg total) by mouth daily. May add extra 40 mg , for weight gain (Patient taking differently: Take 40 mg by mouth daily. May add extra 40 mg , for weight gain) 90 tablet 6   losartan (COZAAR) 25 MG tablet Take 1 tablet (25 mg total) by mouth 2 (two) times daily.     meloxicam (MOBIC) 15 MG tablet Take 0.5 tablets by mouth daily. Pt taking 7.5 mg     Potassium Chloride ER 20 MEQ TBCR Take 1 tablet by mouth daily.     rosuvastatin (CRESTOR) 20 MG tablet Take 1 tablet (20 mg total) by mouth daily. 90 tablet 3   tamsulosin (FLOMAX) 0.4 MG CAPS capsule Take 0.4 mg by mouth daily.      vitamin C (ASCORBIC ACID) 250 MG tablet 500 mg 2 (two) times daily.     No current facility-administered medications for this visit.     Past Medical History:  Diagnosis Date   Adrenal gland cyst (HCC)    Arthritis    BIL KNEES   Diastolic heart failure, NYHA class 2 (HCC)    Eczema    Herpesviral conjunctivitis    hx of in past causing need for corneal transplant rt eye   History of kidney stones    HTN (hypertension) 05/27/2014    Past Surgical History:  Procedure Laterality Date   CORNEAL TRANSPLANT  1986   HERNIA REPAIR  1981 /2001 /2012   L ING HERNIA REPAIR X 6    PILONIDAL CYST EXCISION  1974   TONSILLECTOMY     TOTAL HIP ARTHROPLASTY Right 05/22/2014   Procedure: RIGHT TOTAL HIP ARTHROPLASTY;  Surgeon: Jacki Cones, MD;  Location: [facility];  Service: Orthopedics;  Laterality: Right;    Social History   Socioeconomic History   Marital status: Married    Spouse name: Not on file   Number of children: 2   Years of education: Not on file   Highest education level: Not on file  Occupational History    Employer: [employer]  Tobacco Use   Smoking status: Former    Current packs/day: 0.00    Types: Cigarettes    Quit date: 05/17/2011    Years since quitting: 12.7   Smokeless tobacco: Never  Vaping Use   Vaping status: Never Used  Substance and Sexual Activity   Alcohol use: Yes    Comment: OCCASIONAL   Drug use: No   Sexual activity: Never  Other Topics Concern   Not on file  Social History Narrative   Not on file   Social Drivers of Health   Financial Resource Strain: Not on file  Food Insecurity: Not on file  Transportation Needs: Not on file  Physical Activity: Not on file  Stress: Not on file  Social Connections: Not on file  Intimate Partner Violence: Not on file    Family History  Problem Relation Age of Onset   Alzheimer's disease Mother    Cancer - Other Father        cancer of the uvula   Lung cancer Other     ROS: Knee arthralgias but no fevers or chills, productive cough, hemoptysis, dysphasia, odynophagia, melena, hematochezia, dysuria, hematuria, rash, seizure activity, orthopnea, PND,  claudication. Remaining systems are negative.  Physical Exam: Well-developed well-nourished in no acute distress.  Skin is warm and dry.  HEENT is normal.  Neck is supple.  Chest is clear to auscultation with normal expansion.  Cardiovascular exam is regular rate and rhythm.  Abdominal exam nontender or distended. No masses palpated. Extremities show trace edema. neuro grossly intact  EKG  Interpretation Date/Time:  Tuesday February 06 2024 07:58:53 EDT Ventricular Rate:  56 PR Interval:  180 QRS Duration:  118 QT Interval:  390 QTC Calculation: 376 R Axis:   -23  Text Interpretation: Sinus bradycardia with sinus arrhythmia Right ventricular conduction delay Confirmed by Olga Millers ([id number]) on 02/06/2024 8:00:37 AM    A/P  1 chronic diastolic congestive heart failure-patient appears to be euvolemic on examination.  Continue diuretic at present.  Check potassium and renal function in 1 week.  Repeat echocardiogram.  2 abdominal aortic aneurysm-patient will need follow-up ultrasound June 2025.  3 hyperlipidemia-continue statin.  Check lipids and liver.  4 hypertension-blood pressure controlled.  Continue present medical regimen.  5 preoperative evaluation-prior to knee replacement.  He has excellent functional capacity and is limited only by knee pain.  He can walk at least 1 mile without having chest pain or dyspnea.  He may proceed with knee replacement without further cardiac evaluation.  Olga Millers, MD

## 2024-02-06 ENCOUNTER — Encounter: Payer: Self-pay | Admitting: Cardiology

## 2024-02-06 ENCOUNTER — Ambulatory Visit: Payer: Medicare Other | Attending: Cardiology | Admitting: Cardiology

## 2024-02-06 VITALS — BP 106/64 | HR 56 | Ht 71.0 in | Wt 237.0 lb

## 2024-02-06 DIAGNOSIS — I10 Essential (primary) hypertension: Secondary | ICD-10-CM | POA: Diagnosis not present

## 2024-02-06 DIAGNOSIS — I5032 Chronic diastolic (congestive) heart failure: Secondary | ICD-10-CM | POA: Diagnosis not present

## 2024-02-06 DIAGNOSIS — I714 Abdominal aortic aneurysm, without rupture, unspecified: Secondary | ICD-10-CM

## 2024-02-06 DIAGNOSIS — E7800 Pure hypercholesterolemia, unspecified: Secondary | ICD-10-CM | POA: Diagnosis not present

## 2024-02-06 LAB — COMPREHENSIVE METABOLIC PANEL WITH GFR
ALT: 16 IU/L (ref 0–44)
AST: 21 IU/L (ref 0–40)
Albumin: 4.3 g/dL (ref 3.8–4.8)
Alkaline Phosphatase: 72 IU/L (ref 44–121)
BUN/Creatinine Ratio: 27 — ABNORMAL HIGH (ref 10–24)
BUN: 31 mg/dL — ABNORMAL HIGH (ref 8–27)
Bilirubin Total: 1.2 mg/dL (ref 0.0–1.2)
CO2: 22 mmol/L (ref 20–29)
Calcium: 9.4 mg/dL (ref 8.6–10.2)
Chloride: 108 mmol/L — ABNORMAL HIGH (ref 96–106)
Creatinine, Ser: 1.14 mg/dL (ref 0.76–1.27)
Globulin, Total: 1.7 g/dL (ref 1.5–4.5)
Glucose: 97 mg/dL (ref 70–99)
Potassium: 4.7 mmol/L (ref 3.5–5.2)
Sodium: 141 mmol/L (ref 134–144)
Total Protein: 6 g/dL (ref 6.0–8.5)
eGFR: 66 mL/min/{1.73_m2} (ref >59)

## 2024-02-06 LAB — LIPID PANEL
Chol/HDL Ratio: 2.1 ratio (ref 0.0–5.0)
Cholesterol, Total: 113 mg/dL (ref 100–199)
HDL: 54 mg/dL (ref >39)
LDL Chol Calc (NIH): 49 mg/dL (ref 0–99)
Triglycerides: 36 mg/dL (ref 0–149)
VLDL Cholesterol Cal: 10 mg/dL (ref 5–40)

## 2024-02-06 NOTE — Patient Instructions (Signed)
 Medication Instructions:   NO CHANGES  *If you need a refill on your cardiac medications before your next appointment, please call your pharmacy*  Lab Work:  Your physician recommends that you HAVE LAB WORK TODAY  If you have labs (blood work) drawn today and your tests are completely normal, you will receive your results only by: MyChart Message (if you have MyChart) OR A paper copy in the mail If you have any lab test that is abnormal or we need to change your treatment, we will call you to review the results.  Testing/Procedures:  Your physician has requested that you have an echocardiogram. Echocardiography is a painless test that uses sound waves to create images of your heart. It provides your doctor with information about the size and shape of your heart and how well your heart's chambers and valves are working. This procedure takes approximately one hour. There are no restrictions for this procedure. Please do NOT wear cologne, perfume, aftershave, or lotions (deodorant is allowed). Please arrive 15 minutes prior to your appointment time.  Please note: We ask at that you not bring children with you during ultrasound (echo/ vascular) testing. Due to room size and safety concerns, children are not allowed in the ultrasound rooms during exams. Our front office staff cannot provide observation of children in our lobby area while testing is being conducted. An adult accompanying a patient to their appointment will only be allowed in the ultrasound room at the discretion of the ultrasound technician under special circumstances. We apologize for any inconvenience. [address] [facility]   Your physician has requested that you have an abdominal aorta duplex. During this test, an ultrasound is used to evaluate the aorta. Allow 30 minutes for this exam. Do not eat after midnight the day before and avoid carbonated beverages.  Please note: We ask at that you not bring children with you  during ultrasound (echo/ vascular) testing. Due to room size and safety concerns, children are not allowed in the ultrasound rooms during exams. Our front office staff cannot provide observation of children in our lobby area while testing is being conducted. An adult accompanying a patient to their appointment will only be allowed in the ultrasound room at the discretion of the ultrasound technician under special circumstances. We apologize for any inconvenience. [facility] OFF[facility]  Follow-Up: At [facility], you and your health needs are our priority.  As part of our continuing mission to provide you with exceptional heart care, our providers are all part of one team.  This team includes your primary Cardiologist (physician) and Advanced Practice Providers or APPs (Physician Assistants and Nurse Practitioners) who all work together to provide you with the care you need, when you need it.  Your next appointment:   12 month(s)  Provider:   Olga Millers MD   We recommend signing up for the patient portal called "MyChart".  Sign up information is provided on this After Visit Summary.  MyChart is used to connect with patients for Virtual Visits (Telemedicine).  Patients are able to view lab/test results, encounter notes, upcoming appointments, etc.  Non-urgent messages can be sent to your provider as well.   To learn more about what you can do with MyChart, go to [email or web address].         1st Floor: - Lobby - Registration  - Pharmacy  - Lab - Cafe  2nd Floor: - PV Lab - Diagnostic Testing (echo, CT, nuclear med)  3rd Floor: - Vacant  4th  Floor: - TCTS (cardiothoracic surgery) - AFib Clinic - Structural Heart Clinic - Vascular Surgery  - Vascular Ultrasound  5th Floor: - [facility]Care Cardiology (general and EP) - Clinical Pharmacy for coumadin, hypertension, lipid, weight-loss medications, and med management appointments    Valet parking services will be  available as well.

## 2024-02-09 ENCOUNTER — Encounter: Payer: Self-pay | Admitting: *Deleted

## 2024-02-09 ENCOUNTER — Telehealth: Payer: Self-pay

## 2024-02-09 NOTE — Telephone Encounter (Signed)
 I called and spoke with wife regarding scheduling surgery.  She stated he has an elevated PSA with urology appt on 03/18/24.  Once it is determined treatment, if any for that, he will call back to get scheduled for TKA.

## 2024-02-15 ENCOUNTER — Telehealth: Payer: Self-pay | Admitting: Cardiology

## 2024-02-15 DIAGNOSIS — I10 Essential (primary) hypertension: Secondary | ICD-10-CM

## 2024-02-15 DIAGNOSIS — I5031 Acute diastolic (congestive) heart failure: Secondary | ICD-10-CM

## 2024-02-15 MED ORDER — FUROSEMIDE 40 MG PO TABS: 40.0000 mg | ORAL_TABLET | Freq: Every day | ORAL | Status: AC

## 2024-02-15 NOTE — Telephone Encounter (Signed)
 Pt c/o medication issue:  1. Name of Medication:   furosemide (LASIX) 40 MG tablet    2. How are you currently taking this medication (dosage and times per day)?  Take 2 tablets (80 mg total) by mouth daily. May add extra 40 mg , for weight gainPatient taking differently: Take 40 mg by mouth daily. May add extra 40 mg , for weight gain     3. Are you having a reaction (difficulty breathing--STAT)? No  4. What is your medication issue? Pt is requesting a callback from a nurse regarding him needing clarity on the dosage and how he should be taking this medication. Please advise

## 2024-02-15 NOTE — Telephone Encounter (Signed)
Left message for patient with Dr Creshaw's recommendations.   

## 2024-03-18 DIAGNOSIS — R3914 Feeling of incomplete bladder emptying: Secondary | ICD-10-CM | POA: Diagnosis not present

## 2024-03-18 DIAGNOSIS — Z125 Encounter for screening for malignant neoplasm of prostate: Secondary | ICD-10-CM | POA: Diagnosis not present

## 2024-03-21 ENCOUNTER — Ambulatory Visit (HOSPITAL_BASED_OUTPATIENT_CLINIC_OR_DEPARTMENT_OTHER)
Admission: RE | Admit: 2024-03-21 | Discharge: 2024-03-21 | Disposition: A | Discharge status: Home / Self Care | Source: Ambulatory Visit | Attending: Cardiology | Admitting: Cardiology

## 2024-03-21 ENCOUNTER — Ambulatory Visit (HOSPITAL_COMMUNITY)
Admission: RE | Admit: 2024-03-21 | Discharge: 2024-03-21 | Disposition: A | Discharge status: Home / Self Care | Source: Ambulatory Visit | Attending: Cardiology | Admitting: Cardiology

## 2024-03-21 DIAGNOSIS — I5032 Chronic diastolic (congestive) heart failure: Secondary | ICD-10-CM

## 2024-03-21 DIAGNOSIS — I714 Abdominal aortic aneurysm, without rupture, unspecified: Secondary | ICD-10-CM | POA: Insufficient documentation

## 2024-03-22 ENCOUNTER — Ambulatory Visit: Payer: Self-pay | Admitting: Cardiology

## 2024-03-22 LAB — ECHOCARDIOGRAM COMPLETE
AR max vel: 3.15 cm2
AV Area VTI: 3.2 cm2
AV Area mean vel: 3.17 cm2
AV Mean grad: 4 mmHg
AV Peak grad: 6.7 mmHg
Ao pk vel: 1.29 m/s
Area-P 1/2: 3.99 cm2
MV M vel: 2.92 m/s
MV Peak grad: 34.1 mmHg
S' Lateral: 3.24 cm

## 2024-03-25 ENCOUNTER — Encounter: Payer: Self-pay | Admitting: *Deleted

## 2024-03-25 ENCOUNTER — Other Ambulatory Visit: Payer: Self-pay | Admitting: *Deleted

## 2024-03-25 DIAGNOSIS — I714 Abdominal aortic aneurysm, without rupture, unspecified: Secondary | ICD-10-CM

## 2024-03-28 DIAGNOSIS — R8271 Bacteriuria: Secondary | ICD-10-CM | POA: Diagnosis not present

## 2024-03-28 DIAGNOSIS — Z87442 Personal history of urinary calculi: Secondary | ICD-10-CM | POA: Diagnosis not present

## 2024-03-28 DIAGNOSIS — R310 Gross hematuria: Secondary | ICD-10-CM | POA: Diagnosis not present

## 2024-04-03 ENCOUNTER — Other Ambulatory Visit (HOSPITAL_COMMUNITY): Payer: Self-pay

## 2024-04-03 DIAGNOSIS — R310 Gross hematuria: Secondary | ICD-10-CM

## 2024-04-11 ENCOUNTER — Ambulatory Visit (HOSPITAL_BASED_OUTPATIENT_CLINIC_OR_DEPARTMENT_OTHER)

## 2024-04-14 ENCOUNTER — Ambulatory Visit (HOSPITAL_BASED_OUTPATIENT_CLINIC_OR_DEPARTMENT_OTHER)
Admission: RE | Admit: 2024-04-14 | Discharge: 2024-04-14 | Disposition: A | Discharge status: Home / Self Care | Source: Ambulatory Visit

## 2024-04-14 DIAGNOSIS — R310 Gross hematuria: Secondary | ICD-10-CM | POA: Diagnosis not present

## 2024-04-14 DIAGNOSIS — K4090 Unilateral inguinal hernia, without obstruction or gangrene, not specified as recurrent: Secondary | ICD-10-CM | POA: Diagnosis not present

## 2024-04-14 DIAGNOSIS — N400 Enlarged prostate without lower urinary tract symptoms: Secondary | ICD-10-CM | POA: Diagnosis not present

## 2024-04-14 DIAGNOSIS — N201 Calculus of ureter: Secondary | ICD-10-CM | POA: Diagnosis not present

## 2024-04-14 DIAGNOSIS — K5730 Diverticulosis of large intestine without perforation or abscess without bleeding: Secondary | ICD-10-CM | POA: Diagnosis not present

## 2024-04-14 LAB — POCT I-STAT CREATININE: Creatinine, Ser: 1.3 mg/dL — ABNORMAL HIGH (ref 0.61–1.24)

## 2024-04-14 MED ORDER — IOHEXOL 300 MG/ML  SOLN
100.0000 mL | Freq: Once | INTRAMUSCULAR | Status: AC | PRN
  Administered 2024-04-14: 100 mL via INTRAVENOUS

## 2024-04-15 DIAGNOSIS — L308 Other specified dermatitis: Secondary | ICD-10-CM | POA: Diagnosis not present

## 2024-04-15 DIAGNOSIS — L818 Other specified disorders of pigmentation: Secondary | ICD-10-CM | POA: Diagnosis not present

## 2024-05-01 ENCOUNTER — Other Ambulatory Visit: Payer: Medicare Other

## 2024-05-01 ENCOUNTER — Encounter: Payer: Self-pay | Admitting: Oncology

## 2024-05-01 ENCOUNTER — Inpatient Hospital Stay: Payer: Medicare Other | Attending: Oncology | Admitting: Oncology

## 2024-05-01 ENCOUNTER — Inpatient Hospital Stay: Payer: Self-pay

## 2024-05-01 VITALS — BP 111/66 | HR 83 | Temp 98.0°F | Resp 18 | Ht 71.0 in | Wt 231.7 lb

## 2024-05-01 DIAGNOSIS — C9110 Chronic lymphocytic leukemia of B-cell type not having achieved remission: Secondary | ICD-10-CM | POA: Diagnosis not present

## 2024-05-01 LAB — CBC WITH DIFFERENTIAL (CANCER CENTER ONLY)
Abs Immature Granulocytes: 0.04 10*3/uL (ref 0.00–0.07)
Basophils Absolute: 0 10*3/uL (ref 0.0–0.1)
Basophils Relative: 0 %
Eosinophils Absolute: 0.1 10*3/uL (ref 0.0–0.5)
Eosinophils Relative: 1 %
HCT: 35.5 % — ABNORMAL LOW (ref 39.0–52.0)
Hemoglobin: 11.7 g/dL — ABNORMAL LOW (ref 13.0–17.0)
Immature Granulocytes: 0 %
Lymphocytes Relative: 67 %
Lymphs Abs: 9.6 10*3/uL — ABNORMAL HIGH (ref 0.7–4.0)
MCH: 31.5 pg (ref 26.0–34.0)
MCHC: 33 g/dL (ref 30.0–36.0)
MCV: 95.7 fL (ref 80.0–100.0)
Monocytes Absolute: 0.7 10*3/uL (ref 0.1–1.0)
Monocytes Relative: 5 %
Neutro Abs: 3.9 10*3/uL (ref 1.7–7.7)
Neutrophils Relative %: 27 %
Platelet Count: 79 10*3/uL — ABNORMAL LOW (ref 150–400)
RBC: 3.71 MIL/uL — ABNORMAL LOW (ref 4.22–5.81)
RDW: 14.2 % (ref 11.5–15.5)
WBC Count: 14.4 10*3/uL — ABNORMAL HIGH (ref 4.0–10.5)
nRBC: 0 % (ref 0.0–0.2)

## 2024-05-01 NOTE — Progress Notes (Signed)
  [name] OFFICE PROGRESS NOTE   Diagnosis: CLL  INTERVAL HISTORY:   [name] [name] returns as scheduled.  He feels well.  Good appetite.  No fever or night sweats.  No palpable lymph nodes.  Objective:  Vital signs in last 24 hours:  Blood pressure 111/66, pulse 83, temperature 98 F (36.7 C), temperature source Temporal, resp. rate 18, height 5' 11 (1.803 m), weight 231 lb 11.2 oz (105.1 kg), SpO2 98%.     Lymphatics: No cervical, supraclavicular, axillary, or inguinal nodes.  Soft tubular fullness in the left inguinal region without a discrete lymph node Resp: Lungs clear bilaterally Cardio: Regular rate and rhythm GI: No hepatosplenomegaly, no mass, nontender Vascular: Trace pitting edema to lower leg bilaterally, chronic stasis change at the lower leg bilaterally  Lab Results:  Lab Results  Component Value Date   WBC 12.5 (H) 09/01/2023   HGB 12.0 (L) 09/01/2023   HCT 36.2 (L) 09/01/2023   MCV 94.8 09/01/2023   PLT 82 (L) 09/01/2023   NEUTROABS 3.4 09/01/2023    CMP  Lab Results  Component Value Date   NA 141 02/06/2024   K 4.7 02/06/2024   CL 108 (H) 02/06/2024   CO2 22 02/06/2024   GLUCOSE 97 02/06/2024   BUN 31 (H) 02/06/2024   CREATININE 1.30 (H) 04/14/2024   CALCIUM  9.4 02/06/2024   PROT 6.0 02/06/2024   ALBUMIN  4.3 02/06/2024   AST 21 02/06/2024   ALT 16 02/06/2024   ALKPHOS 72 02/06/2024   BILITOT 1.2 02/06/2024   GFRNONAA >60 10/22/2021   GFRAA 67 10/31/2019    Medications: I have reviewed the patient's current medications.   Assessment/Plan:  Chronic lymphocytic leukemia presenting with anemia, thrombocytopenia, lymphocytosis Peripheral blood flow cytometry 10/29/2021-monoclonal B-cell population with expression of CD5CD19, CD200, lambda light chain CLL FISH prognostic panel 01/21/2022-deletion 13q14.3, no p53, trisomy 12, or ATM alteration Diastolic CHF followed by Dr. Pietro Hypertension Abdominal aortic  aneurysm Hypercholesterolemia Family history (father) of CLL    Disposition: [name] [name] has CLL.  He is stable from a hematologic standpoint and asymptomatic.  There is no indication for treating the CLL at present.  He has persistent moderate thrombocytopenia.  He will call for spontaneous bleeding or bruising.  He will return for an office and lab visit in 8 months. [name] [name] will remain up-to-date on influenza and pneumonia vaccines.  He will consult with his pharmacy regarding the indication for the pneumococcal 20 or 21 vaccine.  Arley Hof, MD  05/01/2024  8:37 AM

## 2024-05-14 DIAGNOSIS — N201 Calculus of ureter: Secondary | ICD-10-CM | POA: Diagnosis not present

## 2024-05-14 DIAGNOSIS — I714 Abdominal aortic aneurysm, without rupture, unspecified: Secondary | ICD-10-CM | POA: Diagnosis not present

## 2024-05-14 DIAGNOSIS — Z125 Encounter for screening for malignant neoplasm of prostate: Secondary | ICD-10-CM | POA: Diagnosis not present

## 2024-05-14 DIAGNOSIS — R310 Gross hematuria: Secondary | ICD-10-CM | POA: Diagnosis not present

## 2024-06-25 DIAGNOSIS — R7303 Prediabetes: Secondary | ICD-10-CM | POA: Diagnosis not present

## 2024-06-25 DIAGNOSIS — Z1331 Encounter for screening for depression: Secondary | ICD-10-CM | POA: Diagnosis not present

## 2024-06-25 DIAGNOSIS — Z0000 Encounter for general adult medical examination without abnormal findings: Secondary | ICD-10-CM | POA: Diagnosis not present

## 2024-06-25 DIAGNOSIS — E785 Hyperlipidemia, unspecified: Secondary | ICD-10-CM | POA: Diagnosis not present

## 2024-06-25 DIAGNOSIS — I10 Essential (primary) hypertension: Secondary | ICD-10-CM | POA: Diagnosis not present

## 2024-07-30 DIAGNOSIS — I10 Essential (primary) hypertension: Secondary | ICD-10-CM | POA: Diagnosis not present

## 2024-07-30 DIAGNOSIS — N1831 Chronic kidney disease, stage 3a: Secondary | ICD-10-CM | POA: Diagnosis not present

## 2024-08-15 DIAGNOSIS — N201 Calculus of ureter: Secondary | ICD-10-CM | POA: Diagnosis not present

## 2024-08-15 DIAGNOSIS — R3914 Feeling of incomplete bladder emptying: Secondary | ICD-10-CM | POA: Diagnosis not present

## 2024-09-09 ENCOUNTER — Encounter: Payer: Self-pay | Admitting: Radiology

## 2024-09-26 ENCOUNTER — Telehealth: Payer: Self-pay | Admitting: Cardiology

## 2024-09-26 ENCOUNTER — Ambulatory Visit: Payer: Self-pay | Admitting: Cardiology

## 2024-09-26 ENCOUNTER — Other Ambulatory Visit: Payer: Self-pay | Admitting: Urology

## 2024-09-26 ENCOUNTER — Telehealth: Payer: Self-pay

## 2024-09-26 ENCOUNTER — Ambulatory Visit (HOSPITAL_COMMUNITY)
Admission: RE | Admit: 2024-09-26 | Discharge: 2024-09-26 | Disposition: A | Discharge status: Home / Self Care | Source: Ambulatory Visit | Attending: Cardiology | Admitting: Cardiology

## 2024-09-26 DIAGNOSIS — I714 Abdominal aortic aneurysm, without rupture, unspecified: Secondary | ICD-10-CM | POA: Diagnosis not present

## 2024-09-26 DIAGNOSIS — N201 Calculus of ureter: Secondary | ICD-10-CM | POA: Diagnosis not present

## 2024-09-26 NOTE — Telephone Encounter (Signed)
 Appointment scheduled for 09/30/24 @ 3:40pm. Med and consent are complete. Call patient at [phone]

## 2024-09-26 NOTE — Telephone Encounter (Signed)
   [facility] Pre-operative Risk Assessment    Request for surgical clearance:  What type of surgery is being performed?  Right Ureteroscopy    When is this surgery scheduled?  10/09/24  What type of clearance is required (medical clearance vs. Pharmacy clearance to hold med vs. Both)?  Medical  Are there any medications that need to be held prior to surgery and how long? No    Practice name and name of physician performing surgery?  [facility]  Dr. Ricardo Likens   What is your office phone number? [phone] (ext#: [phone])   7.   What is your office fax number? [phone]  8.   Anesthesia type (None, local, MAC, general)?  General    [name] [name] 09/26/2024, 4:20 PM

## 2024-09-26 NOTE — Telephone Encounter (Signed)
   Name: [name] [name]  DOB: [birth year]  MRN: [id number]  Primary Cardiologist: Redell Shallow, MD  Chart reviewed as part of pre-operative protocol coverage. Because of [name] [name]'s past medical history and time since last visit, he will require a follow-up telephone visit in order to better assess preoperative cardiovascular risk.  Pre-op covering staff: - Please schedule appointment and call patient to inform them. If patient already had an upcoming appointment within acceptable timeframe, please add pre-op clearance to the appointment notes so provider is aware. - Please contact requesting surgeon's office via preferred method (i.e, phone, fax) to inform them of need for appointment prior to surgery.  No medications indicated as needing held.  Orren LOISE Fabry, PA-C  09/26/2024, 5:00 PM

## 2024-09-26 NOTE — Telephone Encounter (Signed)
  Patient Consent for Virtual Visit         [name] has provided verbal consent on 09/26/2024 for a virtual visit (video or telephone).  Appointment scheduled for 09/30/24 @ 3:40pm. Med and consent are complete. Call patient at [phone]    CONSENT FOR VIRTUAL VISIT FOR:  [name] [name]  By participating in this virtual visit I agree to the following:  I hereby voluntarily request, consent and authorize [facility] and its employed or contracted physicians, physician assistants, nurse practitioners or other licensed health care professionals (the Practitioner), to provide me with telemedicine health care services (the "Services) as deemed necessary by the treating Practitioner. I acknowledge and consent to receive the Services by the Practitioner via telemedicine. I understand that the telemedicine visit will involve communicating with the Practitioner through live audiovisual communication technology and the disclosure of certain medical information by electronic transmission. I acknowledge that I have been given the opportunity to request an in-person assessment or other available alternative prior to the telemedicine visit and am voluntarily participating in the telemedicine visit.  I understand that I have the right to withhold or withdraw my consent to the use of telemedicine in the course of my care at any time, without affecting my right to future care or treatment, and that the Practitioner or I may terminate the telemedicine visit at any time. I understand that I have the right to inspect all information obtained and/or recorded in the course of the telemedicine visit and may receive copies of available information for a reasonable fee.  I understand that some of the potential risks of receiving the Services via telemedicine include:  Delay or interruption in medical evaluation due to technological equipment failure or disruption; Information transmitted may not be  sufficient (e.g. poor resolution of images) to allow for appropriate medical decision making by the Practitioner; and/or  In rare instances, security protocols could fail, causing a breach of personal health information.  Furthermore, I acknowledge that it is my responsibility to provide information about my medical history, conditions and care that is complete and accurate to the best of my ability. I acknowledge that Practitioner's advice, recommendations, and/or decision may be based on factors not within their control, such as incomplete or inaccurate data provided by me or distortions of diagnostic images or specimens that may result from electronic transmissions. I understand that the practice of medicine is not an exact science and that Practitioner makes no warranties or guarantees regarding treatment outcomes. I acknowledge that a copy of this consent can be made available to me via my patient portal [facility] MyChart), or I can request a printed copy by calling the office of [facility].    I understand that my insurance will be billed for this visit.   I have read or had this consent read to me. I understand the contents of this consent, which adequately explains the benefits and risks of the Services being provided via telemedicine.  I have been provided ample opportunity to ask questions regarding this consent and the Services and have had my questions answered to my satisfaction. I give my informed consent for the services to be provided through the use of telemedicine in my medical care

## 2024-09-27 ENCOUNTER — Other Ambulatory Visit: Payer: Self-pay

## 2024-09-27 DIAGNOSIS — I714 Abdominal aortic aneurysm, without rupture, unspecified: Secondary | ICD-10-CM

## 2024-09-30 ENCOUNTER — Ambulatory Visit: Attending: Cardiology | Admitting: Cardiology

## 2024-09-30 DIAGNOSIS — Z01810 Encounter for preprocedural cardiovascular examination: Secondary | ICD-10-CM

## 2024-09-30 DIAGNOSIS — Z01818 Encounter for other preprocedural examination: Secondary | ICD-10-CM

## 2024-09-30 NOTE — Progress Notes (Signed)
 Virtual Visit via Telephone Note   Because of Kaynan Finkler co-morbid illnesses, he is at least at moderate risk for complications without adequate follow up.  This format is felt to be most appropriate for this patient at this time.  Due to technical limitations with video connection (technology), today's appointment will be conducted as an audio only telehealth visit, and [name] [name] verbally agreed to proceed in this manner.   All issues noted in this document were discussed and addressed.  No physical exam could be performed with this format.  Evaluation Performed:  Preoperative cardiovascular risk assessment _____________   Date:  09/30/2024   Patient ID:  [name] [name], DOB 11-26-1945, MRN [id number] Patient Location:  Home Provider location:   Office  Primary Care Provider:  Douglass Ivanoff, MD Primary Cardiologist:  Redell Shallow, MD  Chief Complaint / Patient Profile   78 y.o. y/o male with a h/o HFpEF, abdominal aortic aneurysm, dyslipidemia, hypertension who is pending right ureteroscopy and presents today for telephonic preoperative cardiovascular risk assessment.  History of Present Illness    [name] [name] is a 78 y.o. male who presents via audio conferencing for a telehealth visit today.  Pt was last seen in cardiology clinic on 02/06/2024 by Dr. Shallow.  At that time [name] [name] was doing well, walking daily for exercise and was also pending knee replacement and was cleared to proceed with surgery without further testing.  The patient is now pending procedure as outlined above. Since his last visit, he has been doing well, no formal complaints from a cardiac perspective.  Stays busy around his house. He denies chest pain, palpitations, dyspnea, pnd, orthopnea, n, v, dizziness, syncope, edema, weight gain, or early satiety.     Past Medical History    Past Medical History:  Diagnosis Date   Adrenal gland cyst    Arthritis    BIL KNEES   Diastolic heart failure, NYHA  class 2 (HCC)    Eczema    Herpesviral conjunctivitis    hx of in past causing need for corneal transplant rt eye   History of kidney stones    HTN (hypertension) 05/27/2014   Past Surgical History:  Procedure Laterality Date   CORNEAL TRANSPLANT  1986   HERNIA REPAIR  1981 /2001 /2012   L ING HERNIA REPAIR X 6   PILONIDAL CYST EXCISION  1974   TONSILLECTOMY     TOTAL HIP ARTHROPLASTY Right 05/22/2014   Procedure: RIGHT TOTAL HIP ARTHROPLASTY;  Surgeon: Tanda DELENA Heading, MD;  Location: [facility];  Service: Orthopedics;  Laterality: Right;    Allergies  Allergies  Allergen Reactions   Prednisone     Leg cramps   Penicillins Rash    Home Medications    Prior to Admission medications   Medication Sig Start Date End Date Taking? Authorizing Provider  acidophilus (RISAQUAD) CAPS capsule Take 1 capsule by mouth daily.    [provider]  aspirin  81 MG tablet Take 1 tablet (81 mg total) by mouth daily. 08/14/14   Shallow Redell RAMAN, MD  beta carotene w/minerals (OCUVITE) tablet Take 1 tablet by mouth daily.    [provider]  ELDERBERRY PO Take 50 mg by mouth daily.    [provider]  ferrous gluconate (FERGON) 324 MG tablet Take 324 mg by mouth daily.    [provider]  furosemide  (LASIX ) 40 MG tablet Take 1 tablet (40 mg total) by mouth daily. May add extra 40 mg , for weight  gain Patient taking differently: Take 40 mg by mouth daily. May add an extra 40 mg , for weight gain > 1lb 02/15/24   Pietro Redell RAMAN, MD  losartan  (COZAAR ) 25 MG tablet Take 1 tablet (25 mg total) by mouth 2 (two) times daily. 05/13/22   Pietro Redell RAMAN, MD  Magnesium Cl-Calcium  Carbonate (SLOW-MAG PO) Take 1 tablet by mouth daily.    [provider]  meloxicam (MOBIC) 7.5 MG tablet Take 7.5 mg by mouth daily. 12/07/14   [provider]  Potassium Chloride  ER 20 MEQ TBCR Take 20 mEq by mouth daily. 08/02/21   [provider]  rosuvastatin  (CRESTOR )  20 MG tablet Take 1 tablet (20 mg total) by mouth daily. Patient taking differently: Take 20 mg by mouth at bedtime. 11/04/19 09/30/24  Pietro Redell RAMAN, MD  tamsulosin  (FLOMAX ) 0.4 MG CAPS capsule Take 0.4 mg by mouth at bedtime. 06/04/14   Rama, Tawni SQUIBB, MD  vitamin C (ASCORBIC ACID) 250 MG tablet Take 500 mg by mouth 2 (two) times daily.    [provider]    Physical Exam    Vital Signs:  [name] [name] does not have vital signs available for review today.  Given telephonic nature of communication, physical exam is limited. AAOx3. NAD. Normal affect.  Speech and respirations are unlabored.  Accessory Clinical Findings    None  Assessment & Plan    1.  Preoperative Cardiovascular Risk Assessment:     [name] [name]'s perioperative risk of a major cardiac event is 0.9% according to the Revised Cardiac Risk Index (RCRI).  Therefore, he is at low risk for perioperative complications.   His functional capacity is good at 5.38 METs according to the Duke Activity Status Index (DASI). Recommendations: According to ACC/AHA guidelines, no further cardiovascular testing needed.  The patient may proceed to surgery at acceptable risk.   Antiplatelet and/or Anticoagulation Recommendations:  No request to hold any cardiac meds.     The patient was advised that if he develops new symptoms prior to surgery to contact our office to arrange for a follow-up visit, and he verbalized understanding.   A copy of this note will be routed to requesting surgeon.  Time:   Today, I have spent 10 minutes with the patient with telehealth technology discussing medical history, symptoms, and management plan.     [name] JAYSON Hoover, NP  09/30/2024, 11:58 AM

## 2024-10-01 NOTE — Progress Notes (Signed)
 Request sent to Dr. Alvaro to send pre op orders for PST appointment 10/02/24.

## 2024-10-01 NOTE — Progress Notes (Addendum)
 COVID Vaccine received:  []  No [x]  Yes Date of any COVID positive Test in last 90 days: no PCP - Clemencia Hint NP Cardiologist - Redell Shallow MD  Chest x-ray -  EKG -  02/06/24 Epic Stress Test -  ECHO - 03/21/24 Epic Cardiac Cath -   Cardiac clearance- 09/30/24-Jennifer Carlin NP  Bowel Prep - [x]  No  []   Yes ______  Pacemaker / ICD device [x]  No []  Yes   Spinal Cord Stimulator:[x]  No []  Yes       History of Sleep Apnea? [x]  No []  Yes   CPAP used?- [x]  No []  Yes    Does the patient monitor blood sugar?          [x]  No []  Yes  []  N/A  Patient has: [x]  NO Hx DM   []  Pre-DM                 []  DM1  []   DM2 Does patient have a [employer] or Dexacom? []  No []  Yes   Fasting Blood Sugar Ranges-  Checks Blood Sugar _____ times a day  GLP1 agonist / usual dose - no GLP1 instructions:  SGLT-2 inhibitors / usual dose - no SGLT-2 instructions:   Blood Thinner / Instructions:no Aspirin  Instructions:ASA 81 mg- MD instructed to stop 10/04/24.  Comments:   Activity level: Patient is able  to climb a flight of stairs without difficulty; [x]  No CP  [x]  No SOB,    Patient can perform ADLs without assistance.   Anesthesia review: AAA, HTN, CHF, Cardiac clearance 09/30/24, CLLeukemia, platelets 82  Patient denies shortness of breath, fever, cough and chest pain at PAT appointment.  Patient verbalized understanding and agreement to the Pre-Surgical Instructions that were given to them at this PAT appointment. Patient was also educated of the need to review these PAT instructions again prior to his/her surgery.I reviewed the appropriate phone numbers to call if they have any and questions or concerns.

## 2024-10-01 NOTE — Patient Instructions (Signed)
 SURGICAL WAITING ROOM VISITATION  Patients having surgery or a procedure may have no more than 2 support people in the waiting area - these visitors may rotate.    Children under the age of 47 must have an adult with them who is not the patient.  Visitors with respiratory illnesses are discouraged from visiting and should remain at home.  If the patient needs to stay at the hospital during part of their recovery, the visitor guidelines for inpatient rooms apply. Pre-op nurse will coordinate an appropriate time for 1 support person to accompany patient in pre-op.  This support person may not rotate.    Please refer to the [facility] website for the visitor guidelines for Inpatients (after your surgery is over and you are in a regular room).       Your procedure is scheduled on: 10/09/24   Report to [facility] Main Entrance    Report to admitting at 1:45 PM   Call this number if you have problems the morning of surgery [phone]   Do not eat food or drink liquids after midnight:After Midnight. But may have sips of water to take meds.     Oral Hygiene is also important to reduce your risk of infection.                                    Remember - BRUSH YOUR TEETH THE MORNING OF SURGERY WITH YOUR REGULAR TOOTHPASTE  DENTURES WILL BE REMOVED PRIOR TO SURGERY PLEASE DO NOT APPLY Poly grip OR ADHESIVES!!!     Stop all vitamins and herbal supplements 7 days before surgery.   Take these medicines the morning of surgery with A SIP OF WATER: tamsulosin   Do not take lasix  or Losartan  the morning of surgery.              You may not have any metal on your body including hair pins, jewelry, and body piercing             Do not wear make-up, lotions, powders, perfumes/cologne, or deodorant               Men may shave face and neck.   Do not bring valuables to the hospital. [facility] IS NOT             RESPONSIBLE   FOR VALUABLES.   Contacts, glasses, dentures or  bridgework may not be worn into surgery.    DO NOT BRING YOUR HOME MEDICATIONS TO THE HOSPITAL. PHARMACY WILL DISPENSE MEDICATIONS LISTED ON YOUR MEDICATION LIST TO YOU DURING YOUR ADMISSION IN THE HOSPITAL!    Patients discharged on the day of surgery will not be allowed to drive home.  Someone NEEDS to stay with you for the first 24 hours after anesthesia.   Special Instructions: Bring a copy of your healthcare power of attorney and living will documents the day of surgery if you haven't scanned them before.              Please read over the following fact sheets you were given: IF YOU HAVE QUESTIONS ABOUT YOUR PRE-OP INSTRUCTIONS PLEASE CALL [phone] [place]   If you received a COVID test during your pre-op visit  it is requested that you wear a mask when out in public, stay away from anyone that may not be feeling well and notify your surgeon if you develop symptoms. If you test  positive for Covid or have been in contact with anyone that has tested positive in the last 10 days please notify you surgeon.    [facility] - Preparing for Surgery Before surgery, you can play an important role.  Because skin is not sterile, your skin needs to be as free of germs as possible.  You can reduce the number of germs on your skin by washing with CHG (chlorahexidine gluconate) soap before surgery.  CHG is an antiseptic cleaner which kills germs and bonds with the skin to continue killing germs even after washing. Please DO NOT use if you have an allergy to CHG or antibacterial soaps.  If your skin becomes reddened/irritated stop using the CHG and inform your nurse when you arrive at Short Stay. Do not shave (including legs and underarms) for at least 48 hours prior to the first CHG shower.  You may shave your face/neck.  Please follow these instructions carefully:  1.  Shower with CHG Soap the night before surgery and the morning of surgery.  2.  If you choose to wash your hair, wash your hair first  as usual with your normal  shampoo.  3.  After you shampoo, rinse your hair and body thoroughly to remove the shampoo.                             4.  Use CHG as you would any other liquid soap.  You can apply chg directly to the skin and wash.  Gently with a scrungie or clean washcloth.  5.  Apply the CHG Soap to your body ONLY FROM THE NECK DOWN.   Do   not use on face/ open                           Wound or open sores. Avoid contact with eyes, ears mouth and   genitals (private parts).                       Wash face,  Genitals (private parts) with your normal soap.             6.  Wash thoroughly, paying special attention to the area where your    surgery  will be performed.  7.  Thoroughly rinse your body with warm water from the neck down.  8.  DO NOT shower/wash with your normal soap after using and rinsing off the CHG Soap.                9.  Pat yourself dry with a clean towel.            10.  Wear clean pajamas.            11.  Place clean sheets on your bed the night of your first shower and do not  sleep with pets. Day of Surgery : Do not apply any CHG, lotions/deodorants the morning of surgery.  Please wear clean clothes to the hospital/surgery center.  FAILURE TO FOLLOW THESE INSTRUCTIONS MAY RESULT IN THE CANCELLATION OF YOUR SURGERY  PATIENT SIGNATURE_________________________________  NURSE SIGNATURE__________________________________  ________________________________________________________________________

## 2024-10-02 ENCOUNTER — Encounter (HOSPITAL_COMMUNITY): Payer: Self-pay

## 2024-10-02 ENCOUNTER — Other Ambulatory Visit: Payer: Self-pay

## 2024-10-02 ENCOUNTER — Encounter (HOSPITAL_COMMUNITY)
Admission: RE | Admit: 2024-10-02 | Discharge: 2024-10-02 | Disposition: A | Discharge status: Home / Self Care | Source: Ambulatory Visit | Attending: Urology | Admitting: Urology

## 2024-10-02 VITALS — BP 127/52 | HR 60 | Temp 97.8°F | Resp 16 | Ht 70.0 in | Wt 215.0 lb

## 2024-10-02 DIAGNOSIS — I130 Hypertensive heart and chronic kidney disease with heart failure and stage 1 through stage 4 chronic kidney disease, or unspecified chronic kidney disease: Secondary | ICD-10-CM | POA: Insufficient documentation

## 2024-10-02 DIAGNOSIS — Z01812 Encounter for preprocedural laboratory examination: Secondary | ICD-10-CM | POA: Diagnosis not present

## 2024-10-02 DIAGNOSIS — I5032 Chronic diastolic (congestive) heart failure: Secondary | ICD-10-CM | POA: Insufficient documentation

## 2024-10-02 DIAGNOSIS — N189 Chronic kidney disease, unspecified: Secondary | ICD-10-CM | POA: Insufficient documentation

## 2024-10-02 DIAGNOSIS — Z87891 Personal history of nicotine dependence: Secondary | ICD-10-CM | POA: Insufficient documentation

## 2024-10-02 DIAGNOSIS — Z01818 Encounter for other preprocedural examination: Secondary | ICD-10-CM

## 2024-10-02 DIAGNOSIS — N200 Calculus of kidney: Secondary | ICD-10-CM | POA: Insufficient documentation

## 2024-10-02 DIAGNOSIS — I10 Essential (primary) hypertension: Secondary | ICD-10-CM

## 2024-10-02 HISTORY — DX: Chronic kidney disease, unspecified: N18.9

## 2024-10-02 HISTORY — DX: Malignant (primary) neoplasm, unspecified: C80.1

## 2024-10-02 LAB — CBC
HCT: 38 % — ABNORMAL LOW (ref 39.0–52.0)
Hemoglobin: 12.2 g/dL — ABNORMAL LOW (ref 13.0–17.0)
MCH: 30.8 pg (ref 26.0–34.0)
MCHC: 32.1 g/dL (ref 30.0–36.0)
MCV: 96 fL (ref 80.0–100.0)
Platelets: 82 K/uL — ABNORMAL LOW (ref 150–400)
RBC: 3.96 MIL/uL — ABNORMAL LOW (ref 4.22–5.81)
RDW: 14.5 % (ref 11.5–15.5)
WBC: 10.9 K/uL — ABNORMAL HIGH (ref 4.0–10.5)
nRBC: 0 % (ref 0.0–0.2)

## 2024-10-02 LAB — BASIC METABOLIC PANEL WITH GFR
Anion gap: 10 (ref 5–15)
BUN: 40 mg/dL — ABNORMAL HIGH (ref 8–23)
CO2: 22 mmol/L (ref 22–32)
Calcium: 9.4 mg/dL (ref 8.9–10.3)
Chloride: 109 mmol/L (ref 98–111)
Creatinine, Ser: 1.27 mg/dL — ABNORMAL HIGH (ref 0.61–1.24)
GFR, Estimated: 58 mL/min — ABNORMAL LOW (ref >60)
Glucose, Bld: 92 mg/dL (ref 70–99)
Potassium: 4.3 mmol/L (ref 3.5–5.1)
Sodium: 142 mmol/L (ref 135–145)

## 2024-10-02 NOTE — Progress Notes (Signed)
 Request sent to Dr. Alvaro to review pt's pre op CBC-platelets drawn 10/02/24.

## 2024-10-04 NOTE — Progress Notes (Signed)
 Anesthesia Chart Review   Case: [id number] Date/Time: 10/09/24 1550   Procedures:      CYSTOSCOPY/URETEROSCOPY/HOLMIUM LASER/STENT PLACEMENT (Right)     CYSTOSCOPY, WITH RETROGRADE PYELOGRAM (Right)   Anesthesia type: General   Diagnosis: Kidney stone [N20.0]   Pre-op diagnosis: RIGHT KIDNEY STONE   [facility] 03 / [facility]   Surgeons: Alvaro Ricardo KATHEE Mickey., MD       DISCUSSION:[name] [name] with h/o HTN, CLL, HFpEF, CKD, right kidney stone scheduled for above procedure 10/09/2024 with Dr. Ricardo Alvaro.   Per cardiology preoperative evaluation 09/30/2024, [name] [name]'s perioperative risk of a major cardiac event is 0.9% according to the Revised Cardiac Risk Index (RCRI).  Therefore, he is at low risk for perioperative complications.   His functional capacity is good at 5.38 METs according to the Duke Activity Status Index (DASI). Recommendations: According to ACC/AHA guidelines, no further cardiovascular testing needed.  The patient may proceed to surgery at acceptable risk.  Pt with CLL, persistent moderate thrombocytopenia.  Follows with hematology.  Last seen 05/01/2024. Per OV note no indication for treating CLL at present. 8 month follow up recommended.   VS: BP (!) 127/52   Pulse 60   Temp 36.6 C (Oral)   Resp 16   Ht 5' 10 (1.778 m)   Wt 97.5 kg   SpO2 97%   BMI 30.85 kg/m   PROVIDERS: [name] Anthony RAMAN, FNP is PCP   Cardiologist - Redell Shallow, MD  LABS: Labs reviewed: Acceptable for surgery. (all labs ordered are listed, but only abnormal results are displayed)  Labs Reviewed  BASIC METABOLIC PANEL WITH GFR - Abnormal; Notable for the following components:      Result Value   BUN 40 (*)    Creatinine, Ser 1.27 (*)    GFR, Estimated 58 (*)    All other components within normal limits  CBC - Abnormal; Notable for the following components:   WBC 10.9 (*)    RBC 3.96 (*)    Hemoglobin 12.2 (*)    HCT 38.0 (*)    Platelets 82 (*)    All other  components within normal limits     IMAGES: VAS US  AAA Duplex 09/26/2024 Summary:  Abdominal Aorta: There is evidence of abnormal dilatation of the mid  Abdominal aorta. The largest aortic measurement is 4.8 cm. The largest  aortic diameter remains essentially unchanged compared to prior exam.  Previous diameter measurement was 4.8 cm  obtained on 03/23/2024.   EKG:   CV: Echo 03/21/2024 1. Left ventricular ejection fraction, by estimation, is 55 to 60%. The  left ventricle has normal function. The left ventricle has no regional  wall motion abnormalities. Left ventricular diastolic parameters were  grossly normal (medial e' >9 cm/s).   2. Right ventricular systolic function is normal. The right ventricular  size is normal. Tricuspid regurgitation signal is inadequate for assessing  PA pressure.   3. The mitral valve is normal in structure. Mild mitral valve  regurgitation. No evidence of mitral stenosis.   4. The aortic valve is tricuspid. Aortic valve regurgitation is not  visualized. No aortic stenosis is present.   5. The inferior vena cava is normal in size with greater than 50%  respiratory variability, suggesting right atrial pressure of 3 mmHg.   Past Medical History:  Diagnosis Date   Adrenal gland cyst    Arthritis    BIL KNEES   Cancer (HCC)    Chronic kidney disease  Diastolic heart failure, NYHA class 2 (HCC)    Eczema    Herpesviral conjunctivitis    hx of in past causing need for corneal transplant rt eye   History of kidney stones    HTN (hypertension) 05/27/2014    Past Surgical History:  Procedure Laterality Date   CORNEAL TRANSPLANT  11/07/1984   HERNIA REPAIR  1981 /2001 /2012   L ING HERNIA REPAIR X 6   PILONIDAL CYST EXCISION  11/07/1972   TONSILLECTOMY     TOTAL HIP ARTHROPLASTY Right 05/22/2014   Procedure: RIGHT TOTAL HIP ARTHROPLASTY;  Surgeon: Tanda DELENA Heading, MD;  Location: [facility];  Service: Orthopedics;  Laterality: Right;     MEDICATIONS:  acidophilus (RISAQUAD) CAPS capsule   aspirin  81 MG tablet   beta carotene w/minerals (OCUVITE) tablet   ELDERBERRY PO   ferrous gluconate (FERGON) 324 MG tablet   furosemide  (LASIX ) 40 MG tablet   losartan  (COZAAR ) 25 MG tablet   Magnesium Cl-Calcium  Carbonate (SLOW-MAG PO)   meloxicam (MOBIC) 7.5 MG tablet   Potassium Chloride  ER 20 MEQ TBCR   rosuvastatin  (CRESTOR ) 20 MG tablet   tamsulosin  (FLOMAX ) 0.4 MG CAPS capsule   vitamin C (ASCORBIC ACID) 250 MG tablet   No current facility-administered medications for this encounter.   Harlene Hoots Ward, PA-C [facility] Pre-Surgical Testing [phone]

## 2024-10-04 NOTE — Anesthesia Preprocedure Evaluation (Signed)
 Anesthesia Evaluation  Patient identified by MRN, date of birth, ID band Patient awake    Reviewed: Allergy & Precautions, NPO status , Patient's Chart, lab work & pertinent test results  Airway Mallampati: II  TM Distance: >3 FB Neck ROM: Full    Dental  (+) Teeth Intact, Chipped, Poor Dentition, Dental Advisory Given,    Pulmonary former smoker   Pulmonary exam normal breath sounds clear to auscultation       Cardiovascular hypertension, +CHF  Normal cardiovascular exam+ Valvular Problems/Murmurs MR  Rhythm:Regular Rate:Normal  Echo 03/2024  1. Left ventricular ejection fraction, by estimation, is 55 to 60%. The  left ventricle has normal function. The left ventricle has no regional  wall motion abnormalities. Left ventricular diastolic parameters were  grossly normal (medial e' >9 cm/s).   2. Right ventricular systolic function is normal. The right ventricular  size is normal. Tricuspid regurgitation signal is inadequate for assessing  PA pressure.   3. The mitral valve is normal in structure. Mild mitral valve regurgitation.  No evidence of mitral stenosis.   4. The aortic valve is tricuspid. Aortic valve regurgitation is not  visualized. No aortic stenosis is present.   5. The inferior vena cava is normal in size with greater than 50%  respiratory variability, suggesting right atrial pressure of 3 mmHg.      Neuro/Psych negative neurological ROS     GI/Hepatic negative GI ROS, Neg liver ROS,,,  Endo/Other  negative endocrine ROS    Renal/GU Renal disease     Musculoskeletal  (+) Arthritis ,    Abdominal  (+) + obese  Peds  Hematology  (+) Blood dyscrasia, anemia   Anesthesia Other Findings   Reproductive/Obstetrics                              Anesthesia Physical Anesthesia Plan  ASA: 3  Anesthesia Plan: General   Post-op Pain Management: Ofirmev  IV (intra-op)*    Induction: Intravenous  PONV Risk Score and Plan: 3 and Ondansetron , Dexamethasone and Treatment may vary due to age or medical condition  Airway Management Planned: LMA and Oral ETT  Additional Equipment:   Intra-op Plan:   Post-operative Plan: Extubation in OR  Informed Consent: I have reviewed the patients History and Physical, chart, labs and discussed the procedure including the risks, benefits and alternatives for the proposed anesthesia with the patient or authorized representative who has indicated his/her understanding and acceptance.     Dental advisory given  Plan Discussed with: CRNA  Anesthesia Plan Comments: (See PAT note 10/02/2024)         Anesthesia Quick Evaluation

## 2024-10-09 ENCOUNTER — Observation Stay (HOSPITAL_COMMUNITY): Admission: RE | Admit: 2024-10-09 | Discharge: 2024-10-10 | Disposition: A | Attending: Urology | Admitting: Urology

## 2024-10-09 ENCOUNTER — Ambulatory Visit (HOSPITAL_COMMUNITY)

## 2024-10-09 ENCOUNTER — Encounter (HOSPITAL_COMMUNITY): Payer: Self-pay | Admitting: Urology

## 2024-10-09 ENCOUNTER — Other Ambulatory Visit: Payer: Self-pay

## 2024-10-09 ENCOUNTER — Encounter (HOSPITAL_COMMUNITY): Admission: RE | Disposition: A | Payer: Self-pay | Source: Home / Self Care | Attending: Urology

## 2024-10-09 ENCOUNTER — Ambulatory Visit (HOSPITAL_COMMUNITY): Payer: Self-pay | Admitting: Medical

## 2024-10-09 DIAGNOSIS — I5030 Unspecified diastolic (congestive) heart failure: Secondary | ICD-10-CM | POA: Diagnosis not present

## 2024-10-09 DIAGNOSIS — I130 Hypertensive heart and chronic kidney disease with heart failure and stage 1 through stage 4 chronic kidney disease, or unspecified chronic kidney disease: Secondary | ICD-10-CM | POA: Diagnosis not present

## 2024-10-09 DIAGNOSIS — R338 Other retention of urine: Secondary | ICD-10-CM | POA: Diagnosis not present

## 2024-10-09 DIAGNOSIS — N189 Chronic kidney disease, unspecified: Secondary | ICD-10-CM | POA: Diagnosis not present

## 2024-10-09 DIAGNOSIS — I110 Hypertensive heart disease with heart failure: Secondary | ICD-10-CM

## 2024-10-09 DIAGNOSIS — I5031 Acute diastolic (congestive) heart failure: Secondary | ICD-10-CM | POA: Diagnosis not present

## 2024-10-09 DIAGNOSIS — Z87891 Personal history of nicotine dependence: Secondary | ICD-10-CM

## 2024-10-09 DIAGNOSIS — I5023 Acute on chronic systolic (congestive) heart failure: Secondary | ICD-10-CM | POA: Diagnosis not present

## 2024-10-09 DIAGNOSIS — N400 Enlarged prostate without lower urinary tract symptoms: Secondary | ICD-10-CM | POA: Diagnosis not present

## 2024-10-09 DIAGNOSIS — N201 Calculus of ureter: Secondary | ICD-10-CM | POA: Diagnosis not present

## 2024-10-09 DIAGNOSIS — N401 Enlarged prostate with lower urinary tract symptoms: Secondary | ICD-10-CM | POA: Diagnosis not present

## 2024-10-09 HISTORY — PX: TRANSURETHRAL RESECTION OF PROSTATE: SHX73

## 2024-10-09 HISTORY — PX: CYSTOSCOPY W/ RETROGRADES: SHX1426

## 2024-10-09 SURGERY — CYSTOSCOPY, WITH RETROGRADE PYELOGRAM
Anesthesia: General | Laterality: Right

## 2024-10-09 MED ORDER — IOHEXOL 300 MG/ML  SOLN
INTRAMUSCULAR | Status: DC | PRN
  Administered 2024-10-09: 8 mL

## 2024-10-09 MED ORDER — LIDOCAINE HCL (CARDIAC) PF 100 MG/5ML IV SOSY
PREFILLED_SYRINGE | INTRAVENOUS | Status: DC | PRN
  Administered 2024-10-09: 100 mg via INTRAVENOUS

## 2024-10-09 MED ORDER — HYDROMORPHONE HCL 1 MG/ML IJ SOLN
INTRAMUSCULAR | Status: DC | PRN
  Administered 2024-10-09: 1 mg via INTRAVENOUS

## 2024-10-09 MED ORDER — PROPOFOL 10 MG/ML IV BOLUS
INTRAVENOUS | Status: AC
  Filled 2024-10-09: qty 20

## 2024-10-09 MED ORDER — GENTAMICIN SULFATE 40 MG/ML IJ SOLN
5.0000 mg/kg | INTRAVENOUS | Status: AC
  Administered 2024-10-09: 414 mg via INTRAVENOUS
  Filled 2024-10-09: qty 10.25

## 2024-10-09 MED ORDER — PHENYLEPHRINE HCL (PRESSORS) 10 MG/ML IV SOLN
INTRAVENOUS | Status: DC | PRN
  Administered 2024-10-09: 160 ug via INTRAVENOUS

## 2024-10-09 MED ORDER — FENTANYL CITRATE (PF) 100 MCG/2ML IJ SOLN
INTRAMUSCULAR | Status: DC | PRN
  Administered 2024-10-09 (×2): 50 ug via INTRAVENOUS

## 2024-10-09 MED ORDER — GLYCOPYRROLATE 0.2 MG/ML IJ SOLN
INTRAMUSCULAR | Status: AC
  Filled 2024-10-09: qty 1

## 2024-10-09 MED ORDER — SENNOSIDES-DOCUSATE SODIUM 8.6-50 MG PO TABS
1.0000 | ORAL_TABLET | Freq: Two times a day (BID) | ORAL | Status: DC
  Administered 2024-10-09 – 2024-10-10 (×2): 1 via ORAL
  Filled 2024-10-09 (×2): qty 1

## 2024-10-09 MED ORDER — ALBUMIN HUMAN 5 % IV SOLN: INTRAVENOUS | Status: DC | PRN

## 2024-10-09 MED ORDER — SODIUM CHLORIDE 0.9 % IV SOLN: INTRAVENOUS | Status: DC

## 2024-10-09 MED ORDER — HYDROMORPHONE HCL 2 MG/ML IJ SOLN
INTRAMUSCULAR | Status: AC
  Filled 2024-10-09: qty 1

## 2024-10-09 MED ORDER — ROSUVASTATIN CALCIUM 20 MG PO TABS
20.0000 mg | ORAL_TABLET | Freq: Every day | ORAL | Status: DC
  Administered 2024-10-09: 20 mg via ORAL
  Filled 2024-10-09: qty 1

## 2024-10-09 MED ORDER — EPHEDRINE 5 MG/ML INJ
INTRAVENOUS | Status: AC
  Filled 2024-10-09: qty 5

## 2024-10-09 MED ORDER — ORAL CARE MOUTH RINSE: 15.0000 mL | Freq: Once | OROMUCOSAL | Status: AC

## 2024-10-09 MED ORDER — POTASSIUM CHLORIDE CRYS ER 20 MEQ PO TBCR
20.0000 meq | EXTENDED_RELEASE_TABLET | Freq: Every day | ORAL | Status: DC
  Administered 2024-10-10: 20 meq via ORAL
  Filled 2024-10-09: qty 1

## 2024-10-09 MED ORDER — FUROSEMIDE 40 MG PO TABS
40.0000 mg | ORAL_TABLET | Freq: Every day | ORAL | Status: DC
  Administered 2024-10-10: 40 mg via ORAL
  Filled 2024-10-09: qty 1

## 2024-10-09 MED ORDER — LACTATED RINGERS IV SOLN: INTRAVENOUS | Status: DC

## 2024-10-09 MED ORDER — HYDROMORPHONE HCL 1 MG/ML IJ SOLN: 0.5000 mg | INTRAMUSCULAR | Status: DC | PRN

## 2024-10-09 MED ORDER — EPHEDRINE SULFATE (PRESSORS) 25 MG/5ML IV SOSY
PREFILLED_SYRINGE | INTRAVENOUS | Status: DC | PRN
  Administered 2024-10-09 (×2): 10 mg via INTRAVENOUS

## 2024-10-09 MED ORDER — SODIUM CHLORIDE 0.9 % IR SOLN
3000.0000 mL | Status: DC
  Administered 2024-10-09 – 2024-10-10 (×3): 3000 mL

## 2024-10-09 MED ORDER — ONDANSETRON HCL 4 MG/2ML IJ SOLN
INTRAMUSCULAR | Status: AC
  Filled 2024-10-09: qty 2

## 2024-10-09 MED ORDER — CHLORHEXIDINE GLUCONATE 0.12 % MT SOLN
15.0000 mL | Freq: Once | OROMUCOSAL | Status: AC
  Administered 2024-10-09: 15 mL via OROMUCOSAL

## 2024-10-09 MED ORDER — SODIUM CHLORIDE 0.9 % IR SOLN
Status: DC | PRN
  Administered 2024-10-09: 21000 mL

## 2024-10-09 MED ORDER — FENTANYL CITRATE (PF) 100 MCG/2ML IJ SOLN
INTRAMUSCULAR | Status: AC
  Filled 2024-10-09: qty 2

## 2024-10-09 MED ORDER — ACETAMINOPHEN 500 MG PO TABS
1000.0000 mg | ORAL_TABLET | Freq: Four times a day (QID) | ORAL | Status: DC
  Administered 2024-10-10 (×2): 1000 mg via ORAL
  Filled 2024-10-09 (×2): qty 2

## 2024-10-09 MED ORDER — ALBUMIN HUMAN 5 % IV SOLN
INTRAVENOUS | Status: AC
  Filled 2024-10-09: qty 250

## 2024-10-09 MED ORDER — ONDANSETRON HCL 4 MG/2ML IJ SOLN
INTRAMUSCULAR | Status: DC | PRN
  Administered 2024-10-09: 4 mg via INTRAVENOUS

## 2024-10-09 MED ORDER — OXYCODONE HCL 5 MG PO TABS
5.0000 mg | ORAL_TABLET | ORAL | Status: DC | PRN
  Administered 2024-10-09: 5 mg via ORAL
  Filled 2024-10-09: qty 1

## 2024-10-09 MED ORDER — LIDOCAINE HCL (PF) 2 % IJ SOLN
INTRAMUSCULAR | Status: AC
  Filled 2024-10-09: qty 5

## 2024-10-09 MED ORDER — FINASTERIDE 5 MG PO TABS
5.0000 mg | ORAL_TABLET | Freq: Every day | ORAL | Status: DC
  Administered 2024-10-10: 5 mg via ORAL
  Filled 2024-10-09: qty 1

## 2024-10-09 MED ORDER — PROPOFOL 10 MG/ML IV BOLUS
INTRAVENOUS | Status: DC | PRN
  Administered 2024-10-09: 150 mg via INTRAVENOUS

## 2024-10-09 SURGICAL SUPPLY — 24 items
BAG URINE DRAIN 2000ML AR STRL (UROLOGICAL SUPPLIES) IMPLANT
BAG URO CATCHER STRL LF (MISCELLANEOUS) ×2 IMPLANT
BASKET LASER NITINOL 1.9FR (BASKET) IMPLANT
CATH URETL OPEN END 6FR 70 (CATHETERS) ×2 IMPLANT
CATH URTH STD 24FR FL 3W 2 (CATHETERS) IMPLANT
CLOTH BEACON ORANGE TIMEOUT ST (SAFETY) ×2 IMPLANT
GLOVE SURG LX STRL 7.5 STRW (GLOVE) ×2 IMPLANT
GOWN STRL REUS W/ TWL XL LVL3 (GOWN DISPOSABLE) ×2 IMPLANT
GUIDEWIRE ANG ZIPWIRE 038X150 (WIRE) ×2 IMPLANT
GUIDEWIRE STR DUAL SENSOR (WIRE) ×2 IMPLANT
HOLDER FOLEY CATH W/STRAP (MISCELLANEOUS) IMPLANT
IV CATH AUTO 14GX1.75 SAFE ORG (IV SOLUTION) IMPLANT
KIT TURNOVER KIT A (KITS) ×2 IMPLANT
LOOP CUT BIPOLAR 24F LRG (ELECTROSURGICAL) IMPLANT
MANIFOLD NEPTUNE II (INSTRUMENTS) ×2 IMPLANT
NS IRRIG 1000ML POUR BTL (IV SOLUTION) IMPLANT
PACK CYSTO (CUSTOM PROCEDURE TRAY) ×2 IMPLANT
SHEATH NAVIGATOR HD 11/13X28 (SHEATH) IMPLANT
SHEATH NAVIGATOR HD 11/13X36 (SHEATH) IMPLANT
SYRINGE TOOMEY IRRIG 70ML (MISCELLANEOUS) IMPLANT
TRACTIP FLEXIVA PULS ID 200XHI (Laser) IMPLANT
TUBE PU 8FR 16IN ENFIT (TUBING) ×2 IMPLANT
TUBING CONNECTING 10 (TUBING) ×2 IMPLANT
TUBING UROLOGY SET (TUBING) ×2 IMPLANT

## 2024-10-09 NOTE — H&P (Signed)
 [name] [name] is an 78 y.o. male.    Chief Complaint: Pre-OP RIGHT Ureteroscopic Stone Manipulation.   HPI:   1 - Lower Urinary Tract Symptoms / Transient Urinary Retention - transient retention 2015 after ortho surgery. Placed on BID tamsulosin . Prostae volume 51gm (age normal) 2012 at about age 40. 95 gm on CT 2025 at age 5  Recent Cousrse: 03/2024 - PVR 150 mL (age normal), DRE 50gm slight assyemtry (stable per pt x decades)  2 - Gross Hematuria / Rt Distlal Ureteral Stone - 5mm Rt distal stone on hematuria CT 2025. Cysto 05/2024 just BPH changes.   PMH sig for CHF/Lasix  (follows B. Crenshaw cards), bilateral lap inguinal hernia repair, CLL (surveillance with Dr. Cloretta med-onc). His PCP is Laurette Hint NP with [facility].  Today [name] [name] is seen to proceed with RIGHT ureteroscopy for distal stone. Continued occasional hematuria. Most recent UCX non-clonal. No interval fevers.   Past Medical History:  Diagnosis Date   Adrenal gland cyst    Arthritis    BIL KNEES   Cancer (HCC)    Chronic kidney disease    Diastolic heart failure, NYHA class 2 (HCC)    Eczema    Herpesviral conjunctivitis    hx of in past causing need for corneal transplant rt eye   History of kidney stones    HTN (hypertension) 05/27/2014    Past Surgical History:  Procedure Laterality Date   CORNEAL TRANSPLANT  11/07/1984   HERNIA REPAIR  1981 /2001 /2012   L ING HERNIA REPAIR X 6   PILONIDAL CYST EXCISION  11/07/1972   TONSILLECTOMY     TOTAL HIP ARTHROPLASTY Right 05/22/2014   Procedure: RIGHT TOTAL HIP ARTHROPLASTY;  Surgeon: Tanda DELENA Heading, MD;  Location: [facility];  Service: Orthopedics;  Laterality: Right;    Family History  Problem Relation Age of Onset   Alzheimer's disease Mother    Cancer - Other Father        cancer of the uvula   Lung cancer Other    Social History:  reports that he quit smoking about 13 years ago. His smoking use included cigarettes. He has never used smokeless tobacco. He  reports current alcohol use. He reports that he does not use drugs.  Allergies:  Allergies  Allergen Reactions   Prednisone     Leg cramps   Penicillins Rash    No medications prior to admission.    No results found for this or any previous visit (from the past 48 hours). No results found.  Review of Systems  Constitutional:  Negative for chills and fever.  All other systems reviewed and are negative.   There were no vitals taken for this visit. Physical Exam Vitals reviewed.  HENT:     Head: Normocephalic.  Eyes:     Pupils: Pupils are equal, round, and reactive to light.  Cardiovascular:     Rate and Rhythm: Normal rate.  Pulmonary:     Effort: Pulmonary effort is normal.  Abdominal:     General: Abdomen is flat.  Genitourinary:    Comments: No CVAT at present Musculoskeletal:        General: Normal range of motion.     Cervical back: Normal range of motion.  Skin:    General: Skin is warm.  Neurological:     General: No focal deficit present.     Mental Status: He is alert.  Psychiatric:        Mood and Affect: Mood normal.  Assessment/Plan  Proceed as planned with RIGHT ureteroscopic stone manipulation. Risks, benefits, alternatives, expected peri-op course discussed previously and reiterated today.   [name] [name]., MD 10/09/2024, 8:18 AM

## 2024-10-09 NOTE — Transfer of Care (Signed)
 Immediate Anesthesia Transfer of Care Note  Patient: [name] [name]  Procedure(s) Performed: CYSTOSCOPY, WITH RETROGRADE PYELOGRAM (Right) TURP (TRANSURETHRAL RESECTION OF PROSTATE)  Patient Location: PACU  Anesthesia Type:General  Level of Consciousness: awake, alert , and oriented  Airway & Oxygen Therapy: Patient Spontanous Breathing and Patient connected to face mask oxygen  Post-op Assessment: Report given to RN and Post -op Vital signs reviewed and stable  Post vital signs: Reviewed and stable  Last Vitals:  Vitals Value Taken Time  BP 121/63 10/09/24 18:20  Temp    Pulse 83 10/09/24 18:22  Resp 9 10/09/24 18:22  SpO2 100 % 10/09/24 18:22  Vitals shown include unfiled device data.  Last Pain:  Vitals:   10/09/24 1445  TempSrc: Oral  PainSc: 0-No pain         Complications: No notable events documented.

## 2024-10-09 NOTE — Anesthesia Procedure Notes (Signed)
 Procedure Name: LMA Insertion Date/Time: 10/09/2024 5:06 PM  Performed by: Lanning Cena RAMAN, CRNAPre-anesthesia Checklist: Patient identified, Emergency Drugs available, Suction available and Patient being monitored Patient Re-evaluated:Patient Re-evaluated prior to induction Oxygen Delivery Method: Circle system utilized Preoxygenation: Pre-oxygenation with 100% oxygen Induction Type: IV induction Ventilation: Mask ventilation without difficulty LMA: LMA inserted LMA Size: 4.0 Number of attempts: 1 Placement Confirmation: positive ETCO2 Tube secured with: Tape Dental Injury: Teeth and Oropharynx as per pre-operative assessment

## 2024-10-09 NOTE — Brief Op Note (Signed)
 10/09/2024  6:09 PM  PATIENT:  [name] [name]  78 y.o. male  PRE-OPERATIVE DIAGNOSIS:  RIGHT KIDNEY STONE  POST-OPERATIVE DIAGNOSIS:  RIGHT KIDNEY STONE, MASSIVE PROSTATE  PROCEDURE:  Procedure(s): CYSTOSCOPY, WITH RETROGRADE PYELOGRAM (Right) TURP (TRANSURETHRAL RESECTION OF PROSTATE)  SURGEON:  Surgeons and Role:    * Manny, Ricardo KATHEE Raddle., MD - Primary  PHYSICIAN ASSISTANT:   ASSISTANTS: none   ANESTHESIA:   general  EBL:  50 mL   BLOOD ADMINISTERED:none  DRAINS: 39F 3 way foley to NS irrigation   LOCAL MEDICATIONS USED:  NONE  SPECIMEN:  Source of Specimen:  prostate chips  DISPOSITION OF SPECIMEN:  PATHOLOGY  COUNTS:  YES  TOURNIQUET:  * No tourniquets in log *  DICTATION: .Other Dictation: Dictation Number [id number]  PLAN OF CARE: Admit for overnight observation  PATIENT DISPOSITION:  PACU - hemodynamically stable.   Delay start of Pharmacological VTE agent (>24hrs) due to surgical blood loss or risk of bleeding: yes

## 2024-10-09 NOTE — Op Note (Unsigned)
 NAME: [name] [name], [facility] MEDICAL RECORD NO: [id number] ACCOUNT NO: [id number] DATE OF BIRTH: August 11, 1946 FACILITY: [facility] LOCATION: [id number] PHYSICIAN: Ricardo Likens, MD  Operative Report   DATE OF PROCEDURE: 10/09/2024  SURGEON:  Ricardo Likens, MD  PREOPERATIVE DIAGNOSIS:  Right distal ureteral stone.  POSTOPERATIVE DIAGNOSES:   1.  Right distal ureteral stone. 2.  Massive prostate. 3.  History of urinary retention.  PROCEDURES PERFORMED:   1.  Cystoscopy. 2.  Right retrograde pyelogram. 3.  Transurethral resection of the prostate.  ESTIMATED BLOOD LOSS:  100 mL.  COMPLICATIONS:  None.  SPECIMENS:  Prostate sent for permanent pathology.  FINDINGS: 1.  Massive prostatic hypertrophy.  2.  Inability to cannulate right ureteral orifice due to prostatic obstruction. 3.  Wide open prostatic fossa from the bladder neck to the verumontanum following transurethral resection. 4.  Right ureteral stone remains in situ. 5.  A 21, 24-French three-way Foley catheter on normal saline irrigation.  INDICATIONS:  The patient is a pleasant 78 year old man with remote history of a CVA and known very large prostate with history of urinary retention, who is on alpha blockers for this.  He was found on workup of hematuria to have a right distal ureteral  stone.  Minimal flank pain.  Options were discussed including recommended path of ureteroscopy.  He presents for this today and informed consent was obtained and placed in the medical record.  DESCRIPTION OF PROCEDURE:  Patient identified and verified and procedure being right ureteroscopy with stone manipulation was confirmed.  Procedure timeout was performed.  Intravenous IV antibiotics were administered.  General LMA anesthesia was  introduced.  The patient was placed into a low lithotomy position.  Sterile field was created prepping and draping the patient's penis, perineum and proximal thighs using iodine.  Cystourethroscopy was performed using a  21-French rigid cystoscope with  offset lens.  Inspection of the anterior and posterior urethra revealed massive bilobar prostatic hypertrophy with incredible friability.  Just even one pass of the cystoscope generating significant blood from the prostatic fossa.  The bladder was quite  trabeculated.  Due to the massive prostate and angulation, ureteral orifices were completely non-visible whatsoever and multiple angulations attempts to visualize the ureteral orifice resulted in significant bleeding from the area of the prostate.  It  was clearly felt that ureteroscopy would frankly be impossible or hazardous.  It was also felt that the patient clearly would benefit from some volumetric prostate size reduction either with aggressive TURP versus simple prostatectomy to even allow for  ureteroscopy in the future.  I called the patient's wife on the phone intraoperatively to discuss the findings and options of abandoning the procedure today versus proceeding with a transurethral resection and likely stone manipulation in a staged  fashion.  And it was felt that this was most reasonable and we both agreed to proceed.  As such, the urethra was then calibrated using meatal sounds from 22-French to 28-French.  Using a 26-French resectoscope sheath with its obturator, the bladder was  once again visualized.  Using a medium-sized resectoscope loop, transurethral resection was performed in a top-down fashion.  First the 12 o'clock position down to the superficial prostatic capsule from the bladder neck to the verumontanum.  Then of the  right lobe of the prostate from the 12 o'clock to the 6 o'clock position.  And then of the left lobe of the prostate from the 12 o'clock to the 6 o'clock position, taking exquisite care to avoid undermining the bladder neck or  resection distal to the  veru.  This resulted in innumerable prostate chips which were irrigated and set aside for permanent pathology.  Following transurethral  resection, the trigone and ureteral orifices were able to be visualized.  And attention was turned to get a right  retrograde pyelogram obtained.  And a right retrograde pyelogram was obtained.  Right retrograde pyelogram demonstrated a single right ureter with distal stone.  However given the large volume of prostate resection and the significant hematuria from this, visualization was essentially  impossible to perform safe ureteroscopy.  The safest means of management would be to maximally fulgurate the fossa for hemostasis, place a three-way irrigation catheter and allow the patient to heal from his transurethral resection, then consider right  ureteroscopy at a separate time.  As such, the entire prostatic fossa was fulgurated in a descending spiral fashion which resulted in quite good hemostasis.  A sensor wire was advanced to the level of the urinary bladder over which a 24-French three-way  Foley catheter was carefully placed using the Seldinger technique to minimize trauma to the bladder neck.  30 mL of sterile water was placed in the balloon.  This was connected to normal saline irrigation and catheter was dropped to traction.  The  procedure was terminated.  Patient tolerated the procedure well with no immediate peri-procedural complications.  Patient taken to post-anesthesia care unit in stable condition.  Plan for observation and admission.  I will discuss the intraoperative  findings, rationale for change and recommended path of ureteroscopy in a staged fashion at a later date with the patient and his family.    MUK D: 10/09/2024 6:16:28 pm T: 10/09/2024 10:36:00 pm  JOB: [id number]/ [id number]

## 2024-10-09 NOTE — Anesthesia Postprocedure Evaluation (Signed)
 Anesthesia Post Note  Patient: [name] [name]  Procedure(s) Performed: CYSTOSCOPY, WITH RETROGRADE PYELOGRAM (Right) TURP (TRANSURETHRAL RESECTION OF PROSTATE)     Patient location during evaluation: PACU Anesthesia Type: General Level of consciousness: sedated and patient cooperative Pain management: pain level controlled Vital Signs Assessment: post-procedure vital signs reviewed and stable Respiratory status: spontaneous breathing Cardiovascular status: stable Anesthetic complications: no   No notable events documented.  Last Vitals:  Vitals:   10/09/24 1915 10/09/24 1930  BP: (!) 109/58 (!) 113/53  Pulse: 67 73  Resp: 10 11  Temp:    SpO2: 96% 98%    Last Pain:  Vitals:   10/09/24 1900  TempSrc:   PainSc: 1                  Norleen Pope

## 2024-10-09 NOTE — Discharge Instructions (Signed)
 1 - You may have urinary urgency (bladder spasms) and bloody urine on / off with stent in place. This is normal.  2 - Call MD or go to ER for fever >102, severe pain / nausea / vomiting not relieved by medications, or acute change in medical status

## 2024-10-10 ENCOUNTER — Encounter (HOSPITAL_COMMUNITY): Payer: Self-pay | Admitting: Urology

## 2024-10-10 DIAGNOSIS — R338 Other retention of urine: Secondary | ICD-10-CM | POA: Diagnosis not present

## 2024-10-10 DIAGNOSIS — N189 Chronic kidney disease, unspecified: Secondary | ICD-10-CM | POA: Diagnosis not present

## 2024-10-10 DIAGNOSIS — N400 Enlarged prostate without lower urinary tract symptoms: Secondary | ICD-10-CM | POA: Diagnosis not present

## 2024-10-10 DIAGNOSIS — N401 Enlarged prostate with lower urinary tract symptoms: Secondary | ICD-10-CM | POA: Diagnosis not present

## 2024-10-10 DIAGNOSIS — N201 Calculus of ureter: Secondary | ICD-10-CM | POA: Diagnosis not present

## 2024-10-10 DIAGNOSIS — I5030 Unspecified diastolic (congestive) heart failure: Secondary | ICD-10-CM | POA: Diagnosis not present

## 2024-10-10 DIAGNOSIS — I130 Hypertensive heart and chronic kidney disease with heart failure and stage 1 through stage 4 chronic kidney disease, or unspecified chronic kidney disease: Secondary | ICD-10-CM | POA: Diagnosis not present

## 2024-10-10 DIAGNOSIS — Z87891 Personal history of nicotine dependence: Secondary | ICD-10-CM | POA: Diagnosis not present

## 2024-10-10 LAB — BASIC METABOLIC PANEL WITH GFR
Anion gap: 7 (ref 5–15)
BUN: 25 mg/dL — ABNORMAL HIGH (ref 8–23)
CO2: 26 mmol/L (ref 22–32)
Calcium: 8.5 mg/dL — ABNORMAL LOW (ref 8.9–10.3)
Chloride: 107 mmol/L (ref 98–111)
Creatinine, Ser: 1.05 mg/dL (ref 0.61–1.24)
GFR, Estimated: >60 mL/min (ref >60)
Glucose, Bld: 110 mg/dL — ABNORMAL HIGH (ref 70–99)
Potassium: 4.5 mmol/L (ref 3.5–5.1)
Sodium: 141 mmol/L (ref 135–145)

## 2024-10-10 LAB — HEMOGLOBIN AND HEMATOCRIT, BLOOD
HCT: 34.6 % — ABNORMAL LOW (ref 39.0–52.0)
Hemoglobin: 10.9 g/dL — ABNORMAL LOW (ref 13.0–17.0)

## 2024-10-10 MED ORDER — FINASTERIDE 5 MG PO TABS: 5.0000 mg | ORAL_TABLET | Freq: Every day | ORAL | 3 refills | Status: AC

## 2024-10-10 MED ORDER — TRAMADOL HCL 50 MG PO TABS: 50.0000 mg | ORAL_TABLET | Freq: Four times a day (QID) | ORAL | 0 refills | Status: AC | PRN

## 2024-10-10 MED ORDER — SULFAMETHOXAZOLE-TRIMETHOPRIM 800-160 MG PO TABS: 1.0000 | ORAL_TABLET | Freq: Every day | ORAL | 0 refills | Status: AC

## 2024-10-10 MED ORDER — SENNOSIDES-DOCUSATE SODIUM 8.6-50 MG PO TABS: 1.0000 | ORAL_TABLET | Freq: Two times a day (BID) | ORAL | 0 refills | Status: AC

## 2024-10-10 NOTE — Progress Notes (Signed)
 AVS and discharge instructions reviewed w/ patient and spouse. Extra foley catheter supplies and education provided to patient and spouse. Both parties verbalized understanding.

## 2024-10-10 NOTE — Discharge Summary (Signed)
 Physician Discharge Summary  Patient ID: [name] [name] MRN: [id number] DOB/AGE: 1946-02-20 78 y.o.  Admit date: 10/09/2024 Discharge date: 10/10/2024  Admission Diagnoses: RIGHT URETERAL STONE  Discharge Diagnoses:  Principal Problem:   Prostatic hyperplasia   Discharged Condition: good  Hospital Course:  Pt underwent transurethral resection of prostate 10/09/2024 in aborted uretroscopy as prostate very large and precluded visualization of ureteral orifices. He has history of prior urinary retention and known BPH on meds. By the afernoon of POD 1, the day of discharge, he is ambulatory, pain controlled on PO meds, maintaining PO nutrition and felt to be aequate for discharge. He will go home with catheter, irrigation was weaned to off by discharge. Hgb 10.9, Cr 1.05, surgical pathology pending at discharge.  Consults: None  Significant Diagnostic Studies: labs: as per above  Treatments: surgery: as per above  Discharge Exam: Blood pressure (!) 111/55, pulse (!) 54, temperature 98.4 F (36.9 C), temperature source Oral, resp. rate 20, height 5' 10 (1.778 m), weight 97.5 kg, SpO2 98%.  NAD Stable disconjugate gaze NLB-RA SNTND 3 way foley in place with medium yellow urine OFF irrigation. UNcirc'd no paraphimosis, foreskin reduced.  NO c/c/e  Disposition:      Follow-up Information     [facility] Follow up on 10/16/2024.   Why: at 10:45 for nurse practioner visit. Contact information: [address] [place]  [address] [phone]                Signed: [name]. 10/10/2024, 12:55 PM

## 2024-10-10 NOTE — Progress Notes (Signed)
 Mobility Specialist - Progress Note   10/10/24 0957  Mobility  Activity Ambulated with assistance  Level of Assistance Standby assist, set-up cues, supervision of patient - no hands on  Assistive Device Front wheel walker  Distance Ambulated (ft) 350 ft  Range of Motion/Exercises Active  Activity Response Tolerated well  Mobility visit 1 Mobility  Mobility Specialist Start Time (ACUTE ONLY) 0945  Mobility Specialist Stop Time (ACUTE ONLY) 0957  Mobility Specialist Time Calculation (min) (ACUTE ONLY) 12 min   Pt was found in bed and agreeable to mobilize. No complaints. At EOS returned to bed with all needs met. Call bell in reach.   Erminio Leos,  Mobility Specialist Can be reached via Secure Chat

## 2024-10-11 DIAGNOSIS — N400 Enlarged prostate without lower urinary tract symptoms: Secondary | ICD-10-CM | POA: Diagnosis not present

## 2024-10-11 LAB — SURGICAL PATHOLOGY

## 2024-10-14 DIAGNOSIS — N201 Calculus of ureter: Secondary | ICD-10-CM | POA: Diagnosis not present

## 2024-10-14 DIAGNOSIS — R3915 Urgency of urination: Secondary | ICD-10-CM | POA: Diagnosis not present

## 2024-11-18 ENCOUNTER — Encounter: Payer: Self-pay | Admitting: Physician Assistant

## 2024-11-18 ENCOUNTER — Ambulatory Visit: Admitting: Physician Assistant

## 2024-11-18 DIAGNOSIS — M1711 Unilateral primary osteoarthritis, right knee: Secondary | ICD-10-CM

## 2024-11-18 NOTE — Progress Notes (Signed)
 HPI: [name] [name] comes in today to discuss right knee replacement.  On radiographs at prior visit he had significant varus malalignment with bone-on-bone wear of the medial compartment and patellofemoral joint both knees.  Osteophytes involving all 3 compartments right knee.  He is currently dealing with kidney stones had 1 procedure performed it sounds as portion of his prostate was removed.  Then a time to undergo surgery to remove the kidney stones.  He is asking how long after surgery he would need to wait to schedule the right knee replacement.  He denies any fevers chills chest pain shortness of breath.  He does see Dr. Pietro for his cardiac issues.  He has been cleared recently for urologic surgeries under general anesthesia.  He denies any ongoing infections.  He has on aspirin  81 mg once daily.  Review of systems: See HPI otherwise negative  Physical exam: General well-developed well-nourished male no acute distress mood and affect appropriate.  Ambulates with an antalgic gait without any assistive device.  Right knee: Good range of motion of the knee.  Varus malalignment.  Impression: Right knee osteoarthritis  Plan: Would have him touch base with Dr. Pietro for cardiac clearance.  Explained to them that we are about 6 to 8 weeks out from surgery once he says proceed with the scheduling.  Questions were encouraged and answered.  Risk benefits surgery discussed postoperative protocol discussed.  Risk including are discussed but are not limited to DVT/PE, wound healing problems, infection, and blood loss.  He will follow-up postop 2 weeks.  Recommend surgery at [facility] long given his urological issues in the past.

## 2024-11-29 ENCOUNTER — Telehealth (HOSPITAL_BASED_OUTPATIENT_CLINIC_OR_DEPARTMENT_OTHER): Payer: Self-pay | Admitting: *Deleted

## 2024-11-29 NOTE — Telephone Encounter (Signed)
 Left message for pt to call back to preop  to discuss if he wants to proceed with a tele appt and keep his appt with MD as planned as this is his f/u appt or does pt want to wait until he see's Md in march for the preop clearance.

## 2024-11-29 NOTE — Telephone Encounter (Signed)
 Call pt to ask for a sooner appointment and he states he would rather wait  until his office visit.

## 2024-11-29 NOTE — Telephone Encounter (Signed)
" ° °  Name: [name] [name]  DOB: 07-17-1946  MRN: [id number]  Primary Cardiologist: Redell Shallow, MD   Preoperative team, please contact this patient and set up a phone call appointment for further preoperative risk assessment. Please obtain consent and complete medication review. Thank you for your help.  Of note- patient does have in-office appointment with Dr. Shallow on 01/31/25. If patient is willing to have surgery after that appointment, he would not need phone call visit. Instead could use 3/27 appointment as preop eval.   I also confirmed the patient resides in the state of [place] . As per [facility] Medical Board telemedicine laws, the patient must reside in the state in which the provider is licensed.   Rollo FABIENE Louder, PA-C 11/29/2024, 9:43 AM [facility]    "

## 2024-11-29 NOTE — Telephone Encounter (Signed)
"  ° °  Pre-operative Risk Assessment    Patient Name: [name] [name]  DOB: November 13, 1945 MRN: [id number]   Date of last office visit: 02/06/24 DR. CRENSHAW; LAST IN OFFICE APPT  Date of next office visit: 01/31/25 DR. CRENSHAW   Request for Surgical Clearance    Procedure:  RIGHT TOTAL KNEE ARTHROPLASTY  Date of Surgery:  Clearance TBD                                Surgeon:  DR. LONNI POLI Surgeon's Group or Practice Name:  [facility] Phone number:  [phone] Fax number:  [phone] ATTN: JULIANNA BILLINGS   Type of Clearance Requested:   - Medical    Type of Anesthesia:  Spinal & BLOCK   Additional requests/questions:    [name] [name]   11/29/2024, 9:25 AM   "

## 2025-01-01 ENCOUNTER — Ambulatory Visit: Admitting: Oncology

## 2025-01-01 ENCOUNTER — Other Ambulatory Visit

## 2025-01-31 ENCOUNTER — Ambulatory Visit: Admitting: Cardiology
